# Patient Record
Sex: Male | Born: 1980
Health system: Southern US, Community
[De-identification: ages and names within clinical notes are randomized; demographics above are authoritative.]

## PROBLEM LIST (undated history)

## (undated) DIAGNOSIS — K760 Fatty (change of) liver, not elsewhere classified: Secondary | ICD-10-CM

## (undated) DIAGNOSIS — I10 Essential (primary) hypertension: Secondary | ICD-10-CM

## (undated) DIAGNOSIS — F411 Generalized anxiety disorder: Secondary | ICD-10-CM

## (undated) DIAGNOSIS — E119 Type 2 diabetes mellitus without complications: Secondary | ICD-10-CM

## (undated) DIAGNOSIS — E785 Hyperlipidemia, unspecified: Secondary | ICD-10-CM

## (undated) DIAGNOSIS — F988 Other specified behavioral and emotional disorders with onset usually occurring in childhood and adolescence: Secondary | ICD-10-CM

## (undated) DIAGNOSIS — R748 Abnormal levels of other serum enzymes: Secondary | ICD-10-CM

## (undated) HISTORY — DX: Type 2 diabetes mellitus without complications: E11.9

## (undated) HISTORY — DX: Abnormal levels of other serum enzymes: R74.8

---
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
## 2000-11-27 ENCOUNTER — Ambulatory Visit (HOSPITAL_COMMUNITY): Admission: RE | Admit: 2000-11-27 | Discharge: 2000-11-27 | Payer: Self-pay | Admitting: Family Medicine

## 2000-11-27 ENCOUNTER — Encounter: Payer: Self-pay | Admitting: Family Medicine

## 2003-09-20 ENCOUNTER — Emergency Department (HOSPITAL_COMMUNITY): Admission: EM | Admit: 2003-09-20 | Discharge: 2003-09-20 | Payer: Self-pay | Admitting: Emergency Medicine

## 2005-02-13 ENCOUNTER — Ambulatory Visit (HOSPITAL_COMMUNITY): Admission: RE | Admit: 2005-02-13 | Discharge: 2005-02-13 | Payer: Self-pay | Admitting: Family Medicine

## 2015-02-22 DIAGNOSIS — E785 Hyperlipidemia, unspecified: Secondary | ICD-10-CM

## 2015-02-22 DIAGNOSIS — E1169 Type 2 diabetes mellitus with other specified complication: Secondary | ICD-10-CM | POA: Insufficient documentation

## 2015-02-22 DIAGNOSIS — I10 Essential (primary) hypertension: Secondary | ICD-10-CM | POA: Insufficient documentation

## 2015-02-22 DIAGNOSIS — E1159 Type 2 diabetes mellitus with other circulatory complications: Secondary | ICD-10-CM | POA: Insufficient documentation

## 2015-02-22 DIAGNOSIS — F988 Other specified behavioral and emotional disorders with onset usually occurring in childhood and adolescence: Secondary | ICD-10-CM

## 2015-02-22 HISTORY — DX: Hyperlipidemia, unspecified: E78.5

## 2015-02-22 HISTORY — DX: Other specified behavioral and emotional disorders with onset usually occurring in childhood and adolescence: F98.8

## 2015-03-23 DIAGNOSIS — E663 Overweight: Secondary | ICD-10-CM | POA: Insufficient documentation

## 2015-03-23 DIAGNOSIS — K760 Fatty (change of) liver, not elsewhere classified: Secondary | ICD-10-CM

## 2015-03-23 DIAGNOSIS — E1165 Type 2 diabetes mellitus with hyperglycemia: Secondary | ICD-10-CM | POA: Insufficient documentation

## 2015-03-23 DIAGNOSIS — E669 Obesity, unspecified: Secondary | ICD-10-CM | POA: Insufficient documentation

## 2015-03-23 DIAGNOSIS — E785 Hyperlipidemia, unspecified: Secondary | ICD-10-CM

## 2015-03-23 DIAGNOSIS — E1169 Type 2 diabetes mellitus with other specified complication: Secondary | ICD-10-CM | POA: Insufficient documentation

## 2015-03-23 HISTORY — DX: Fatty (change of) liver, not elsewhere classified: K76.0

## 2016-02-15 DIAGNOSIS — F411 Generalized anxiety disorder: Secondary | ICD-10-CM | POA: Insufficient documentation

## 2016-02-15 HISTORY — DX: Generalized anxiety disorder: F41.1

## 2017-07-12 DIAGNOSIS — Z0000 Encounter for general adult medical examination without abnormal findings: Secondary | ICD-10-CM | POA: Diagnosis not present

## 2017-07-12 DIAGNOSIS — E785 Hyperlipidemia, unspecified: Secondary | ICD-10-CM | POA: Diagnosis not present

## 2017-07-12 DIAGNOSIS — R7309 Other abnormal glucose: Secondary | ICD-10-CM | POA: Diagnosis not present

## 2017-08-13 DIAGNOSIS — K760 Fatty (change of) liver, not elsewhere classified: Secondary | ICD-10-CM | POA: Diagnosis not present

## 2017-08-13 DIAGNOSIS — E785 Hyperlipidemia, unspecified: Secondary | ICD-10-CM | POA: Diagnosis not present

## 2017-08-13 DIAGNOSIS — I10 Essential (primary) hypertension: Secondary | ICD-10-CM | POA: Diagnosis not present

## 2017-08-13 DIAGNOSIS — F900 Attention-deficit hyperactivity disorder, predominantly inattentive type: Secondary | ICD-10-CM | POA: Diagnosis not present

## 2017-08-13 DIAGNOSIS — E1169 Type 2 diabetes mellitus with other specified complication: Secondary | ICD-10-CM | POA: Diagnosis not present

## 2017-10-31 DIAGNOSIS — J019 Acute sinusitis, unspecified: Secondary | ICD-10-CM | POA: Diagnosis not present

## 2017-10-31 DIAGNOSIS — I10 Essential (primary) hypertension: Secondary | ICD-10-CM | POA: Diagnosis not present

## 2017-10-31 DIAGNOSIS — J029 Acute pharyngitis, unspecified: Secondary | ICD-10-CM | POA: Diagnosis not present

## 2018-02-06 ENCOUNTER — Emergency Department (HOSPITAL_COMMUNITY): Payer: BLUE CROSS/BLUE SHIELD

## 2018-02-06 ENCOUNTER — Encounter (HOSPITAL_COMMUNITY): Payer: Self-pay | Admitting: Emergency Medicine

## 2018-02-06 ENCOUNTER — Emergency Department (HOSPITAL_COMMUNITY)
Admission: EM | Admit: 2018-02-06 | Discharge: 2018-02-06 | Disposition: A | Discharge status: Home / Self Care | Payer: BLUE CROSS/BLUE SHIELD | Attending: Emergency Medicine | Admitting: Emergency Medicine

## 2018-02-06 ENCOUNTER — Other Ambulatory Visit: Payer: Self-pay

## 2018-02-06 DIAGNOSIS — I10 Essential (primary) hypertension: Secondary | ICD-10-CM | POA: Diagnosis not present

## 2018-02-06 DIAGNOSIS — R42 Dizziness and giddiness: Secondary | ICD-10-CM | POA: Diagnosis not present

## 2018-02-06 DIAGNOSIS — N281 Cyst of kidney, acquired: Secondary | ICD-10-CM | POA: Diagnosis not present

## 2018-02-06 DIAGNOSIS — R509 Fever, unspecified: Secondary | ICD-10-CM | POA: Diagnosis not present

## 2018-02-06 DIAGNOSIS — R161 Splenomegaly, not elsewhere classified: Secondary | ICD-10-CM | POA: Insufficient documentation

## 2018-02-06 DIAGNOSIS — M546 Pain in thoracic spine: Secondary | ICD-10-CM | POA: Diagnosis not present

## 2018-02-06 DIAGNOSIS — R5383 Other fatigue: Secondary | ICD-10-CM | POA: Diagnosis not present

## 2018-02-06 HISTORY — DX: Essential (primary) hypertension: I10

## 2018-02-06 LAB — COMPREHENSIVE METABOLIC PANEL
ALK PHOS: 52 U/L (ref 38–126)
ALT: 100 U/L — AB (ref 0–44)
AST: 96 U/L — AB (ref 15–41)
Albumin: 3.9 g/dL (ref 3.5–5.0)
Anion gap: 9 (ref 5–15)
BUN: 8 mg/dL (ref 6–20)
CALCIUM: 9 mg/dL (ref 8.9–10.3)
CHLORIDE: 98 mmol/L (ref 98–111)
CO2: 27 mmol/L (ref 22–32)
CREATININE: 1.12 mg/dL (ref 0.61–1.24)
GFR calc non Af Amer: >60 mL/min (ref >60)
GLUCOSE: 129 mg/dL — AB (ref 70–99)
Potassium: 3.4 mmol/L — ABNORMAL LOW (ref 3.5–5.1)
SODIUM: 134 mmol/L — AB (ref 135–145)
Total Bilirubin: 1.3 mg/dL — ABNORMAL HIGH (ref 0.3–1.2)
Total Protein: 7.4 g/dL (ref 6.5–8.1)

## 2018-02-06 LAB — CBC WITH DIFFERENTIAL/PLATELET
BASOS ABS: 0.1 10*3/uL (ref 0.0–0.1)
BASOS PCT: 1 %
Eosinophils Absolute: 0.2 10*3/uL (ref 0.0–0.7)
Eosinophils Relative: 3 %
HEMATOCRIT: 42.3 % (ref 39.0–52.0)
HEMOGLOBIN: 14.4 g/dL (ref 13.0–17.0)
LYMPHS PCT: 45 %
Lymphs Abs: 2.7 10*3/uL (ref 0.7–4.0)
MCH: 29.8 pg (ref 26.0–34.0)
MCHC: 34 g/dL (ref 30.0–36.0)
MCV: 87.6 fL (ref 78.0–100.0)
MONO ABS: 0.6 10*3/uL (ref 0.1–1.0)
MONOS PCT: 9 %
NEUTROS ABS: 2.5 10*3/uL (ref 1.7–7.7)
NEUTROS PCT: 42 %
Platelets: 209 10*3/uL (ref 150–400)
RBC: 4.83 MIL/uL (ref 4.22–5.81)
RDW: 14.2 % (ref 11.5–15.5)
WBC: 6 10*3/uL (ref 4.0–10.5)

## 2018-02-06 LAB — URINALYSIS, ROUTINE W REFLEX MICROSCOPIC
Bilirubin Urine: NEGATIVE
GLUCOSE, UA: NEGATIVE mg/dL
Hgb urine dipstick: NEGATIVE
Ketones, ur: 5 mg/dL — AB
LEUKOCYTES UA: NEGATIVE
NITRITE: NEGATIVE
PH: 5 (ref 5.0–8.0)
PROTEIN: NEGATIVE mg/dL
Specific Gravity, Urine: 1.024 (ref 1.005–1.030)

## 2018-02-06 LAB — MONONUCLEOSIS SCREEN: Mono Screen: NEGATIVE

## 2018-02-06 LAB — LIPASE, BLOOD: LIPASE: 36 U/L (ref 11–51)

## 2018-02-06 MED ORDER — POVIDONE-IODINE 10 % EX SOLN
CUTANEOUS | Status: AC
  Filled 2018-02-06: qty 15

## 2018-02-06 MED ORDER — HYDROCODONE-ACETAMINOPHEN 5-325 MG PO TABS: 1.0000 | ORAL_TABLET | ORAL | 0 refills | Status: DC | PRN

## 2018-02-06 MED ORDER — IOPAMIDOL (ISOVUE-300) INJECTION 61%
100.0000 mL | Freq: Once | INTRAVENOUS | Status: AC | PRN
  Administered 2018-02-06: 100 mL via INTRAVENOUS

## 2018-02-06 MED ORDER — LIDOCAINE HCL (PF) 1 % IJ SOLN
INTRAMUSCULAR | Status: AC
  Filled 2018-02-06: qty 5

## 2018-02-06 NOTE — ED Notes (Signed)
Patient transported to CT 

## 2018-02-06 NOTE — ED Triage Notes (Addendum)
Pt reports lower back pain, left upper quadrant pain, intermittent dizziness, fever x2 weeks. Pt sent from pcp office. Highest reported 103.4. Last dose of tylenol 7am this am.  pcp office collected urine sample and pt reports "was negative".

## 2018-02-06 NOTE — ED Notes (Signed)
Patient reports he began feeling ill approx 10 days ago. C/O generalized weakness and fatigue. Has had fever since Tuesday a week ago. Patient was seen by PCP this am and sent for evaluation. Lungs sounds clear, denies cough. Patient with CVA tenderness, particularly on the L. Tender to abdominal palpation in the LUQ. Denies blood in urine or burning with urination.

## 2018-02-06 NOTE — ED Provider Notes (Signed)
[facility] EMERGENCY DEPARTMENT Provider Note   CSN: [id number] Arrival date & time: 02/06/18  1032     History   Chief Complaint Chief Complaint  Patient presents with  . Fever    HPI Randall Murphy is a 37 y.o. male.  The history is provided by the patient. No language interpreter was used.  Fever   This is a new problem. The problem occurs constantly. Progression since onset: 2 weeks. The maximum temperature noted was 100 to 100.9 F. Pertinent negatives include no chest pain, no sore throat and no cough. He has tried nothing for the symptoms. The treatment provided moderate relief.    Past Medical History:  Diagnosis Date  . Hypertension     There are no active problems to display for this patient.   History reviewed. No pertinent surgical history.      Home Medications    Prior to Admission medications   Medication Sig Start Date End Date Taking? Authorizing Provider  HYDROcodone-acetaminophen (NORCO/VICODIN) 5-325 MG tablet Take 1 tablet by mouth every 4 (four) hours as needed. 02/06/18   Elson Areas, PA-C    Family History History reviewed. No pertinent family history.  Social History Social History   Tobacco Use  . Smoking status: Never Smoker  . Smokeless tobacco: Never Used  Substance Use Topics  . Alcohol use: Yes    Comment: daily  . Drug use: Never     Allergies   Patient has no allergy information on record.   Review of Systems Review of Systems  Constitutional: Positive for fever.  HENT: Negative for sore throat.   Respiratory: Negative for cough.   Cardiovascular: Negative for chest pain.  All other systems reviewed and are negative.    Physical Exam Updated Vital Signs BP 123/82 (BP Location: Right Arm)   Pulse 85   Temp 100.3 F (37.9 C) (Oral)   Resp 16   Ht 5\' 9"  (1.753 m)   Wt 92.5 kg   SpO2 96%   BMI 30.13 kg/m   Physical Exam  Constitutional: He appears well-developed and well-nourished.  HENT:  Head:  Normocephalic and atraumatic.  Eyes: Conjunctivae are normal.  Neck: Normal range of motion. Neck supple.  Cardiovascular: Normal rate and regular rhythm.  No murmur heard. Pulmonary/Chest: Effort normal and breath sounds normal. No respiratory distress.  Abdominal: Soft. There is tenderness.  Tender left upper quadrant to palpation,    Musculoskeletal: Normal range of motion. He exhibits no edema.  Neurological: He is alert.  Skin: Skin is warm and dry.  Psychiatric: He has a normal mood and affect.  Nursing note and vitals reviewed.    ED Treatments / Results  Labs (all labs ordered are listed, but only abnormal results are displayed) Labs Reviewed  URINALYSIS, ROUTINE W REFLEX MICROSCOPIC - Abnormal; Notable for the following components:      Result Value   Color, Urine AMBER (*)    Ketones, ur 5 (*)    All other components within normal limits  COMPREHENSIVE METABOLIC PANEL - Abnormal; Notable for the following components:   Sodium 134 (*)    Potassium 3.4 (*)    Glucose, Bld 129 (*)    AST 96 (*)    ALT 100 (*)    Total Bilirubin 1.3 (*)    All other components within normal limits  CBC WITH DIFFERENTIAL/PLATELET  LIPASE, BLOOD  MONONUCLEOSIS SCREEN    EKG None  Radiology Ct Abdomen Pelvis W Contrast  Result  Date: 02/06/2018 CLINICAL DATA:  Low back pain, left upper quadrant pain, intermittent dizziness and fever for 2 weeks. EXAM: CT ABDOMEN AND PELVIS WITH CONTRAST TECHNIQUE: Multidetector CT imaging of the abdomen and pelvis was performed using the standard protocol following bolus administration of intravenous contrast. CONTRAST:  ISOVUE-300 IOPAMIDOL (ISOVUE-300) INJECTION 61% COMPARISON:  None. FINDINGS: Lower chest: The lung bases are clear of acute process. No pleural effusion or pulmonary lesions. The heart is normal in size. No pericardial effusion. The distal esophagus and aorta are unremarkable. Hepatobiliary: No focal hepatic lesions or intrahepatic  biliary dilatation. The gallbladder is normal. No common bile duct dilatation. Pancreas: No mass, inflammation or ductal dilatation. Spleen: Moderate splenomegaly. The spleen measures 15.5 x 14.0 x 7.4 cm. Splenic volume is 803 cubic cm. No focal splenic lesions. Adrenals/Urinary Tract: The adrenal glands and kidneys are unremarkable. There is a simple appearing cyst projecting off the lower pole region of the right kidney posteriorly. No renal, ureteral or bladder calculi. Mild bladder wall thickening may be due to lack of distension. Stomach/Bowel: The stomach, duodenum, small bowel and colon are grossly normal without oral contrast. No inflammatory changes, mass lesions or obstructive findings. The terminal ileum and appendix are normal. Vascular/Lymphatic: The aorta is normal in caliber. No dissection. The branch vessels are patent. The major venous structures are patent. No mesenteric or retroperitoneal mass or adenopathy. Small scattered lymph nodes are noted. Reproductive: The prostate gland and seminal vesicles are unremarkable. Other: No pelvic mass or adenopathy. No free pelvic fluid collections. No inguinal mass or adenopathy. No abdominal wall hernia or subcutaneous lesions. Musculoskeletal: No significant bony findings. IMPRESSION: 1. No acute abdominal/pelvic findings, mass lesions or lymphadenopathy. 2. Splenomegaly. 3. Simple appearing right renal cyst. 4. Mild uniform bladder wall thickening likely due to lack of distension. Electronically Signed   By: Rudie Meyer M.D.   On: 02/06/2018 14:18    Procedures Procedures (including critical care time)  Medications Ordered in ED Medications  iopamidol (ISOVUE-300) 61 % injection 100 mL (100 mLs Intravenous Contrast Given 02/06/18 1342)     Initial Impression / Assessment and Plan / ED Course  I have reviewed the triage vital signs and the nursing notes.  Pertinent labs & imaging results that were available during my care of the patient  were reviewed by me and considered in my medical decision making (see chart for details).     MDM  Ct scan shows enlarged spleen.  Mono is negative,  CBC shows normal platelets.  Cmet shows elevated tbili, ast and alt are elevated   I discussed the pt with Dr. Malachy Mood.   Oncology consulted.   Dr. Melton Alar advised she will see the pt tomorrow for further evaluation.     Pt counseled on results and need for follow up.  Final Clinical Impressions(s) / ED Diagnoses   Final diagnoses:  Splenomegaly    ED Discharge Orders         Ordered    HYDROcodone-acetaminophen (NORCO/VICODIN) 5-325 MG tablet  Every 4 hours PRN     02/06/18 1555        An After Visit Summary was printed and given to the patient.    Elson Areas, PA-C 02/06/18 1700    Samuel Jester, DO 02/10/18 1334

## 2018-02-07 ENCOUNTER — Encounter (HOSPITAL_COMMUNITY): Payer: Self-pay | Admitting: Internal Medicine

## 2018-02-07 ENCOUNTER — Inpatient Hospital Stay (HOSPITAL_COMMUNITY): Payer: BLUE CROSS/BLUE SHIELD | Attending: Internal Medicine | Admitting: Internal Medicine

## 2018-02-07 ENCOUNTER — Inpatient Hospital Stay (HOSPITAL_COMMUNITY): Payer: BLUE CROSS/BLUE SHIELD

## 2018-02-07 VITALS — BP 113/73 | HR 74 | Temp 98.3°F | Resp 16 | Ht 69.0 in | Wt 201.5 lb

## 2018-02-07 DIAGNOSIS — I10 Essential (primary) hypertension: Secondary | ICD-10-CM | POA: Diagnosis not present

## 2018-02-07 DIAGNOSIS — R945 Abnormal results of liver function studies: Secondary | ICD-10-CM

## 2018-02-07 DIAGNOSIS — R7989 Other specified abnormal findings of blood chemistry: Secondary | ICD-10-CM | POA: Diagnosis not present

## 2018-02-07 DIAGNOSIS — R161 Splenomegaly, not elsewhere classified: Secondary | ICD-10-CM | POA: Insufficient documentation

## 2018-02-07 DIAGNOSIS — Z7289 Other problems related to lifestyle: Secondary | ICD-10-CM | POA: Diagnosis not present

## 2018-02-07 DIAGNOSIS — K760 Fatty (change of) liver, not elsewhere classified: Secondary | ICD-10-CM

## 2018-02-07 NOTE — Patient Instructions (Signed)
[facility] Discharge Instructions  You saw Dr. Higgs today.   Thank you for choosing [facility] to provide your oncology and hematology care.  To afford each patient quality time with our provider, please arrive at least 15 minutes before your scheduled appointment time.   If you have a lab appointment with the Cancer Center please come in thru the  Main Entrance and check in at the main information desk  You need to re-schedule your appointment should you arrive 10 or more minutes late.  We strive to give you quality time with our providers, and arriving late affects you and other patients whose appointments are after yours.  Also, if you no show three or more times for appointments you may be dismissed from the clinic at the providers discretion.     Again, thank you for choosing [facility].  Our hope is that these requests will decrease the amount of time that you wait before being seen by our physicians.       _____________________________________________________________  Should you have questions after your visit to [facility], please contact our office at [phone] between the hours of 8:00 a.m. and 4:30 p.m.  Voicemails left after 4:00 p.m. will not be returned until the following business day.  For prescription refill requests, have your pharmacy contact our office and allow 72 hours.    Cancer Center Support Programs:   > Cancer Support Group  2nd Tuesday of the month 1pm-2pm, Journey Room    

## 2018-02-07 NOTE — Progress Notes (Signed)
Referring physician:  Emergency department  Diagnosis Fatty liver  Abnormal liver function tests - Plan: Hepatitis panel, acute, Hepatitis B surface antibody, Hepatitis B surface antigen, Hepatitis B core antibody, total, Hepatitis C RNA quantitative, HIV antibody (with reflex)  Staging Cancer Staging No matching staging information was found for the patient.  Assessment and Plan: 1.  Splenomegaly.  When questioned the patient has a history of excessive alcohol use.  His liver function tests are elevated with a AST of 96 ALT of 100.  I suspect the splenomegaly is related to his use of alcohol especially due to the elevation in his liver function tests.  He is referred to GI for evaluation.  He will need to follow-up with his primary care physician for help with cutting back his alcohol use.  He will return to clinic to go over the results of his lab studies.  Will consider repeat imaging once his alcohol use has been cut back and he has been seen by GI.  2.  Abnormal liver function tests.  It was noted his AST was 96 ALT was 100.  He reports frequent alcohol use.  Patient is referred to GI for evaluation.  Hepatitis and HIV testing was negative.  3.  Alcohol abuse.  He should follow-up with his primary care physician for help with cutting back on alcohol use.  This is likely the etiology of his elevated liver function tests and could also contribute to the splenomegaly.  4.  Hypertension.  Blood pressure is 113/73.  Follow-up with PCP.  Greater than 40 minutes spent with more than 50% spent in counseling and coordination of care.  HPI: 37 year old male who presented to the emergency room complaining of fevers.  Patient was afebrile in the emergency room.  Labs done 02/06/2018 showed a white count of 6000 hemoglobin 14.4 platelets 209,000 chemistries within normal limits with a creatinine of 1.12 his liver function tests were elevated at 96.  When questioned he reports a history of frequent  alcohol use.  He had a CT of the abdomen and pelvis done 02/06/2018 that showed no adenopathy but he had evidence of mild splenomegaly.  Patient is seen today for consultation due to splenomegaly.   Problem List Patient Active Problem List   Diagnosis Date Noted  . Anxiety, generalized [F41.1] 02/15/2016  . DM type 2 with diabetic dyslipidemia ([place]) [E11.69, E78.5] 03/23/2015  . Fatty liver [K76.0] 03/23/2015  . Obesity (BMI 30-39.9) [E66.9] 03/23/2015  . ADD (attention deficit disorder) [F98.8] 02/22/2015  . Dyslipidemia [E78.5] 02/22/2015  . Essential hypertension [I10] 02/22/2015    Past Medical History Past Medical History:  Diagnosis Date  . Diabetes mellitus without complication ([place])   . Hypertension     Past Surgical History History reviewed. No pertinent surgical history.  Family History History reviewed. No pertinent family history.   Social History  reports that he has never smoked. He has never used smokeless tobacco. He reports that he drinks alcohol. He reports that he does not use drugs.  Medications  Current Outpatient Medications:  .  amLODipine (NORVASC) 10 MG tablet, TAKE 1 TABLET BY MOUTH EVERY DAY *PATIENT DUE FOR OFFICE VISIT*, Disp: , Rfl: 3 .  Blood Glucose Monitoring Suppl (ACCU-CHEK AVIVA PLUS) w/Device KIT, USE AS DIRECTED., Disp: , Rfl:  .  Blood Glucose Monitoring Suppl DEVI, Use as directed. DX-E11.9  Pharmacy, please dispense this brand of blood glucose monitoring device: Other: accu-check one touch, Disp: , Rfl:  .  escitalopram (LEXAPRO) 5  MG tablet, TAKE TWO TABLETS (10 MG DOSE) BY MOUTH DAILY., Disp: , Rfl: 2 .  glucose blood test strip, Use as directed. DX E11.9 Pharmacy, please dispense this brand of blood glucose test strips: Accu-Chek Aviva Plus, Disp: , Rfl:  .  HYDROcodone-acetaminophen (NORCO/VICODIN) 5-325 MG tablet, Take 1 tablet by mouth every 4 (four) hours as needed., Disp: 10 tablet, Rfl: 0 .  Lancets (ACCU-CHEK SOFT TOUCH)  lancets, Use as directed. DX E11.9, Disp: , Rfl:  .  VYVANSE 50 MG capsule, TAKE ONE CAPSULE (50 MG DOSE) BY MOUTH EVERY MORNING., Disp: , Rfl: 0  Allergies Patient has no known allergies.  Review of Systems Review of Systems - Oncology ROS negative other than fevers and abdominal pain   Physical Exam  Vitals Wt Readings from Last 3 Encounters:  02/07/18 201 lb 8 oz (91.4 kg)  02/06/18 204 lb (92.5 kg)   Temp Readings from Last 3 Encounters:  02/07/18 98.3 F (36.8 C) (Oral)  02/06/18 100.3 F (37.9 C) (Oral)   BP Readings from Last 3 Encounters:  02/07/18 113/73  02/06/18 123/82   Pulse Readings from Last 3 Encounters:  02/07/18 74  02/06/18 85   Constitutional: Well-developed, well-nourished, and in no distress.   HENT: Head: Normocephalic and atraumatic.  Mouth/Throat: No oropharyngeal exudate. Mucosa moist. Eyes: Pupils are equal, round, and reactive to light. Conjunctivae are normal. No scleral icterus.  Neck: Normal range of motion. Neck supple. No JVD present.  Cardiovascular: Normal rate, regular rhythm and normal heart sounds.  Exam reveals no gallop and no friction rub.   No murmur heard. Pulmonary/Chest: Effort normal and breath sounds normal. No respiratory distress. No wheezes.No rales.  Abdominal: Soft. Bowel sounds are normal. No distension. Tender in epigastric region.  There is no guarding.  Musculoskeletal: No edema or tenderness.  Lymphadenopathy: No cervical, axillary or supraclavicular adenopathy.  Neurological: Alert and oriented to person, place, and time. No cranial nerve deficit.  Skin: Skin is warm and dry. No rash noted. No erythema. No pallor.  Psychiatric: Affect and judgment normal.   Labs Admission on 02/06/2018, Discharged on 02/06/2018  Component Date Value Ref Range Status  . Color, Urine 02/06/2018 AMBER* YELLOW Final   BIOCHEMICALS MAY BE AFFECTED BY COLOR  . APPearance 02/06/2018 CLEAR  CLEAR Final  . Specific Gravity, Urine  02/06/2018 1.024  1.005 - 1.030 Final  . pH 02/06/2018 5.0  5.0 - 8.0 Final  . Glucose, UA 02/06/2018 NEGATIVE  NEGATIVE mg/dL Final  . Hgb urine dipstick 02/06/2018 NEGATIVE  NEGATIVE Final  . Bilirubin Urine 02/06/2018 NEGATIVE  NEGATIVE Final  . Ketones, ur 02/06/2018 5* NEGATIVE mg/dL Final  . Protein, ur 02/06/2018 NEGATIVE  NEGATIVE mg/dL Final  . Nitrite 02/06/2018 NEGATIVE  NEGATIVE Final  . Leukocytes, UA 02/06/2018 NEGATIVE  NEGATIVE Final   Performed at [facility], [address], [place], [place] [address]  . WBC 02/06/2018 6.0  4.0 - 10.5 K/uL Final  . RBC 02/06/2018 4.83  4.22 - 5.81 MIL/uL Final  . Hemoglobin 02/06/2018 14.4  13.0 - 17.0 g/dL Final  . HCT 02/06/2018 42.3  39.0 - 52.0 % Final  . MCV 02/06/2018 87.6  78.0 - 100.0 fL Final  . MCH 02/06/2018 29.8  26.0 - 34.0 pg Final  . MCHC 02/06/2018 34.0  30.0 - 36.0 g/dL Final  . RDW 02/06/2018 14.2  11.5 - 15.5 % Final  . Platelets 02/06/2018 209  150 - 400 K/uL Final  . Neutrophils Relative %  02/06/2018 42  % Final  . Neutro Abs 02/06/2018 2.5  1.7 - 7.7 K/uL Final  . Lymphocytes Relative 02/06/2018 45  % Final  . Lymphs Abs 02/06/2018 2.7  0.7 - 4.0 K/uL Final  . Monocytes Relative 02/06/2018 9  % Final  . Monocytes Absolute 02/06/2018 0.6  0.1 - 1.0 K/uL Final  . Eosinophils Relative 02/06/2018 3  % Final  . Eosinophils Absolute 02/06/2018 0.2  0.0 - 0.7 K/uL Final  . Basophils Relative 02/06/2018 1  % Final  . Basophils Absolute 02/06/2018 0.1  0.0 - 0.1 K/uL Final  . WBC Morphology 02/06/2018 WHITE COUNT CONFIRMED ON SMEAR   Final   Performed at [facility], [address] [place], [place] [address]  . Lipase 02/06/2018 36  11 - 51 U/L Final   Performed at [facility], [address], [place], [place] [address]  . Sodium 02/06/2018 134* 135 - 145 mmol/L Final  . Potassium 02/06/2018 3.4* 3.5 - 5.1 mmol/L Final  . Chloride 02/06/2018 98  98 - 111 mmol/L Final  . CO2 02/06/2018 27  22 - 32 mmol/L Final  .  Glucose, Bld 02/06/2018 129* 70 - 99 mg/dL Final  . BUN 02/06/2018 8  6 - 20 mg/dL Final  . Creatinine, Ser 02/06/2018 1.12  0.61 - 1.24 mg/dL Final  . Calcium 02/06/2018 9.0  8.9 - 10.3 mg/dL Final  . Total Protein 02/06/2018 7.4  6.5 - 8.1 g/dL Final  . Albumin 02/06/2018 3.9  3.5 - 5.0 g/dL Final  . AST 02/06/2018 96* 15 - 41 U/L Final  . ALT 02/06/2018 100* 0 - 44 U/L Final  . Alkaline Phosphatase 02/06/2018 52  38 - 126 U/L Final  . Total Bilirubin 02/06/2018 1.3* 0.3 - 1.2 mg/dL Final  . GFR calc non Af Amer 02/06/2018 >60  >60 mL/min Final  . GFR calc Af Amer 02/06/2018 >60  >60 mL/min Final   Comment: (NOTE) The eGFR has been calculated using the CKD EPI equation. This calculation has not been validated in all clinical situations. eGFR's persistently <60 mL/min signify possible Chronic Kidney Disease.   Georgiann Hahn gap 02/06/2018 9  5 - 15 Final   Performed at [facility], [address] [place], [place] [address]  . Mono Screen 02/06/2018 NEGATIVE  NEGATIVE Final   Performed at [facility], [address], [place],  [address]     Pathology Orders Placed This Encounter  Procedures  . Hepatitis panel, acute    Standing Status:   Future    Number of Occurrences:   1    Standing Expiration Date:   02/08/2019  . Hepatitis B surface antibody    Standing Status:   Future    Number of Occurrences:   1    Standing Expiration Date:   02/08/2019  . Hepatitis B surface antigen    Standing Status:   Future    Number of Occurrences:   1    Standing Expiration Date:   02/08/2019  . Hepatitis B core antibody, total    Standing Status:   Future    Number of Occurrences:   1    Standing Expiration Date:   02/08/2019  . Hepatitis C RNA quantitative    Standing Status:   Future    Number of Occurrences:   1    Standing Expiration Date:   02/08/2019  . HIV antibody (with reflex)    Standing Status:   Future    Number of  Occurrences:   1    Standing Expiration Date:   02/08/2019        Zoila Shutter MD

## 2018-02-08 LAB — HEPATITIS B CORE ANTIBODY, TOTAL: HEP B C TOTAL AB: NEGATIVE

## 2018-02-08 LAB — HEPATITIS PANEL, ACUTE
HCV Ab: <0.1 s/co ratio (ref 0.0–0.9)
Hep A IgM: NEGATIVE
Hep B C IgM: NEGATIVE
Hepatitis B Surface Ag: NEGATIVE

## 2018-02-08 LAB — HEPATITIS B SURFACE ANTIBODY,QUALITATIVE: Hep B S Ab: NONREACTIVE

## 2018-02-08 LAB — HEPATITIS B SURFACE ANTIGEN: HEP B S AG: NEGATIVE

## 2018-02-08 LAB — HIV ANTIBODY (ROUTINE TESTING W REFLEX): HIV SCREEN 4TH GENERATION: NONREACTIVE

## 2018-02-12 DIAGNOSIS — R161 Splenomegaly, not elsewhere classified: Secondary | ICD-10-CM | POA: Diagnosis not present

## 2018-02-12 DIAGNOSIS — I10 Essential (primary) hypertension: Secondary | ICD-10-CM | POA: Diagnosis not present

## 2018-02-12 DIAGNOSIS — R509 Fever, unspecified: Secondary | ICD-10-CM | POA: Diagnosis not present

## 2018-02-13 DIAGNOSIS — R509 Fever, unspecified: Secondary | ICD-10-CM | POA: Diagnosis not present

## 2018-02-13 DIAGNOSIS — R05 Cough: Secondary | ICD-10-CM | POA: Diagnosis not present

## 2018-02-13 DIAGNOSIS — R634 Abnormal weight loss: Secondary | ICD-10-CM | POA: Diagnosis not present

## 2018-02-13 DIAGNOSIS — M2550 Pain in unspecified joint: Secondary | ICD-10-CM | POA: Diagnosis not present

## 2018-02-19 ENCOUNTER — Encounter: Payer: Self-pay | Admitting: Internal Medicine

## 2018-03-01 ENCOUNTER — Other Ambulatory Visit (HOSPITAL_COMMUNITY): Payer: BLUE CROSS/BLUE SHIELD

## 2018-03-06 DIAGNOSIS — Z23 Encounter for immunization: Secondary | ICD-10-CM | POA: Diagnosis not present

## 2018-03-08 ENCOUNTER — Ambulatory Visit (HOSPITAL_COMMUNITY): Payer: BLUE CROSS/BLUE SHIELD | Admitting: Internal Medicine

## 2018-05-15 ENCOUNTER — Ambulatory Visit: Payer: BLUE CROSS/BLUE SHIELD | Admitting: Nurse Practitioner

## 2018-05-15 ENCOUNTER — Encounter: Payer: Self-pay | Admitting: Nurse Practitioner

## 2018-05-15 ENCOUNTER — Telehealth: Payer: Self-pay | Admitting: Internal Medicine

## 2018-05-15 NOTE — Telephone Encounter (Signed)
PATIENT WAS A NO SHOW AND LETTER SENT  °

## 2018-05-15 NOTE — Progress Notes (Deleted)
Primary Care Physician:  Dione Housekeeper, MD Primary Gastroenterologist:  Dr.   Rayne Du chief complaint on file.   HPI:   Randall Murphy is a 37 y.o. male who presents on referral from primary care for abnormal liver in the setting of heavy alcohol use.  Reviewed information provided with referral including ***.   No history of colonoscopy or endoscopy in our system.  CT the abdomen and pelvis completed 02/06/2018 which found no acute abnormality, splenomegaly.  The patient was seen in the emergency department 02/06/2018 for splenomegaly.  He was mildly febrile at that time with tender abdomen.  Several labs were completed on 02/06/2018.  CMP found elevated AST/ALT at 96/100, normal alkaline phosphatase, elevated total bilirubin at 1.3.  CBC was normal including a platelet count of 209.  HIV antibody was negative.  Acute hepatitis panel found negative hepatitis A IgM, negative hepatitis C antibody.  Hepatitis B surface antibody/surface antigen/core antibody all negative.  Mononucleosis was negative.  The patient was referred to oncology.  The patient was seen by oncology 02/07/2018 for splenomegaly and upon questioning the patient admitted to history of excessive alcohol use.  He was subsequently referred to GI and primary care.  Today he states   Past Medical History:  Diagnosis Date  . Diabetes mellitus without complication ([place])   . Hypertension     No past surgical history on file.  Current Outpatient Medications  Medication Sig Dispense Refill  . amLODipine (NORVASC) 10 MG tablet TAKE 1 TABLET BY MOUTH EVERY DAY *PATIENT DUE FOR OFFICE VISIT*  3  . Blood Glucose Monitoring Suppl (ACCU-CHEK AVIVA PLUS) w/Device KIT USE AS DIRECTED.    Marland Kitchen Blood Glucose Monitoring Suppl DEVI Use as directed. DX-E11.9  Pharmacy, please dispense this brand of blood glucose monitoring device: Other: accu-check one touch    . escitalopram (LEXAPRO) 5 MG tablet TAKE TWO TABLETS (10 MG DOSE) BY MOUTH DAILY.   2  . glucose blood test strip Use as directed. DX E11.9 Pharmacy, please dispense this brand of blood glucose test strips: Accu-Chek Aviva Plus    . HYDROcodone-acetaminophen (NORCO/VICODIN) 5-325 MG tablet Take 1 tablet by mouth every 4 (four) hours as needed. 10 tablet 0  . Lancets (ACCU-CHEK SOFT TOUCH) lancets Use as directed. DX E11.9    . VYVANSE 50 MG capsule TAKE ONE CAPSULE (50 MG DOSE) BY MOUTH EVERY MORNING.  0   No current facility-administered medications for this visit.     Allergies as of 05/15/2018  . (No Known Allergies)    No family history on file.  Social History   Socioeconomic History  . Marital status: Divorced    Spouse name: Not on file  . Number of children: 2  . Years of education: Not on file  . Highest education level: Not on file  Occupational History  . Not on file  Social Needs  . Financial resource strain: Not on file  . Food insecurity:    Worry: Not on file    Inability: Not on file  . Transportation needs:    Medical: Not on file    Non-medical: Not on file  Tobacco Use  . Smoking status: Never Smoker  . Smokeless tobacco: Never Used  Substance and Sexual Activity  . Alcohol use: Yes    Comment: daily  . Drug use: Never  . Sexual activity: Yes  Lifestyle  . Physical activity:    Days per week: Not on file    Minutes per  session: Not on file  . Stress: Not on file  Relationships  . Social connections:    Talks on phone: Not on file    Gets together: Not on file    Attends religious service: Not on file    Active member of club or organization: Not on file    Attends meetings of clubs or organizations: Not on file    Relationship status: Not on file  . Intimate partner violence:    Fear of current or ex partner: Not on file    Emotionally abused: Not on file    Physically abused: Not on file    Forced sexual activity: Not on file  Other Topics Concern  . Not on file  Social History Narrative  . Not on file    Review of  Systems: General: Negative for anorexia, weight loss, fever, chills, fatigue, weakness. Eyes: Negative for vision changes.  ENT: Negative for hoarseness, difficulty swallowing , nasal congestion. CV: Negative for chest pain, angina, palpitations, dyspnea on exertion, peripheral edema.  Respiratory: Negative for dyspnea at rest, dyspnea on exertion, cough, sputum, wheezing.  GI: See history of present illness. GU:  Negative for dysuria, hematuria, urinary incontinence, urinary frequency, nocturnal urination.  MS: Negative for joint pain, low back pain.  Derm: Negative for rash or itching.  Neuro: Negative for weakness, abnormal sensation, seizure, frequent headaches, memory loss, confusion.  Psych: Negative for anxiety, depression, suicidal ideation, hallucinations.  Endo: Negative for unusual weight change.  Heme: Negative for bruising or bleeding. Allergy: Negative for rash or hives.    Physical Exam: There were no vitals taken for this visit. General:   Alert and oriented. Pleasant and cooperative. Well-nourished and well-developed.  Head:  Normocephalic and atraumatic. Eyes:  Without icterus, sclera clear and conjunctiva pink.  Ears:  Normal auditory acuity. Mouth:  No deformity or lesions, oral mucosa pink.  Throat/Neck:  Supple, without mass or thyromegaly. Cardiovascular:  S1, S2 present without murmurs appreciated. Normal pulses noted. Extremities without clubbing or edema. Respiratory:  Clear to auscultation bilaterally. No wheezes, rales, or rhonchi. No distress.  Gastrointestinal:  +BS, soft, non-tender and non-distended. No HSM noted. No guarding or rebound. No masses appreciated.  Rectal:  Deferred  Musculoskalatal:  Symmetrical without gross deformities. Normal posture. Skin:  Intact without significant lesions or rashes. Neurologic:  Alert and oriented x4;  grossly normal neurologically. Psych:  Alert and cooperative. Normal mood and affect. Heme/Lymph/Immune: No  significant cervical adenopathy. No excessive bruising noted.    05/15/2018 7:27 AM   Disclaimer: This note was dictated with voice recognition software. Similar sounding words can inadvertently be transcribed and may not be corrected upon review.

## 2018-06-11 DIAGNOSIS — K760 Fatty (change of) liver, not elsewhere classified: Secondary | ICD-10-CM | POA: Diagnosis not present

## 2018-06-11 DIAGNOSIS — E669 Obesity, unspecified: Secondary | ICD-10-CM | POA: Diagnosis not present

## 2018-06-11 DIAGNOSIS — E785 Hyperlipidemia, unspecified: Secondary | ICD-10-CM | POA: Diagnosis not present

## 2018-06-11 DIAGNOSIS — F900 Attention-deficit hyperactivity disorder, predominantly inattentive type: Secondary | ICD-10-CM | POA: Diagnosis not present

## 2019-02-13 ENCOUNTER — Ambulatory Visit: Payer: BC Managed Care – PPO | Admitting: Family Medicine

## 2019-02-13 ENCOUNTER — Other Ambulatory Visit: Payer: Self-pay

## 2019-02-13 ENCOUNTER — Encounter: Payer: Self-pay | Admitting: Family Medicine

## 2019-02-13 VITALS — BP 113/69 | HR 83 | Temp 98.9°F | Ht 69.0 in | Wt 207.0 lb

## 2019-02-13 DIAGNOSIS — E669 Obesity, unspecified: Secondary | ICD-10-CM | POA: Diagnosis not present

## 2019-02-13 DIAGNOSIS — F900 Attention-deficit hyperactivity disorder, predominantly inattentive type: Secondary | ICD-10-CM | POA: Diagnosis not present

## 2019-02-13 DIAGNOSIS — F411 Generalized anxiety disorder: Secondary | ICD-10-CM | POA: Diagnosis not present

## 2019-02-13 DIAGNOSIS — E785 Hyperlipidemia, unspecified: Secondary | ICD-10-CM | POA: Diagnosis not present

## 2019-02-13 DIAGNOSIS — Z23 Encounter for immunization: Secondary | ICD-10-CM

## 2019-02-13 DIAGNOSIS — I10 Essential (primary) hypertension: Secondary | ICD-10-CM | POA: Diagnosis not present

## 2019-02-13 DIAGNOSIS — E1169 Type 2 diabetes mellitus with other specified complication: Secondary | ICD-10-CM

## 2019-02-13 DIAGNOSIS — K760 Fatty (change of) liver, not elsewhere classified: Secondary | ICD-10-CM

## 2019-02-13 LAB — BAYER DCA HB A1C WAIVED: HB A1C (BAYER DCA - WAIVED): 8.1 % — ABNORMAL HIGH (ref <7.0)

## 2019-02-13 MED ORDER — ESCITALOPRAM OXALATE 10 MG PO TABS: 10.0000 mg | ORAL_TABLET | Freq: Every day | ORAL | 0 refills | Status: DC

## 2019-02-13 MED ORDER — ATOMOXETINE HCL 40 MG PO CAPS: 40.0000 mg | ORAL_CAPSULE | Freq: Every day | ORAL | 0 refills | Status: DC

## 2019-02-13 MED ORDER — AMLODIPINE BESYLATE 10 MG PO TABS: 10.0000 mg | ORAL_TABLET | Freq: Every day | ORAL | 1 refills | Status: DC

## 2019-02-13 NOTE — Progress Notes (Signed)
New Patient Office Visit  Assessment & Plan:  1. Attention deficit hyperactivity disorder (ADHD), predominantly inattentive type - Patient has not been doing well getting tasks done at work or at home since he has been out of the Vyvanse. He is agreeable in trying Strattera. Discussed starting at 40 mg QD but that it can be increased if needed.  - atomoxetine (STRATTERA) 40 MG capsule; Take 1 capsule (40 mg total) by mouth daily.  Dispense: 30 capsule; Refill: 0 - CMP14+EGFR  2. Anxiety, generalized - Uncontrolled. Patient is going to increase Lexapro from 5 mg to 10 mg and start taking it at night versus the morning since it makes him tired.  - escitalopram (LEXAPRO) 10 MG tablet; Take 1 tablet (10 mg total) by mouth at bedtime.  Dispense: 30 tablet; Refill: 0 - CMP14+EGFR  3. DM type 2 with diabetic dyslipidemia ([place]) - Uncontrolled. Diabetes is not at goal of A1c < 7; A1c 8.1 today.  - Medications: patient has never been on medication for diabetes. He is determined to get A1c down with diet and exercises versus starting medication. We discussed that if he returns and his A1c is still > 7.0 he will need to start medication; he is agreeable to this. - Patient is not currently taking a statin. Patient is not taking an ACE-inhibitor/ARB.  - Last foot exam: 02/13/2019 - Last diabetic eye exam: never - patient is going to schedule an appointment. - Urine Microalbumin/Creat Ratio: 02/13/2019 - Bayer DCA Hb A1c Waived - Microalbumin / creatinine urine ratio - CBC with Differential/Platelet - CMP14+EGFR - Lipid panel - Instruction/counseling given: reminded to get eye exam, discussed foot care, discussed the need for weight loss, discussed diet and provided printed educational material  4. Essential hypertension - Well controlled on current regimen.  - CMP14+EGFR - Lipid panel - amLODipine (NORVASC) 10 MG tablet; Take 1 tablet (10 mg total) by mouth daily.  Dispense: 90 tablet; Refill:  1  5. Dyslipidemia - Education provided on diet and exercise.  - CMP14+EGFR - Lipid panel  6. Fatty liver - CMP14+EGFR  7. Obesity (BMI 30-39.9) - Education provided on diet and exercise. Exercise goal is 30 minutes five days a week.  - CBC with Differential/Platelet - Lipid panel   Follow-up: Return in about 3 months (around 05/15/2019) for DM, ADD.   Hendricks Limes, MSN, APRN, FNP-C [facility]  Subjective:  [name], male    DOB: 25-Sep-1980  Age: 38 y.o. MRN: [id number]  Patient Care Team: Loman Brooklyn, FNP as PCP - General (Family Medicine) Gala Romney Cristopher Estimable, MD as Consulting Physician (Gastroenterology)  CC:  Chief Complaint  Patient presents with   New Patient (Initial Visit)    HPI Randall Murphy presents to establish care. He is transferring care from Dr. Murrell Redden office as he has retired and the office has closed. Patient also is in need of medication refills.  Anxiety: Patient does feel he could use the 10 mg of Lexapro but has not been taking it because it makes him tired. He does currently take his medication in the morning.  Depression screen [facility] 2/9 02/13/2019  Decreased Interest 0  Down, Depressed, Hopeless 0  PHQ - 2 Score 0  Altered sleeping 0  Tired, decreased energy 0  Change in appetite 0  Feeling bad or failure about yourself  0  Trouble concentrating 0  Moving slowly or fidgety/restless 0  Suicidal thoughts 0  PHQ-9 Score 0  GAD 7 : Generalized Anxiety Score 02/13/2019  Nervous, Anxious, on Edge 0  Control/stop worrying 0  Worry too much - different things 0  Trouble relaxing 0  Restless 3  Easily annoyed or irritable 2  Afraid - awful might happen 0  Total GAD 7 Score 5  Anxiety Difficulty Very difficult    ADD: Patient has been out of Vyvanse for two months. He is having a hard time getting stuff done, he does not complete tasks, it is hard to focus, and he often jumps from task to task.  He is currently working from home; he works in [employer], technical sales.   Diabetes: Patient presents for follow up of diabetes. Current symptoms include: none. Known diabetic complications: none. Cardiovascular risk factors: diabetes mellitus, dyslipidemia, hypertension, male gender and obesity (BMI >= 30 kg/m2). Current diabetic medications include none. Eye exam current (within one year): no. Weight trend: stable. Prior visit with dietician: no. Current diet: in general, an "unhealthy" diet. Current exercise: resistance training that he started doing a week ago. Current monitoring regimen: none. Is he on ACE inhibitor or angiotensin II receptor blocker? No.   Review of Systems  Constitutional: Negative for chills, fever, malaise/fatigue and weight loss.  HENT: Negative for congestion, ear discharge, ear pain, nosebleeds, sinus pain, sore throat and tinnitus.   Eyes: Negative for blurred vision, double vision, pain, discharge and redness.  Respiratory: Negative for cough, shortness of breath and wheezing.   Cardiovascular: Negative for chest pain, palpitations and leg swelling.  Gastrointestinal: Negative for abdominal pain, constipation, diarrhea, heartburn, nausea and vomiting.  Genitourinary: Negative for dysuria, frequency and urgency.  Musculoskeletal: Negative for myalgias.  Skin: Negative for rash.  Neurological: Negative for dizziness, seizures, weakness and headaches.  Psychiatric/Behavioral: Negative for depression, substance abuse and suicidal ideas. The patient is nervous/anxious.      Current Outpatient Medications:    amLODipine (NORVASC) 10 MG tablet, Take 1 tablet (10 mg total) by mouth daily., Disp: 90 tablet, Rfl: 1   Blood Glucose Monitoring Suppl (ACCU-CHEK AVIVA PLUS) w/Device KIT, USE AS DIRECTED., Disp: , Rfl:    escitalopram (LEXAPRO) 10 MG tablet, Take 1 tablet (10 mg total) by mouth at bedtime., Disp: 30 tablet, Rfl: 0   atomoxetine (STRATTERA) 40 MG capsule, Take 1 capsule (40 mg  total) by mouth daily., Disp: 30 capsule, Rfl: 0  No Known Allergies  Past Medical History:  Diagnosis Date   ADD (attention deficit disorder) 02/22/2015   Anxiety, generalized 02/15/2016   Diabetes mellitus without complication ([place])    Dyslipidemia 02/22/2015   Fatty liver 03/23/2015   Hypertension     History reviewed. No pertinent surgical history.  Family History  Problem Relation Age of Onset   Arthritis Mother    Diabetes Father    Hypertension Father    Stroke Father    Diabetes Sister    Hypertension Brother    Diabetes Maternal Grandmother    Brain cancer Maternal Grandfather    Heart attack Paternal Grandfather     Social History   Socioeconomic History   Marital status: Divorced    Spouse name: Not on file   Number of children: 2   Years of education: Not on file   Highest education level: Not on file  Occupational History   Not on file  Social Needs   Financial resource strain: Not on file   Food insecurity    Worry: Not on file    Inability: Not on file   Transportation needs  Medical: Not on file    Non-medical: Not on file  Tobacco Use   Smoking status: Never Smoker   Smokeless tobacco: Never Used  Substance and Sexual Activity   Alcohol use: Yes    Alcohol/week: 10.0 standard drinks    Types: 10 Cans of beer per week    Comment: daily   Drug use: Never   Sexual activity: Yes    Birth control/protection: Condom  Lifestyle   Physical activity    Days per week: Not on file    Minutes per session: Not on file   Stress: Not on file  Relationships   Social connections    Talks on phone: Not on file    Gets together: Not on file    Attends religious service: Not on file    Active member of club or organization: Not on file    Attends meetings of clubs or organizations: Not on file    Relationship status: Not on file   Intimate partner violence    Fear of current or ex partner: Not on file    Emotionally  abused: Not on file    Physically abused: Not on file    Forced sexual activity: Not on file  Other Topics Concern   Not on file  Social History Narrative   Not on file    Objective:   Today's Vitals: BP 113/69    Pulse 83    Temp 98.9 F (37.2 C) (Temporal)    Ht '5\' 9"'  (1.753 m)    Wt 207 lb (93.9 kg)    SpO2 98%    BMI 30.57 kg/m   Physical Exam Vitals signs reviewed.  Constitutional:      General: He is not in acute distress.    Appearance: Normal appearance. He is obese. He is not ill-appearing, toxic-appearing or diaphoretic.  HENT:     Head: Normocephalic and atraumatic.  Eyes:     General: No scleral icterus.       Right eye: No discharge.        Left eye: No discharge.     Conjunctiva/sclera: Conjunctivae normal.  Neck:     Musculoskeletal: Normal range of motion.  Cardiovascular:     Rate and Rhythm: Normal rate and regular rhythm.     Heart sounds: Normal heart sounds. No murmur. No friction rub. No gallop.   Pulmonary:     Effort: Pulmonary effort is normal. No respiratory distress.     Breath sounds: Normal breath sounds. No stridor. No wheezing, rhonchi or rales.  Musculoskeletal: Normal range of motion.  Skin:    General: Skin is warm and dry.  Neurological:     Mental Status: He is alert and oriented to person, place, and time. Mental status is at baseline.  Psychiatric:        Mood and Affect: Mood normal.        Behavior: Behavior normal.        Thought Content: Thought content normal.        Judgment: Judgment normal.    Diabetic Foot Exam - Simple   Simple Foot Form Diabetic Foot exam was performed with the following findings: Yes 02/13/2019  9:32 AM  Visual Inspection No deformities, no ulcerations, no other skin breakdown bilaterally: Yes Sensation Testing Intact to touch and monofilament testing bilaterally: Yes Pulse Check Posterior Tibialis and Dorsalis pulse intact bilaterally: Yes Comments

## 2019-02-13 NOTE — Patient Instructions (Signed)

## 2019-02-14 ENCOUNTER — Encounter: Payer: Self-pay | Admitting: Family Medicine

## 2019-02-14 DIAGNOSIS — R748 Abnormal levels of other serum enzymes: Secondary | ICD-10-CM | POA: Insufficient documentation

## 2019-02-14 LAB — CMP14+EGFR
ALT: 99 IU/L — ABNORMAL HIGH (ref 0–44)
AST: 136 IU/L — ABNORMAL HIGH (ref 0–40)
Albumin/Globulin Ratio: 1.7 (ref 1.2–2.2)
Albumin: 4.7 g/dL (ref 4.0–5.0)
Alkaline Phosphatase: 69 IU/L (ref 39–117)
BUN/Creatinine Ratio: 9 (ref 9–20)
BUN: 9 mg/dL (ref 6–20)
Bilirubin Total: 0.6 mg/dL (ref 0.0–1.2)
CO2: 23 mmol/L (ref 20–29)
Calcium: 9.8 mg/dL (ref 8.7–10.2)
Chloride: 100 mmol/L (ref 96–106)
Creatinine, Ser: 1.01 mg/dL (ref 0.76–1.27)
GFR calc Af Amer: 108 mL/min/{1.73_m2} (ref >59)
GFR calc non Af Amer: 93 mL/min/{1.73_m2} (ref >59)
Globulin, Total: 2.7 g/dL (ref 1.5–4.5)
Glucose: 167 mg/dL — ABNORMAL HIGH (ref 65–99)
Potassium: 4.3 mmol/L (ref 3.5–5.2)
Sodium: 137 mmol/L (ref 134–144)
Total Protein: 7.4 g/dL (ref 6.0–8.5)

## 2019-02-14 LAB — CBC WITH DIFFERENTIAL/PLATELET
Basophils Absolute: 0 10*3/uL (ref 0.0–0.2)
Basos: 1 %
EOS (ABSOLUTE): 0.2 10*3/uL (ref 0.0–0.4)
Eos: 3 %
Hematocrit: 43.4 % (ref 37.5–51.0)
Hemoglobin: 14.9 g/dL (ref 13.0–17.7)
Immature Grans (Abs): 0 10*3/uL (ref 0.0–0.1)
Immature Granulocytes: 0 %
Lymphocytes Absolute: 1.6 10*3/uL (ref 0.7–3.1)
Lymphs: 30 %
MCH: 29.7 pg (ref 26.6–33.0)
MCHC: 34.3 g/dL (ref 31.5–35.7)
MCV: 87 fL (ref 79–97)
Monocytes Absolute: 0.4 10*3/uL (ref 0.1–0.9)
Monocytes: 8 %
Neutrophils Absolute: 2.9 10*3/uL (ref 1.4–7.0)
Neutrophils: 58 %
Platelets: 223 10*3/uL (ref 150–450)
RBC: 5.01 x10E6/uL (ref 4.14–5.80)
RDW: 12.4 % (ref 11.6–15.4)
WBC: 5.1 10*3/uL (ref 3.4–10.8)

## 2019-02-14 LAB — LIPID PANEL
Chol/HDL Ratio: 5.2 ratio — ABNORMAL HIGH (ref 0.0–5.0)
Cholesterol, Total: 225 mg/dL — ABNORMAL HIGH (ref 100–199)
HDL: 43 mg/dL (ref >39)
LDL Chol Calc (NIH): 150 mg/dL — ABNORMAL HIGH (ref 0–99)
Triglycerides: 174 mg/dL — ABNORMAL HIGH (ref 0–149)
VLDL Cholesterol Cal: 32 mg/dL (ref 5–40)

## 2019-02-14 LAB — MICROALBUMIN / CREATININE URINE RATIO
Creatinine, Urine: 145.8 mg/dL
Microalb/Creat Ratio: 8 mg/g creat (ref 0–29)
Microalbumin, Urine: 11.5 ug/mL

## 2019-03-08 ENCOUNTER — Other Ambulatory Visit: Payer: Self-pay | Admitting: Family Medicine

## 2019-03-08 DIAGNOSIS — F411 Generalized anxiety disorder: Secondary | ICD-10-CM

## 2019-03-08 DIAGNOSIS — F900 Attention-deficit hyperactivity disorder, predominantly inattentive type: Secondary | ICD-10-CM

## 2019-03-10 NOTE — Telephone Encounter (Signed)
Pharmacy comment:  REQUEST FOR 90 DAYS PRESCRIPTION. DX Code Needed    

## 2019-04-02 ENCOUNTER — Encounter: Payer: Self-pay | Admitting: Family Medicine

## 2019-04-02 DIAGNOSIS — F900 Attention-deficit hyperactivity disorder, predominantly inattentive type: Secondary | ICD-10-CM

## 2019-04-02 MED ORDER — ATOMOXETINE HCL 80 MG PO CAPS: 80.0000 mg | ORAL_CAPSULE | Freq: Every day | ORAL | 2 refills | Status: DC

## 2019-05-04 ENCOUNTER — Other Ambulatory Visit: Payer: Self-pay | Admitting: Family Medicine

## 2019-05-04 DIAGNOSIS — F900 Attention-deficit hyperactivity disorder, predominantly inattentive type: Secondary | ICD-10-CM

## 2019-05-24 ENCOUNTER — Other Ambulatory Visit: Payer: Self-pay | Admitting: Family Medicine

## 2019-05-24 DIAGNOSIS — F411 Generalized anxiety disorder: Secondary | ICD-10-CM

## 2019-06-08 ENCOUNTER — Other Ambulatory Visit: Payer: Self-pay | Admitting: Family Medicine

## 2019-06-08 DIAGNOSIS — F411 Generalized anxiety disorder: Secondary | ICD-10-CM

## 2019-07-15 ENCOUNTER — Other Ambulatory Visit: Payer: Self-pay | Admitting: Family Medicine

## 2019-07-15 DIAGNOSIS — F900 Attention-deficit hyperactivity disorder, predominantly inattentive type: Secondary | ICD-10-CM

## 2019-08-14 ENCOUNTER — Other Ambulatory Visit: Payer: Self-pay

## 2019-08-14 ENCOUNTER — Ambulatory Visit (INDEPENDENT_AMBULATORY_CARE_PROVIDER_SITE_OTHER): Payer: BC Managed Care – PPO | Admitting: Family Medicine

## 2019-08-14 VITALS — BP 139/84 | HR 112 | Temp 97.8°F | Ht 69.0 in | Wt 207.0 lb

## 2019-08-14 DIAGNOSIS — F900 Attention-deficit hyperactivity disorder, predominantly inattentive type: Secondary | ICD-10-CM

## 2019-08-14 DIAGNOSIS — K649 Unspecified hemorrhoids: Secondary | ICD-10-CM | POA: Diagnosis not present

## 2019-08-14 DIAGNOSIS — F411 Generalized anxiety disorder: Secondary | ICD-10-CM

## 2019-08-14 DIAGNOSIS — E785 Hyperlipidemia, unspecified: Secondary | ICD-10-CM

## 2019-08-14 DIAGNOSIS — E1169 Type 2 diabetes mellitus with other specified complication: Secondary | ICD-10-CM

## 2019-08-14 LAB — BAYER DCA HB A1C WAIVED: HB A1C (BAYER DCA - WAIVED): 8.7 % — ABNORMAL HIGH (ref <7.0)

## 2019-08-14 MED ORDER — HYDROCORTISONE ACETATE 25 MG RE SUPP: 25.0000 mg | Freq: Two times a day (BID) | RECTAL | 0 refills | Status: DC

## 2019-08-14 MED ORDER — METFORMIN HCL ER 500 MG PO TB24: 500.0000 mg | ORAL_TABLET | Freq: Every day | ORAL | 2 refills | Status: DC

## 2019-08-14 MED ORDER — ESCITALOPRAM OXALATE 10 MG PO TABS: 10.0000 mg | ORAL_TABLET | Freq: Every day | ORAL | 1 refills | Status: DC

## 2019-08-14 MED ORDER — ATOMOXETINE HCL 100 MG PO CAPS: 100.0000 mg | ORAL_CAPSULE | Freq: Every day | ORAL | 2 refills | Status: DC

## 2019-08-14 NOTE — Patient Instructions (Signed)
Preparation H with Tucks pads.   Hemorrhoids Hemorrhoids are swollen veins that may develop:  In the butt (rectum). These are called internal hemorrhoids.  Around the opening of the butt (anus). These are called external hemorrhoids. Hemorrhoids can cause pain, itching, or bleeding. Most of the time, they do not cause serious problems. They usually get better with diet changes, lifestyle changes, and other home treatments. What are the causes? This condition may be caused by:  Having trouble pooping (constipation).  Pushing hard (straining) to poop.  Watery poop (diarrhea).  Pregnancy.  Being very overweight (obese).  Sitting for long periods of time.  Heavy lifting or other activity that causes you to strain.  Anal sex.  Riding a bike for a long period of time. What are the signs or symptoms? Symptoms of this condition include:  Pain.  Itching or soreness in the butt.  Bleeding from the butt.  Leaking poop.  Swelling in the area.  One or more lumps around the opening of your butt. How is this diagnosed? A doctor can often diagnose this condition by looking at the affected area. The doctor may also:  Do an exam that involves feeling the area with a gloved hand (digital rectal exam).  Examine the area inside your butt using a small tube (anoscope).  Order blood tests. This may be done if you have lost a lot of blood.  Have you get a test that involves looking inside the colon using a flexible tube with a camera on the end (sigmoidoscopy or colonoscopy). How is this treated? This condition can usually be treated at home. Your doctor may tell you to change what you eat, make lifestyle changes, or try home treatments. If these do not help, procedures can be done to remove the hemorrhoids or make them smaller. These may involve:  Placing rubber bands at the base of the hemorrhoids to cut off their blood supply.  Injecting medicine into the hemorrhoids to shrink  them.  Shining a type of light energy onto the hemorrhoids to cause them to fall off.  Doing surgery to remove the hemorrhoids or cut off their blood supply. Follow these instructions at home: Eating and drinking   Eat foods that have a lot of fiber in them. These include whole grains, beans, nuts, fruits, and vegetables.  Ask your doctor about taking products that have added fiber (fibersupplements).  Reduce the amount of fat in your diet. You can do this by: ? Eating low-fat dairy products. ? Eating less red meat. ? Avoiding processed foods.  Drink enough fluid to keep your pee (urine) pale yellow. Managing pain and swelling   Take a warm-water bath (sitz bath) for 20 minutes to ease pain. Do this 3-4 times a day. You may do this in a bathtub or using a portable sitz bath that fits over the toilet.  If told, put ice on the painful area. It may be helpful to use ice between your warm baths. ? Put ice in a plastic bag. ? Place a towel between your skin and the bag. ? Leave the ice on for 20 minutes, 2-3 times a day. General instructions  Take over-the-counter and prescription medicines only as told by your doctor. ? Medicated creams and medicines may be used as told.  Exercise often. Ask your doctor how much and what kind of exercise is best for you.  Go to the bathroom when you have the urge to poop. Do not wait.  Avoid pushing too  hard when you poop.  Keep your butt dry and clean. Use wet toilet paper or moist towelettes after pooping.  Do not sit on the toilet for a long time.  Keep all follow-up visits as told by your doctor. This is important. Contact a doctor if you:  Have pain and swelling that do not get better with treatment or medicine.  Have trouble pooping.  Cannot poop.  Have pain or swelling outside the area of the hemorrhoids. Get help right away if you have:  Bleeding that will not stop. Summary  Hemorrhoids are swollen veins in the butt or  around the opening of the butt.  They can cause pain, itching, or bleeding.  Eat foods that have a lot of fiber in them. These include whole grains, beans, nuts, fruits, and vegetables.  Take a warm-water bath (sitz bath) for 20 minutes to ease pain. Do this 3-4 times a day. This information is not intended to replace advice given to you by your health care provider. Make sure you discuss any questions you have with your health care provider. Document Revised: 05/23/2018 Document Reviewed: 10/04/2017 Elsevier Patient Education  2020 [employer].

## 2019-08-14 NOTE — Progress Notes (Signed)
Assessment & Plan:  1. Hemorrhoids, unspecified hemorrhoid type - Advised to use Anusol suppositories for internal hemorrhoids and Preparation H/Tucks pads for external hemorrhoids.  Discussed if his bleeding does not resolve in the next week he needs to be referred to a gastroenterologist.  Education provided on hemorrhoids. - hydrocortisone (ANUSOL-HC) 25 MG suppository; Place 1 suppository (25 mg total) rectally 2 (two) times daily.  Dispense: 12 suppository; Refill: 0  2. Anxiety, generalized - Well controlled on current regimen.  - escitalopram (LEXAPRO) 10 MG tablet; Take 1 tablet (10 mg total) by mouth at bedtime.  Dispense: 90 tablet; Refill: 1 - CMP14+EGFR  3. Attention deficit hyperactivity disorder (ADHD), predominantly inattentive type - Strattera dosage increased from 80 mg to 100 mg once daily. - atomoxetine (STRATTERA) 100 MG capsule; Take 1 capsule (100 mg total) by mouth daily.  Dispense: 30 capsule; Refill: 2 - CMP14+EGFR  4. DM type 2 with diabetic dyslipidemia ([place]) Lab Results  Component Value Date   HGBA1C 8.7 (H) 08/14/2019   HGBA1C 8.1 (H) 02/13/2019  - Diabetes is not at goal of A1c < 7. - Medications: started patient on metformin today - Home glucose monitoring: as needed - Patient is not currently taking a statin. Patient is not taking an ACE-inhibitor/ARB.  - Last foot exam: 02/13/2019 - Last diabetic eye exam: unknown - Urine Microalbumin/Creat Ratio: 02/13/2019 - Instruction/counseling given: reminded to get eye exam and discussed diet - Bayer DCA Hb A1c Waived - metFORMIN (GLUCOPHAGE-XR) 500 MG 24 hr tablet; Take 1 tablet (500 mg total) by mouth daily with breakfast.  Dispense: 30 tablet; Refill: 2 - CMP14+EGFR   Follow up plan: Return in about 3 months (around 11/14/2019) for DM.  Randall Limes, MSN, APRN, FNP-C [facility]  Subjective:   [name], male    DOB: [birth year], 39 y.o.   MRN: [id number]   HPI: Randall Murphy is a 39 y.o. male presenting on 08/14/2019 for Blood In Stools (x 4 weeks) and Anxiety (follow up)  Patient reports he has been having bright red blood in his stools for the past 4 weeks.  He reports it is present with every bowel movement.  He can tell a slight discoloration in the stools in the toilet but there is no blood in the toilet.  He is seeing the blood when he wipes.  He reports he is having soft stools and is not straining but does state he has a history of hemorrhoids and knows that he has been now.  Patient is also following up on his anxiety.  His Lexapro was increased from 5 mg to 10 mg on 02/13/2019.  He reports he is doing well with this new dosage.  Depression screen [facility] 2/9 08/17/2019 02/13/2019  Decreased Interest 0 0  Down, Depressed, Hopeless 0 0  PHQ - 2 Score 0 0  Altered sleeping 1 0  Tired, decreased energy 1 0  Change in appetite 0 0  Feeling bad or failure about yourself  0 0  Trouble concentrating 1 0  Moving slowly or fidgety/restless 0 0  Suicidal thoughts 0 0  PHQ-9 Score 3 0  Difficult doing work/chores Not difficult at all -   GAD 7 : Generalized Anxiety Score 08/17/2019 02/13/2019  Nervous, Anxious, on Edge 1 0  Control/stop worrying 1 0  Worry too much - different things 1 0  Trouble relaxing 0 0  Restless 0 3  Easily annoyed or irritable 1 2  Afraid - awful might happen 0 0  Total GAD 7 Score 4 5  Anxiety Difficulty Not difficult at all Very difficult    Patient also feels he needs an increase in his dosage of Strattera.  He states it works but not as well as when he was taking Vyvanse.  Patient and I also discussed his diabetes the last time he was here on 02/13/2019.  He did not want to go on medication at that time despite having an A1c of 8.1.  He was determined to get his A1c down with diet and exercise.   ROS: Negative unless specifically indicated above in HPI.   Relevant past medical history reviewed and updated as  indicated.   Allergies and medications reviewed and updated.   Current Outpatient Medications:  .  amLODipine (NORVASC) 10 MG tablet, Take 1 tablet (10 mg total) by mouth daily., Disp: 90 tablet, Rfl: 1 .  Blood Glucose Monitoring Suppl (ACCU-CHEK AVIVA PLUS) w/Device KIT, USE AS DIRECTED., Disp: , Rfl:  .  atomoxetine (STRATTERA) 100 MG capsule, Take 1 capsule (100 mg total) by mouth daily., Disp: 30 capsule, Rfl: 2 .  escitalopram (LEXAPRO) 10 MG tablet, Take 1 tablet (10 mg total) by mouth at bedtime., Disp: 90 tablet, Rfl: 1 .  hydrocortisone (ANUSOL-HC) 25 MG suppository, Place 1 suppository (25 mg total) rectally 2 (two) times daily., Disp: 12 suppository, Rfl: 0 .  metFORMIN (GLUCOPHAGE-XR) 500 MG 24 hr tablet, Take 1 tablet (500 mg total) by mouth daily with breakfast., Disp: 30 tablet, Rfl: 2  No Known Allergies  Objective:   BP 139/84   Pulse (!) 112   Temp 97.8 F (36.6 C) (Temporal)   Ht '5\' 9"'  (1.753 m)   Wt 207 lb (93.9 kg)   SpO2 99%   BMI 30.57 kg/m    Physical Exam Vitals reviewed.  Constitutional:      General: He is not in acute distress.    Appearance: Normal appearance. He is obese. He is not ill-appearing, toxic-appearing or diaphoretic.  HENT:     Head: Normocephalic and atraumatic.  Eyes:     General: No scleral icterus.       Right eye: No discharge.        Left eye: No discharge.     Conjunctiva/sclera: Conjunctivae normal.  Cardiovascular:     Rate and Rhythm: Normal rate and regular rhythm.     Heart sounds: Normal heart sounds. No murmur. No friction rub. No gallop.   Pulmonary:     Effort: Pulmonary effort is normal. No respiratory distress.     Breath sounds: Normal breath sounds. No stridor. No wheezing, rhonchi or rales.  Genitourinary:    Comments: Patient will not let me assess for hemorrhoids.  Musculoskeletal:        General: Normal range of motion.     Cervical back: Normal range of motion.  Skin:    General: Skin is warm and  dry.  Neurological:     Mental Status: He is alert and oriented to person, place, and time. Mental status is at baseline.  Psychiatric:        Mood and Affect: Mood normal.        Behavior: Behavior normal.        Thought Content: Thought content normal.        Judgment: Judgment normal.

## 2019-08-15 LAB — CMP14+EGFR
ALT: 95 IU/L — ABNORMAL HIGH (ref 0–44)
AST: 94 IU/L — ABNORMAL HIGH (ref 0–40)
Albumin/Globulin Ratio: 1.7 (ref 1.2–2.2)
Albumin: 4.7 g/dL (ref 4.0–5.0)
Alkaline Phosphatase: 77 IU/L (ref 39–117)
BUN/Creatinine Ratio: 11 (ref 9–20)
BUN: 10 mg/dL (ref 6–24)
Bilirubin Total: 0.5 mg/dL (ref 0.0–1.2)
CO2: 19 mmol/L — ABNORMAL LOW (ref 20–29)
Calcium: 10 mg/dL (ref 8.7–10.2)
Chloride: 100 mmol/L (ref 96–106)
Creatinine, Ser: 0.91 mg/dL (ref 0.76–1.27)
GFR calc Af Amer: 121 mL/min/{1.73_m2} (ref >59)
GFR calc non Af Amer: 105 mL/min/{1.73_m2} (ref >59)
Globulin, Total: 2.7 g/dL (ref 1.5–4.5)
Glucose: 262 mg/dL — ABNORMAL HIGH (ref 65–99)
Potassium: 3.9 mmol/L (ref 3.5–5.2)
Sodium: 137 mmol/L (ref 134–144)
Total Protein: 7.4 g/dL (ref 6.0–8.5)

## 2019-08-17 ENCOUNTER — Encounter: Payer: Self-pay | Admitting: Family Medicine

## 2019-09-09 ENCOUNTER — Other Ambulatory Visit: Payer: Self-pay | Admitting: Family Medicine

## 2019-09-09 DIAGNOSIS — F900 Attention-deficit hyperactivity disorder, predominantly inattentive type: Secondary | ICD-10-CM

## 2019-12-04 ENCOUNTER — Other Ambulatory Visit: Payer: Self-pay | Admitting: Family Medicine

## 2019-12-04 DIAGNOSIS — I10 Essential (primary) hypertension: Secondary | ICD-10-CM

## 2019-12-04 NOTE — Telephone Encounter (Signed)
Last OV 08/24/19. Last RF 02/13/19. Next OV not scheduled  30 day supply given  ntbs for further refills

## 2019-12-05 ENCOUNTER — Other Ambulatory Visit: Payer: Self-pay | Admitting: Family Medicine

## 2019-12-05 DIAGNOSIS — F900 Attention-deficit hyperactivity disorder, predominantly inattentive type: Secondary | ICD-10-CM

## 2019-12-12 ENCOUNTER — Other Ambulatory Visit: Payer: Self-pay | Admitting: Family Medicine

## 2019-12-12 DIAGNOSIS — E785 Hyperlipidemia, unspecified: Secondary | ICD-10-CM

## 2020-01-03 ENCOUNTER — Other Ambulatory Visit: Payer: Self-pay | Admitting: Family Medicine

## 2020-01-03 DIAGNOSIS — I10 Essential (primary) hypertension: Secondary | ICD-10-CM

## 2020-01-05 MED ORDER — AMLODIPINE BESYLATE 10 MG PO TABS: 10.0000 mg | ORAL_TABLET | Freq: Every day | ORAL | 0 refills | Status: DC

## 2020-01-05 NOTE — Addendum Note (Signed)
Addended by: Hessie Diener on: 01/05/2020 10:40 AM   Modules accepted: Orders

## 2020-01-05 NOTE — Telephone Encounter (Signed)
Britney NTBS 30 days given 12/04/19

## 2020-01-05 NOTE — Telephone Encounter (Signed)
Pt scheduled with B Alona Bene 01/22/20 at 8:50 and a 30 day supply sent in.

## 2020-01-16 ENCOUNTER — Other Ambulatory Visit: Payer: Self-pay | Admitting: Family Medicine

## 2020-01-16 DIAGNOSIS — E1169 Type 2 diabetes mellitus with other specified complication: Secondary | ICD-10-CM

## 2020-01-16 DIAGNOSIS — E785 Hyperlipidemia, unspecified: Secondary | ICD-10-CM

## 2020-01-22 ENCOUNTER — Other Ambulatory Visit: Payer: Self-pay

## 2020-01-22 ENCOUNTER — Ambulatory Visit: Payer: BC Managed Care – PPO | Admitting: Family Medicine

## 2020-01-22 ENCOUNTER — Encounter: Payer: Self-pay | Admitting: Family Medicine

## 2020-01-22 VITALS — BP 121/83 | HR 92 | Temp 97.8°F | Ht 69.0 in | Wt 206.0 lb

## 2020-01-22 DIAGNOSIS — I10 Essential (primary) hypertension: Secondary | ICD-10-CM

## 2020-01-22 DIAGNOSIS — E1169 Type 2 diabetes mellitus with other specified complication: Secondary | ICD-10-CM | POA: Diagnosis not present

## 2020-01-22 DIAGNOSIS — F900 Attention-deficit hyperactivity disorder, predominantly inattentive type: Secondary | ICD-10-CM | POA: Diagnosis not present

## 2020-01-22 DIAGNOSIS — F411 Generalized anxiety disorder: Secondary | ICD-10-CM

## 2020-01-22 DIAGNOSIS — E785 Hyperlipidemia, unspecified: Secondary | ICD-10-CM

## 2020-01-22 LAB — BAYER DCA HB A1C WAIVED: HB A1C (BAYER DCA - WAIVED): 8.4 % — ABNORMAL HIGH (ref <7.0)

## 2020-01-22 MED ORDER — ATOMOXETINE HCL 100 MG PO CAPS: 100.0000 mg | ORAL_CAPSULE | Freq: Every day | ORAL | 2 refills | Status: DC

## 2020-01-22 MED ORDER — ESCITALOPRAM OXALATE 10 MG PO TABS: 10.0000 mg | ORAL_TABLET | Freq: Every day | ORAL | 1 refills | Status: DC

## 2020-01-22 MED ORDER — METFORMIN HCL ER 500 MG PO TB24: ORAL_TABLET | ORAL | 2 refills | Status: DC

## 2020-01-22 MED ORDER — AMLODIPINE BESYLATE 10 MG PO TABS: 10.0000 mg | ORAL_TABLET | Freq: Every day | ORAL | 1 refills | Status: DC

## 2020-01-22 NOTE — Progress Notes (Signed)
Assessment & Plan:  1. DM type 2 with diabetic dyslipidemia ([facility]) Lab Results  Component Value Date   HGBA1C 8.4 (H) 01/22/2020   HGBA1C 8.7 (H) 08/14/2019   HGBA1C 8.1 (H) 02/13/2019  - Diabetes is not at goal of A1c < 7. - Medications: increase metformin from 500 mg QD to 1,000 mg in the AM and 500 mg in the PM - Home glucose monitoring: patient is not monitoring - Patient is not currently taking a statin. Patient is not taking an ACE-inhibitor/ARB. Neither started as patient is very reluctant to even increase metformin.  - Last foot exam: 02/13/2019 - Last diabetic eye exam: never - Urine Microalbumin/Creat Ratio: 02/13/2019 - Instruction/counseling given: reminded to get eye exam, discussed the need for weight loss, discussed diet and provided printed educational material - Bayer DCA Hb A1c Waived - metFORMIN (GLUCOPHAGE-XR) 500 MG 24 hr tablet; Take 2 tablets (1,000 mg) by mouth daily with breakfast, and 1 tablet (500 mg) with dinner.  Dispense: 90 tablet; Refill: 2 - CMP14+EGFR  2. Essential hypertension - Well controlled on current regimen.  - amLODipine (NORVASC) 10 MG tablet; Take 1 tablet (10 mg total) by mouth daily.  Dispense: 90 tablet; Refill: 1 - CMP14+EGFR  3. Anxiety, generalized - Well controlled on current regimen.  - escitalopram (LEXAPRO) 10 MG tablet; Take 1 tablet (10 mg total) by mouth at bedtime.  Dispense: 90 tablet; Refill: 1  4. Attention deficit hyperactivity disorder (ADHD), predominantly inattentive type - Patient does not feel the Strattera does as well for him as the Vyvanse but does not wish to see a psychiatrist to switch medications.  - atomoxetine (STRATTERA) 100 MG capsule; Take 1 capsule (100 mg total) by mouth daily.  Dispense: 30 capsule; Refill: 2   Return in about 3 months (around 04/23/2020) for DM.  Hendricks Limes, MSN, APRN, FNP-C [facility]  Subjective:    [name], male    DOB:  [birth year], 39 y.o.   MRN: [id number]  Patient Care Team: Loman Brooklyn, FNP as PCP - General (Family Medicine) Gala Romney Cristopher Estimable, MD as Consulting Physician (Gastroenterology)   Chief Complaint:  Chief Complaint  Patient presents with  . Diabetes    check up of chronic medical conditions  . Anxiety  . ADHD    HPI: Randall Murphy is a 39 y.o. male presenting on 01/22/2020 for Diabetes (check up of chronic medical conditions), Anxiety, and ADHD  Diabetes: Patient presents for follow up of diabetes. Current symptoms include: none. Known diabetic complications: none. Medication compliance: yes. Current diet: in general, an "unhealthy" diet. Current exercise: walking. Home blood sugar records: patient does not check sugars. Is he  on ACE inhibitor or angiotensin II receptor blocker? No. Is he on a statin? No.   Lab Results  Component Value Date   HGBA1C 8.4 (H) 01/22/2020   HGBA1C 8.7 (H) 08/14/2019   HGBA1C 8.1 (H) 02/13/2019   Lab Results  Component Value Date   LDLCALC 150 (H) 02/13/2019   CREATININE 0.91 08/14/2019     New complaints: None  Social history:  Relevant past medical, surgical, family and social history reviewed and updated as indicated. Interim medical history since our last visit reviewed.  Allergies and medications reviewed and updated.  DATA REVIEWED: CHART IN EPIC  ROS: Negative unless specifically indicated above in HPI.    Current Outpatient Medications:  .  amLODipine (NORVASC) 10 MG tablet, Take 1 tablet (10 mg total)  by mouth daily. Needs appointment for further refills, Disp: 30 tablet, Rfl: 0 .  atomoxetine (STRATTERA) 100 MG capsule, Take 1 capsule (100 mg total) by mouth daily., Disp: 30 capsule, Rfl: 2 .  Blood Glucose Monitoring Suppl (ACCU-CHEK AVIVA PLUS) w/Device KIT, USE AS DIRECTED., Disp: , Rfl:  .  escitalopram (LEXAPRO) 10 MG tablet, Take 1 tablet (10 mg total) by mouth at bedtime., Disp: 90 tablet, Rfl: 1 .  metFORMIN  (GLUCOPHAGE-XR) 500 MG 24 hr tablet, TAKE 1 TABLET BY MOUTH EVERY DAY WITH BREAKFAST, Disp: 30 tablet, Rfl: 0   No Known Allergies Past Medical History:  Diagnosis Date  . ADD (attention deficit disorder) 02/22/2015  . Anxiety, generalized 02/15/2016  . Diabetes mellitus without complication ([place])   . Dyslipidemia 02/22/2015  . Elevated liver enzymes   . Fatty liver 03/23/2015  . Hypertension     History reviewed. No pertinent surgical history.  Social History   Socioeconomic History  . Marital status: Divorced    Spouse name: Not on file  . Number of children: 2  . Years of education: Not on file  . Highest education level: Not on file  Occupational History  . Not on file  Tobacco Use  . Smoking status: Never Smoker  . Smokeless tobacco: Never Used  Vaping Use  . Vaping Use: Never used  Substance and Sexual Activity  . Alcohol use: Yes    Alcohol/week: 10.0 standard drinks    Types: 10 Cans of beer per week    Comment: daily  . Drug use: Never  . Sexual activity: Yes    Birth control/protection: Condom  Other Topics Concern  . Not on file  Social History Narrative  . Not on file   Social Determinants of Health   Financial Resource Strain:   . Difficulty of Paying Living Expenses: Not on file  Food Insecurity:   . Worried About Charity fundraiser in the Last Year: Not on file  . Ran Out of Food in the Last Year: Not on file  Transportation Needs:   . Lack of Transportation (Medical): Not on file  . Lack of Transportation (Non-Medical): Not on file  Physical Activity:   . Days of Exercise per Week: Not on file  . Minutes of Exercise per Session: Not on file  Stress:   . Feeling of Stress : Not on file  Social Connections:   . Frequency of Communication with Friends and Family: Not on file  . Frequency of Social Gatherings with Friends and Family: Not on file  . Attends Religious Services: Not on file  . Active Member of Clubs or Organizations: Not on file  .  Attends Archivist Meetings: Not on file  . Marital Status: Not on file  Intimate Partner Violence:   . Fear of Current or Ex-Partner: Not on file  . Emotionally Abused: Not on file  . Physically Abused: Not on file  . Sexually Abused: Not on file        Objective:    BP 121/83   Pulse 92   Temp 97.8 F (36.6 C) (Temporal)   Ht '5\' 9"'  (1.753 m)   Wt 206 lb (93.4 kg)   SpO2 99%   BMI 30.42 kg/m   Wt Readings from Last 3 Encounters:  08/14/19 207 lb (93.9 kg)  02/13/19 207 lb (93.9 kg)  02/07/18 201 lb 8 oz (91.4 kg)    Physical Exam Vitals reviewed.  Constitutional:  General: He is not in acute distress.    Appearance: Normal appearance. He is obese. He is not ill-appearing, toxic-appearing or diaphoretic.  HENT:     Head: Normocephalic and atraumatic.  Eyes:     General: No scleral icterus.       Right eye: No discharge.        Left eye: No discharge.     Conjunctiva/sclera: Conjunctivae normal.  Cardiovascular:     Rate and Rhythm: Normal rate and regular rhythm.     Heart sounds: Normal heart sounds. No murmur heard.  No friction rub. No gallop.   Pulmonary:     Effort: Pulmonary effort is normal. No respiratory distress.     Breath sounds: Normal breath sounds. No stridor. No wheezing, rhonchi or rales.  Musculoskeletal:        General: Normal range of motion.     Cervical back: Normal range of motion.  Skin:    General: Skin is warm and dry.  Neurological:     Mental Status: He is alert and oriented to person, place, and time. Mental status is at baseline.  Psychiatric:        Mood and Affect: Mood normal.        Behavior: Behavior normal.        Thought Content: Thought content normal.        Judgment: Judgment normal.     No results found for: TSH Lab Results  Component Value Date   WBC 5.1 02/13/2019   HGB 14.9 02/13/2019   HCT 43.4 02/13/2019   MCV 87 02/13/2019   PLT 223 02/13/2019   Lab Results  Component Value Date   NA  137 08/14/2019   K 3.9 08/14/2019   CO2 19 (L) 08/14/2019   GLUCOSE 262 (H) 08/14/2019   BUN 10 08/14/2019   CREATININE 0.91 08/14/2019   BILITOT 0.5 08/14/2019   ALKPHOS 77 08/14/2019   AST 94 (H) 08/14/2019   ALT 95 (H) 08/14/2019   PROT 7.4 08/14/2019   ALBUMIN 4.7 08/14/2019   CALCIUM 10.0 08/14/2019   ANIONGAP 9 02/06/2018   Lab Results  Component Value Date   CHOL 225 (H) 02/13/2019   Lab Results  Component Value Date   HDL 43 02/13/2019   Lab Results  Component Value Date   LDLCALC 150 (H) 02/13/2019   Lab Results  Component Value Date   TRIG 174 (H) 02/13/2019   Lab Results  Component Value Date   CHOLHDL 5.2 (H) 02/13/2019   Lab Results  Component Value Date   HGBA1C 8.7 (H) 08/14/2019

## 2020-01-22 NOTE — Patient Instructions (Signed)

## 2020-01-23 LAB — CMP14+EGFR
ALT: 102 IU/L — ABNORMAL HIGH (ref 0–44)
AST: 156 IU/L — ABNORMAL HIGH (ref 0–40)
Albumin/Globulin Ratio: 1.6 (ref 1.2–2.2)
Albumin: 4.6 g/dL (ref 4.0–5.0)
Alkaline Phosphatase: 74 IU/L (ref 48–121)
BUN/Creatinine Ratio: 6 — ABNORMAL LOW (ref 9–20)
BUN: 6 mg/dL (ref 6–24)
Bilirubin Total: 0.7 mg/dL (ref 0.0–1.2)
CO2: 26 mmol/L (ref 20–29)
Calcium: 9.7 mg/dL (ref 8.7–10.2)
Chloride: 98 mmol/L (ref 96–106)
Creatinine, Ser: 1.05 mg/dL (ref 0.76–1.27)
GFR calc Af Amer: 102 mL/min/{1.73_m2} (ref >59)
GFR calc non Af Amer: 88 mL/min/{1.73_m2} (ref >59)
Globulin, Total: 2.8 g/dL (ref 1.5–4.5)
Glucose: 282 mg/dL — ABNORMAL HIGH (ref 65–99)
Potassium: 4.8 mmol/L (ref 3.5–5.2)
Sodium: 136 mmol/L (ref 134–144)
Total Protein: 7.4 g/dL (ref 6.0–8.5)

## 2020-02-18 ENCOUNTER — Other Ambulatory Visit: Payer: Self-pay | Admitting: Family Medicine

## 2020-02-18 DIAGNOSIS — E1169 Type 2 diabetes mellitus with other specified complication: Secondary | ICD-10-CM

## 2020-02-18 DIAGNOSIS — E785 Hyperlipidemia, unspecified: Secondary | ICD-10-CM

## 2020-04-27 ENCOUNTER — Other Ambulatory Visit: Payer: Self-pay | Admitting: Family Medicine

## 2020-04-27 DIAGNOSIS — E1169 Type 2 diabetes mellitus with other specified complication: Secondary | ICD-10-CM

## 2020-04-28 ENCOUNTER — Other Ambulatory Visit: Payer: Self-pay | Admitting: Family Medicine

## 2020-04-28 DIAGNOSIS — F900 Attention-deficit hyperactivity disorder, predominantly inattentive type: Secondary | ICD-10-CM

## 2020-06-17 ENCOUNTER — Other Ambulatory Visit: Payer: Self-pay | Admitting: Family Medicine

## 2020-06-17 DIAGNOSIS — E785 Hyperlipidemia, unspecified: Secondary | ICD-10-CM

## 2020-06-17 DIAGNOSIS — E1169 Type 2 diabetes mellitus with other specified complication: Secondary | ICD-10-CM

## 2020-06-18 NOTE — Telephone Encounter (Signed)
Britney NTBS 30 days given 05/19/20

## 2020-07-12 ENCOUNTER — Other Ambulatory Visit: Payer: Self-pay | Admitting: Family Medicine

## 2020-07-12 DIAGNOSIS — F900 Attention-deficit hyperactivity disorder, predominantly inattentive type: Secondary | ICD-10-CM

## 2020-07-12 DIAGNOSIS — E1169 Type 2 diabetes mellitus with other specified complication: Secondary | ICD-10-CM

## 2020-07-30 ENCOUNTER — Other Ambulatory Visit: Payer: Self-pay | Admitting: Family Medicine

## 2020-07-30 DIAGNOSIS — I10 Essential (primary) hypertension: Secondary | ICD-10-CM

## 2020-07-30 DIAGNOSIS — F411 Generalized anxiety disorder: Secondary | ICD-10-CM

## 2020-08-03 ENCOUNTER — Other Ambulatory Visit: Payer: Self-pay

## 2020-08-03 ENCOUNTER — Ambulatory Visit: Payer: BC Managed Care – PPO | Admitting: Family Medicine

## 2020-08-03 ENCOUNTER — Encounter: Payer: Self-pay | Admitting: Family Medicine

## 2020-08-03 VITALS — BP 144/97 | HR 86 | Temp 98.0°F | Ht 69.0 in | Wt 205.0 lb

## 2020-08-03 DIAGNOSIS — F411 Generalized anxiety disorder: Secondary | ICD-10-CM

## 2020-08-03 DIAGNOSIS — E1169 Type 2 diabetes mellitus with other specified complication: Secondary | ICD-10-CM

## 2020-08-03 DIAGNOSIS — E785 Hyperlipidemia, unspecified: Secondary | ICD-10-CM

## 2020-08-03 DIAGNOSIS — F900 Attention-deficit hyperactivity disorder, predominantly inattentive type: Secondary | ICD-10-CM

## 2020-08-03 DIAGNOSIS — I10 Essential (primary) hypertension: Secondary | ICD-10-CM | POA: Diagnosis not present

## 2020-08-03 LAB — BAYER DCA HB A1C WAIVED: HB A1C (BAYER DCA - WAIVED): 8.5 % — ABNORMAL HIGH (ref <7.0)

## 2020-08-03 MED ORDER — OZEMPIC (0.25 OR 0.5 MG/DOSE) 2 MG/1.5ML ~~LOC~~ SOPN: 0.2500 mg | PEN_INJECTOR | SUBCUTANEOUS | 0 refills | Status: DC

## 2020-08-03 NOTE — Patient Instructions (Addendum)
Finish your metformin.  Schedule an eye exam.   Diabetes Mellitus and Nutrition, Adult When you have diabetes, or diabetes mellitus, it is very important to have healthy eating habits because your blood sugar (glucose) levels are greatly affected by what you eat and drink. Eating healthy foods in the right amounts, at about the same times every day, can help you:  Control your blood glucose.  Lower your risk of heart disease.  Improve your blood pressure.  Reach or maintain a healthy weight. What can affect my meal plan? Every person with diabetes is different, and each person has different needs for a meal plan. Your health care provider may recommend that you work with a dietitian to make a meal plan that is best for you. Your meal plan may vary depending on factors such as:  The calories you need.  The medicines you take.  Your weight.  Your blood glucose, blood pressure, and cholesterol levels.  Your activity level.  Other health conditions you have, such as heart or kidney disease. How do carbohydrates affect me? Carbohydrates, also called carbs, affect your blood glucose level more than any other type of food. Eating carbs naturally raises the amount of glucose in your blood. Carb counting is a method for keeping track of how many carbs you eat. Counting carbs is important to keep your blood glucose at a healthy level, especially if you use insulin or take certain oral diabetes medicines. It is important to know how many carbs you can safely have in each meal. This is different for every person. Your dietitian can help you calculate how many carbs you should have at each meal and for each snack. How does alcohol affect me? Alcohol can cause a sudden decrease in blood glucose (hypoglycemia), especially if you use insulin or take certain oral diabetes medicines. Hypoglycemia can be a life-threatening condition. Symptoms of hypoglycemia, such as sleepiness, dizziness, and  confusion, are similar to symptoms of having too much alcohol.  Do not drink alcohol if: ? Your health care provider tells you not to drink. ? You are pregnant, may be pregnant, or are planning to become pregnant.  If you drink alcohol: ? Do not drink on an empty stomach. ? Limit how much you use to:  0-1 drink a day for women.  0-2 drinks a day for men. ? Be aware of how much alcohol is in your drink. In the [place] one drink equals one 12 oz bottle of beer (355 mL), one 5 oz glass of wine (148 mL), or one 1 oz glass of hard liquor (44 mL). ? Keep yourself hydrated with water, diet soda, or unsweetened iced tea.  Keep in mind that regular soda, juice, and other mixers may contain a lot of sugar and must be counted as carbs. What are tips for following this plan? Reading food labels  Start by checking the serving size on the "Nutrition Facts" label of packaged foods and drinks. The amount of calories, carbs, fats, and other nutrients listed on the label is based on one serving of the item. Many items contain more than one serving per package.  Check the total grams (g) of carbs in one serving. You can calculate the number of servings of carbs in one serving by dividing the total carbs by 15. For example, if a food has 30 g of total carbs per serving, it would be equal to 2 servings of carbs.  Check the number of grams (g) of saturated fats  and trans fats in one serving. Choose foods that have a low amount or none of these fats.  Check the number of milligrams (mg) of salt (sodium) in one serving. Most people should limit total sodium intake to less than 2,300 mg per day.  Always check the nutrition information of foods labeled as "low-fat" or "nonfat." These foods may be higher in added sugar or refined carbs and should be avoided.  Talk to your dietitian to identify your daily goals for nutrients listed on the label. Shopping  Avoid buying canned, pre-made, or processed foods. These  foods tend to be high in fat, sodium, and added sugar.  Shop around the outside edge of the grocery store. This is where you will most often find fresh fruits and vegetables, bulk grains, fresh meats, and fresh dairy. Cooking  Use low-heat cooking methods, such as baking, instead of high-heat cooking methods like deep frying.  Cook using healthy oils, such as olive, canola, or sunflower oil.  Avoid cooking with butter, cream, or high-fat meats. Meal planning  Eat meals and snacks regularly, preferably at the same times every day. Avoid going long periods of time without eating.  Eat foods that are high in fiber, such as fresh fruits, vegetables, beans, and whole grains. Talk with your dietitian about how many servings of carbs you can eat at each meal.  Eat 4-6 oz (112-168 g) of lean protein each day, such as lean meat, chicken, fish, eggs, or tofu. One ounce (oz) of lean protein is equal to: ? 1 oz (28 g) of meat, chicken, or fish. ? 1 egg. ?  cup (62 g) of tofu.  Eat some foods each day that contain healthy fats, such as avocado, nuts, seeds, and fish.   What foods should I eat? Fruits Berries. Apples. Oranges. Peaches. Apricots. Plums. Grapes. Mango. Papaya. Pomegranate. Kiwi. Cherries. Vegetables Lettuce. Spinach. Leafy greens, including kale, chard, collard greens, and mustard greens. Beets. Cauliflower. Cabbage. Broccoli. Carrots. Green beans. Tomatoes. Peppers. Onions. Cucumbers. Brussels sprouts. Grains Whole grains, such as whole-wheat or whole-grain bread, crackers, tortillas, cereal, and pasta. Unsweetened oatmeal. Quinoa. Brown or wild rice. Meats and other proteins Seafood. Poultry without skin. Lean cuts of poultry and beef. Tofu. Nuts. Seeds. Dairy Low-fat or fat-free dairy products such as milk, yogurt, and cheese. The items listed above may not be a complete list of foods and beverages you can eat. Contact a dietitian for more information. What foods should I  avoid? Fruits Fruits canned with syrup. Vegetables Canned vegetables. Frozen vegetables with butter or cream sauce. Grains Refined white flour and flour products such as bread, pasta, snack foods, and cereals. Avoid all processed foods. Meats and other proteins Fatty cuts of meat. Poultry with skin. Breaded or fried meats. Processed meat. Avoid saturated fats. Dairy Full-fat yogurt, cheese, or milk. Beverages Sweetened drinks, such as soda or iced tea. The items listed above may not be a complete list of foods and beverages you should avoid. Contact a dietitian for more information. Questions to ask a health care provider  Do I need to meet with a diabetes educator?  Do I need to meet with a dietitian?  What number can I call if I have questions?  When are the best times to check my blood glucose? Where to find more information:  American Diabetes Association: diabetes.org  Academy of Nutrition and Dietetics: www.eatright.[employer] of Diabetes and Digestive and Kidney Diseases: CarFlippers.tn  Association of Diabetes Care and Education Specialists:  www.diabeteseducator.org Summary  It is important to have healthy eating habits because your blood sugar (glucose) levels are greatly affected by what you eat and drink.  A healthy meal plan will help you control your blood glucose and maintain a healthy lifestyle.  Your health care provider may recommend that you work with a dietitian to make a meal plan that is best for you.  Keep in mind that carbohydrates (carbs) and alcohol have immediate effects on your blood glucose levels. It is important to count carbs and to use alcohol carefully. This information is not intended to replace advice given to you by your health care provider. Make sure you discuss any questions you have with your health care provider. Document Revised: 04/22/2019 Document Reviewed: 04/22/2019 Elsevier Patient Education  2021 [employer].

## 2020-08-03 NOTE — Progress Notes (Signed)
Assessment & Plan:  1. DM type 2 with diabetic dyslipidemia (HCC) Lab Results  Component Value Date   HGBA1C 8.5 (H) 08/03/2020   HGBA1C 8.4 (H) 01/22/2020   HGBA1C 8.7 (H) 08/14/2019    - Diabetes is not at goal of A1c < 7. - Medications: Metformin D/C'd since patient is not remembering to take. Rx'd Ozempic. Plan to increase from 0.25 mg to 0.5 mg at 6 week follow-up if he is tolerating the medication okay. He is going to take the rest of his Metformin while on the lowest dose of Ozempic to help his diabetes. - Patient is not currently taking a statin. Patient is not taking an ACE-inhibitor/ARB.  - Instruction/counseling given: reminded to get eye exam, discussed foot care, discussed diet and provided printed educational material  Diabetes Health Maintenance Due  Topic Date Due  . OPHTHALMOLOGY EXAM  Never done  . HEMOGLOBIN A1C  11/03/2020  . FOOT EXAM  08/03/2021  . URINE MICROALBUMIN  08/03/2021    Lab Results  Component Value Date   LABMICR 47.2 08/03/2020   LABMICR 11.5 02/13/2019   - Lipid panel - CBC with Differential/Platelet - CMP14+EGFR - Bayer DCA Hb A1c Waived - Microalbumin / creatinine urine ratio - Semaglutide,0.25 or 0.5MG/DOS, (OZEMPIC, 0.25 OR 0.5 MG/DOSE,) 2 MG/1.5ML SOPN; Inject 0.25 mg into the skin once a week.  Dispense: 1.5 mL; Refill: 0  2. Essential hypertension Uncontrolled. Just resumed Amlodipine. Diet and exercise encouraged. - Lipid panel - CBC with Differential/Platelet - CMP14+EGFR  3. Anxiety, generalized Uncontrolled. Just resumed Lexapro. - CMP14+EGFR   Return in about 6 weeks (around 09/14/2020) for DM, HTN.  Hendricks Limes, MSN, APRN, FNP-C [facility]  Subjective:    [name], male    DOB: 1980-10-24, 40 y.o.   MRN: [id number]  Patient Care Team: Loman Brooklyn, FNP as PCP - General (Family Medicine) Gala Romney Cristopher Estimable, MD as Consulting Physician (Gastroenterology)   Chief  Complaint:  Chief Complaint  Patient presents with  . Diabetes    Check up of chronic medical conditions   . ADHD    HPI: Randall Murphy is a 40 y.o. male presenting on 08/03/2020 for Diabetes (Check up of chronic medical conditions )  Diabetes: Patient presents for follow up of diabetes. Current symptoms include: hyperglycemia. Known diabetic complications: none. Medication compliance: patient has not been taking his metformin for at least the past month. He states he does good remembering the morning dose but not the evening dose. Current diet: in general, an "unhealthy" diet. Current exercise: none. Home blood sugar records: patient does not check sugars. Is he  on ACE inhibitor or angiotensin II receptor blocker? No. Is he on a statin? No. His father is currently in the hospital with complications from diabetes.   Hypertension: patient reports he just resumed amlodipine 2 days ago.   Anxiety: patient just resumed Lexapro two days ago as well.  GAD 7 : Generalized Anxiety Score 08/03/2020 01/22/2020 08/17/2019 02/13/2019  Nervous, Anxious, on Edge '1 1 1 ' 0  Control/stop worrying 0 0 1 0  Worry too much - different things '1 1 1 ' 0  Trouble relaxing 0 1 0 0  Restless 2 1 0 3  Easily annoyed or irritable 3 0 1 2  Afraid - awful might happen 0 0 0 0  Total GAD 7 Score '7 4 4 5  ' Anxiety Difficulty Somewhat difficult - Not difficult at all Very difficult  Depression screen [facility] 2/9 08/03/2020 01/22/2020 08/17/2019  Decreased Interest 0 0 0  Down, Depressed, Hopeless 0 0 0  PHQ - 2 Score 0 0 0  Altered sleeping 0 0 1  Tired, decreased energy 0 0 1  Change in appetite 0 0 0  Feeling bad or failure about yourself  0 0 0  Trouble concentrating '2 2 1  ' Moving slowly or fidgety/restless 0 0 0  Suicidal thoughts 0 0 0  PHQ-9 Score '2 2 3  ' Difficult doing work/chores Somewhat difficult - Not difficult at all   ADHD: he did not feel like Strattera helped him. He does not wish to see psychiatry.   New  complaints: None  Social history:  Relevant past medical, surgical, family and social history reviewed and updated as indicated. Interim medical history since our last visit reviewed.  Allergies and medications reviewed and updated.  DATA REVIEWED: CHART IN EPIC  ROS: Negative unless specifically indicated above in HPI.    Current Outpatient Medications:  .  amLODipine (NORVASC) 10 MG tablet, TAKE 1 TABLET BY MOUTH EVERY DAY, Disp: 90 tablet, Rfl: 1 .  atomoxetine (STRATTERA) 100 MG capsule, Take 1 capsule (100 mg total) by mouth daily., Disp: 30 capsule, Rfl: 2 .  escitalopram (LEXAPRO) 10 MG tablet, TAKE 1 TABLET BY MOUTH EVERYDAY AT BEDTIME, Disp: 90 tablet, Rfl: 1 .  metFORMIN (GLUCOPHAGE-XR) 500 MG 24 hr tablet, TAKE 1 TABLET BY MOUTH EVERY DAY WITH BREAKFAST (Needs to be seen before next refill), Disp: 30 tablet, Rfl: 0   No Known Allergies Past Medical History:  Diagnosis Date  . ADD (attention deficit disorder) 02/22/2015  . Anxiety, generalized 02/15/2016  . Diabetes mellitus without complication ([place])   . Dyslipidemia 02/22/2015  . Elevated liver enzymes   . Fatty liver 03/23/2015  . Hypertension     History reviewed. No pertinent surgical history.  Social History   Socioeconomic History  . Marital status: Divorced    Spouse name: Not on file  . Number of children: 2  . Years of education: Not on file  . Highest education level: Not on file  Occupational History  . Not on file  Tobacco Use  . Smoking status: Never Smoker  . Smokeless tobacco: Never Used  Vaping Use  . Vaping Use: Never used  Substance and Sexual Activity  . Alcohol use: Yes    Alcohol/week: 10.0 standard drinks    Types: 10 Cans of beer per week    Comment: daily  . Drug use: Never  . Sexual activity: Yes    Birth control/protection: Condom  Other Topics Concern  . Not on file  Social History Narrative  . Not on file   Social Determinants of Health   Financial Resource Strain: Not  on file  Food Insecurity: Not on file  Transportation Needs: Not on file  Physical Activity: Not on file  Stress: Not on file  Social Connections: Not on file  Intimate Partner Violence: Not on file        Objective:    BP (!) 144/97   Pulse 86   Temp 98 F (36.7 C) (Temporal)   Ht '5\' 9"'  (1.753 m)   Wt 205 lb (93 kg)   SpO2 98%   BMI 30.27 kg/m   Wt Readings from Last 3 Encounters:  08/03/20 205 lb (93 kg)  01/22/20 206 lb (93.4 kg)  08/14/19 207 lb (93.9 kg)    Physical Exam Vitals reviewed.  Constitutional:  General: He is not in acute distress.    Appearance: Normal appearance. He is obese. He is not ill-appearing, toxic-appearing or diaphoretic.  HENT:     Head: Normocephalic and atraumatic.  Eyes:     General: No scleral icterus.       Right eye: No discharge.        Left eye: No discharge.     Conjunctiva/sclera: Conjunctivae normal.  Cardiovascular:     Rate and Rhythm: Normal rate and regular rhythm.     Heart sounds: Normal heart sounds. No murmur heard. No friction rub. No gallop.   Pulmonary:     Effort: Pulmonary effort is normal. No respiratory distress.     Breath sounds: Normal breath sounds. No stridor. No wheezing, rhonchi or rales.  Musculoskeletal:        General: Normal range of motion.     Cervical back: Normal range of motion.  Skin:    General: Skin is warm and dry.  Neurological:     Mental Status: He is alert and oriented to person, place, and time. Mental status is at baseline.  Psychiatric:        Mood and Affect: Mood normal.        Behavior: Behavior normal.        Thought Content: Thought content normal.        Judgment: Judgment normal.    Diabetic Foot Exam - Simple   Simple Foot Form Diabetic Foot exam was performed with the following findings: Yes 08/03/2020 10:20 AM  Visual Inspection No deformities, no ulcerations, no other skin breakdown bilaterally: Yes Sensation Testing Intact to touch and monofilament testing  bilaterally: Yes Pulse Check Posterior Tibialis and Dorsalis pulse intact bilaterally: Yes Comments     No results found for: TSH Lab Results  Component Value Date   WBC 5.1 02/13/2019   HGB 14.9 02/13/2019   HCT 43.4 02/13/2019   MCV 87 02/13/2019   PLT 223 02/13/2019   Lab Results  Component Value Date   NA 136 01/22/2020   K 4.8 01/22/2020   CO2 26 01/22/2020   GLUCOSE 282 (H) 01/22/2020   BUN 6 01/22/2020   CREATININE 1.05 01/22/2020   BILITOT 0.7 01/22/2020   ALKPHOS 74 01/22/2020   AST 156 (H) 01/22/2020   ALT 102 (H) 01/22/2020   PROT 7.4 01/22/2020   ALBUMIN 4.6 01/22/2020   CALCIUM 9.7 01/22/2020   ANIONGAP 9 02/06/2018   Lab Results  Component Value Date   CHOL 225 (H) 02/13/2019   Lab Results  Component Value Date   HDL 43 02/13/2019   Lab Results  Component Value Date   LDLCALC 150 (H) 02/13/2019   Lab Results  Component Value Date   TRIG 174 (H) 02/13/2019   Lab Results  Component Value Date   CHOLHDL 5.2 (H) 02/13/2019   Lab Results  Component Value Date   HGBA1C 8.4 (H) 01/22/2020

## 2020-08-04 LAB — LIPID PANEL
Chol/HDL Ratio: 4.6 ratio (ref 0.0–5.0)
Cholesterol, Total: 229 mg/dL — ABNORMAL HIGH (ref 100–199)
HDL: 50 mg/dL (ref >39)
LDL Chol Calc (NIH): 155 mg/dL — ABNORMAL HIGH (ref 0–99)
Triglycerides: 134 mg/dL (ref 0–149)
VLDL Cholesterol Cal: 24 mg/dL (ref 5–40)

## 2020-08-04 LAB — CBC WITH DIFFERENTIAL/PLATELET
Basophils Absolute: 0.1 10*3/uL (ref 0.0–0.2)
Basos: 1 %
EOS (ABSOLUTE): 0.2 10*3/uL (ref 0.0–0.4)
Eos: 4 %
Hematocrit: 45.9 % (ref 37.5–51.0)
Hemoglobin: 16.2 g/dL (ref 13.0–17.7)
Immature Grans (Abs): 0 10*3/uL (ref 0.0–0.1)
Immature Granulocytes: 0 %
Lymphocytes Absolute: 1.9 10*3/uL (ref 0.7–3.1)
Lymphs: 35 %
MCH: 31.1 pg (ref 26.6–33.0)
MCHC: 35.3 g/dL (ref 31.5–35.7)
MCV: 88 fL (ref 79–97)
Monocytes Absolute: 0.5 10*3/uL (ref 0.1–0.9)
Monocytes: 8 %
Neutrophils Absolute: 2.9 10*3/uL (ref 1.4–7.0)
Neutrophils: 52 %
Platelets: 196 10*3/uL (ref 150–450)
RBC: 5.21 x10E6/uL (ref 4.14–5.80)
RDW: 11.5 % — ABNORMAL LOW (ref 11.6–15.4)
WBC: 5.5 10*3/uL (ref 3.4–10.8)

## 2020-08-04 LAB — CMP14+EGFR
ALT: 88 IU/L — ABNORMAL HIGH (ref 0–44)
AST: 95 IU/L — ABNORMAL HIGH (ref 0–40)
Albumin/Globulin Ratio: 1.5 (ref 1.2–2.2)
Albumin: 4.6 g/dL (ref 4.0–5.0)
Alkaline Phosphatase: 77 IU/L (ref 44–121)
BUN/Creatinine Ratio: 9 (ref 9–20)
BUN: 9 mg/dL (ref 6–24)
Bilirubin Total: 1.2 mg/dL (ref 0.0–1.2)
CO2: 22 mmol/L (ref 20–29)
Calcium: 9.9 mg/dL (ref 8.7–10.2)
Chloride: 97 mmol/L (ref 96–106)
Creatinine, Ser: 1.03 mg/dL (ref 0.76–1.27)
Globulin, Total: 3.1 g/dL (ref 1.5–4.5)
Glucose: 240 mg/dL — ABNORMAL HIGH (ref 65–99)
Potassium: 4.7 mmol/L (ref 3.5–5.2)
Sodium: 137 mmol/L (ref 134–144)
Total Protein: 7.7 g/dL (ref 6.0–8.5)
eGFR: 94 mL/min/{1.73_m2} (ref >59)

## 2020-08-04 LAB — MICROALBUMIN / CREATININE URINE RATIO
Creatinine, Urine: 285.2 mg/dL
Microalb/Creat Ratio: 17 mg/g creat (ref 0–29)
Microalbumin, Urine: 47.2 ug/mL

## 2020-08-12 IMAGING — CT CT ABD-PELV W/ CM
2 of 4 series · 15 of 46 positions shown, 17 images · IV contrast (Isovue)
Comparison: None.

CLINICAL DATA: Low back pain, left upper quadrant pain,
intermittent dizziness and fever for 2 weeks.

EXAM:
CT ABDOMEN AND PELVIS WITH CONTRAST
TECHNIQUE: Multidetector CT imaging of the abdomen and pelvis was performed
using the standard protocol following bolus administration of
intravenous contrast.
CONTRAST:  100mL EMOECZ-4RR IOPAMIDOL (EMOECZ-4RR) INJECTION 61%

[Series 2: axial st · axial · 0.78mm/px · z∈[+924,+1409]mm · 12 of 107 slices shown, 14 images]
[im 5/107  soft-tissue]
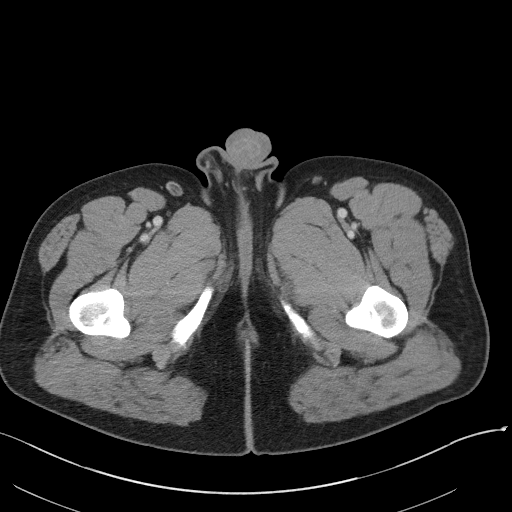
[im 5/107  bone]
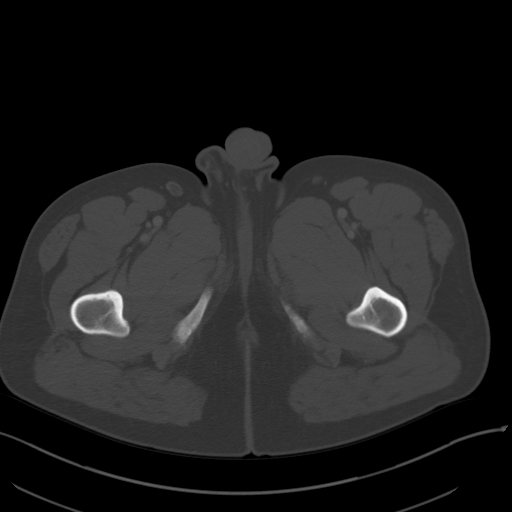
[im 14/107  soft-tissue]
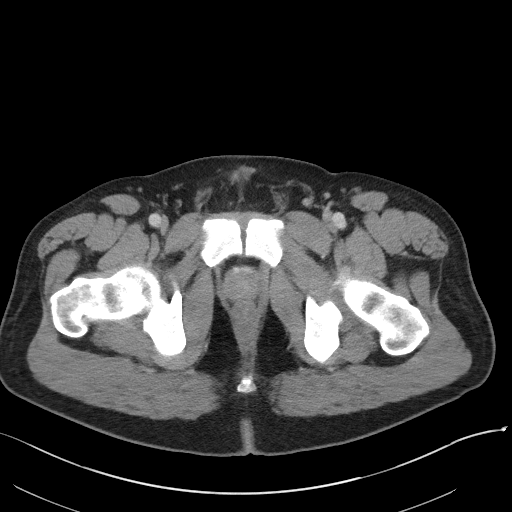
[im 24/107  soft-tissue]
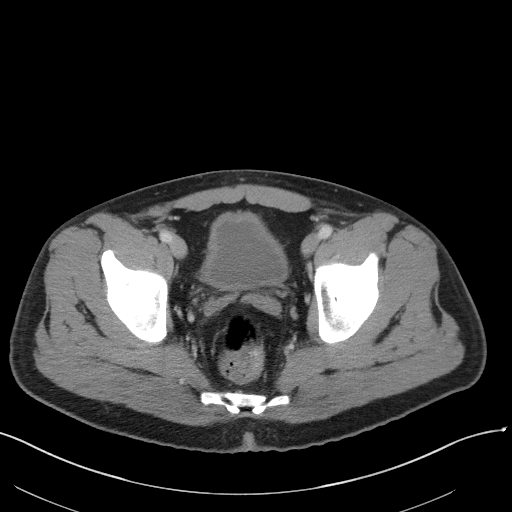
[im 33/107  soft-tissue]
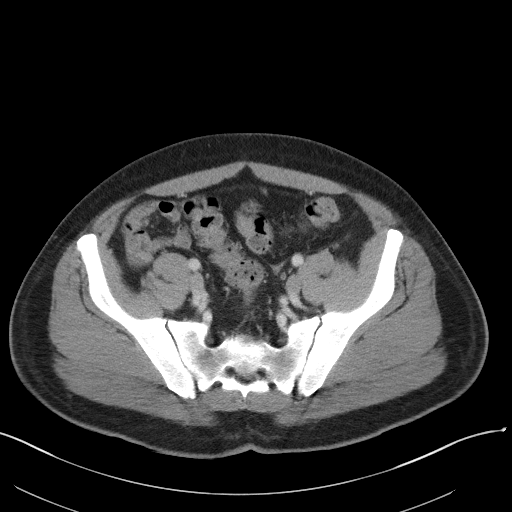
[im 42/107  soft-tissue]
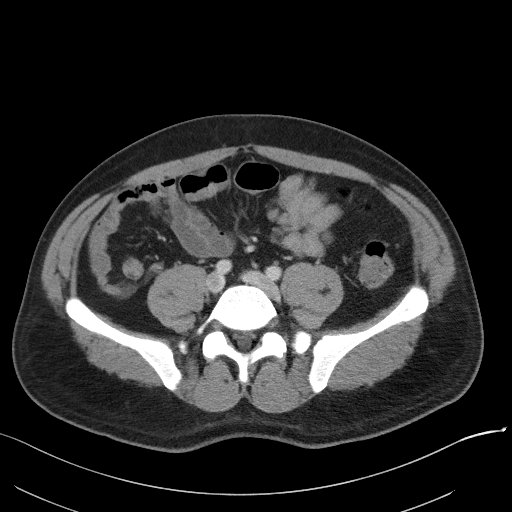
[im 51/107  soft-tissue]
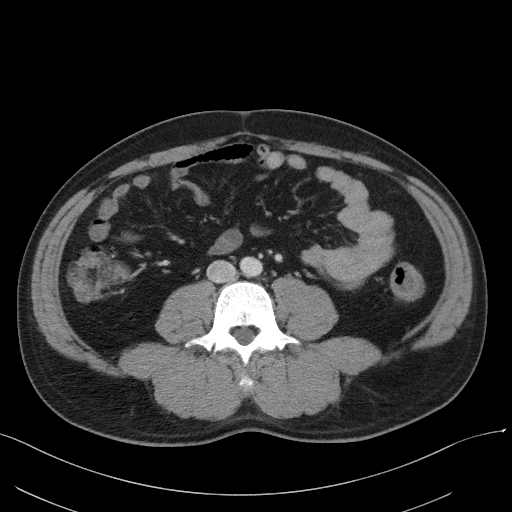
[im 56/107  soft-tissue]
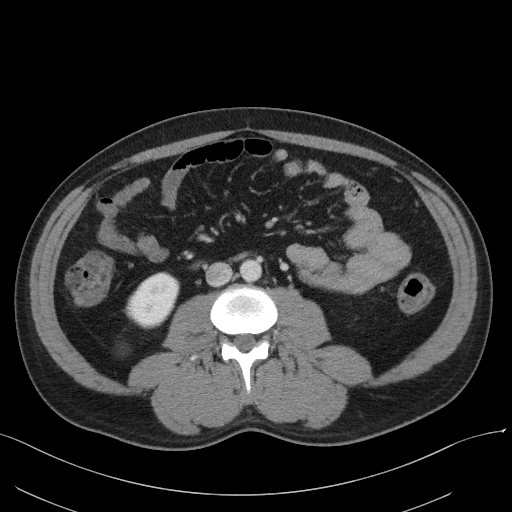
[im 65/107  soft-tissue]
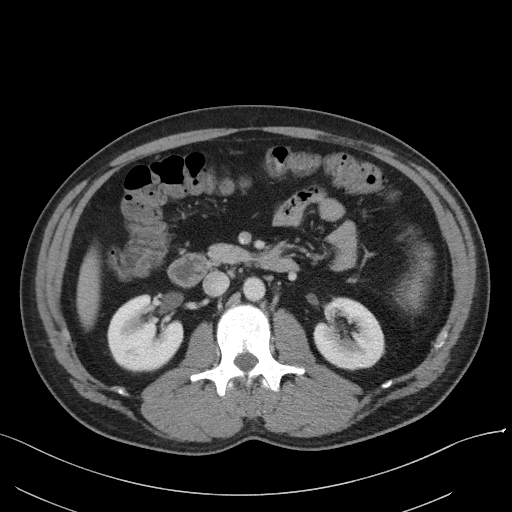
[im 74/107  soft-tissue]
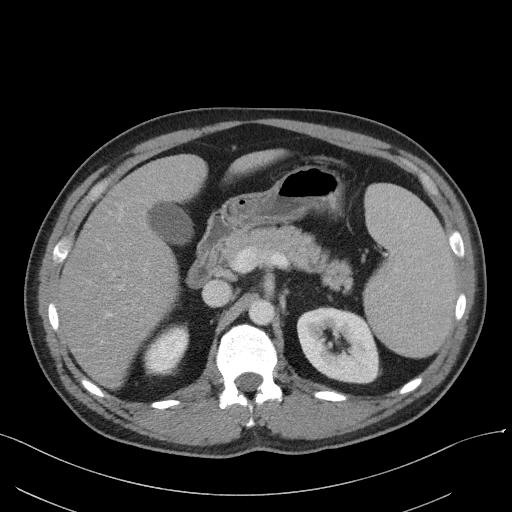
[im 74/107  bone]
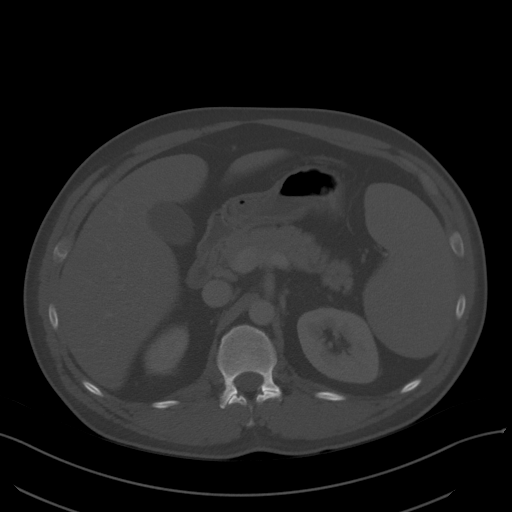
[im 83/107  soft-tissue]
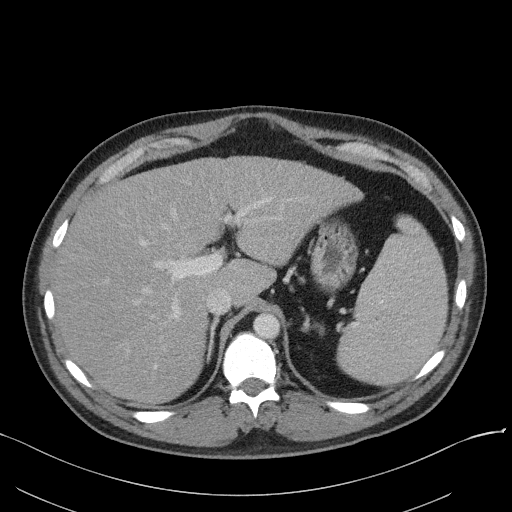
[im 93/107  soft-tissue]
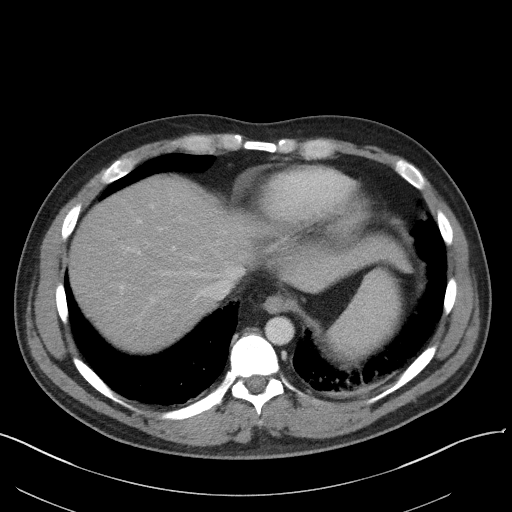
[im 102/107  soft-tissue]
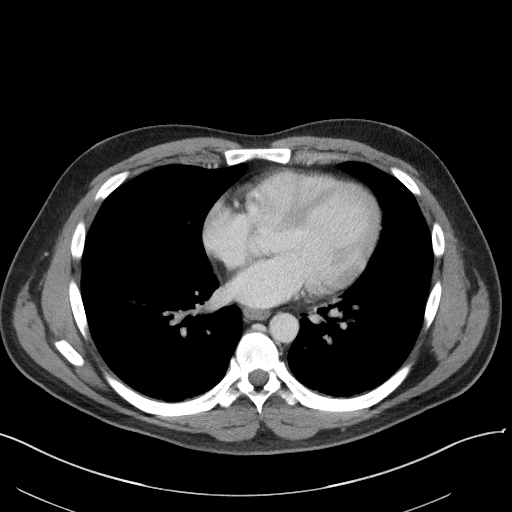

[Series 5: coronal st · coronal · 0.76mm/px · 3 of 94 slices shown]
[im 32/94  soft-tissue]
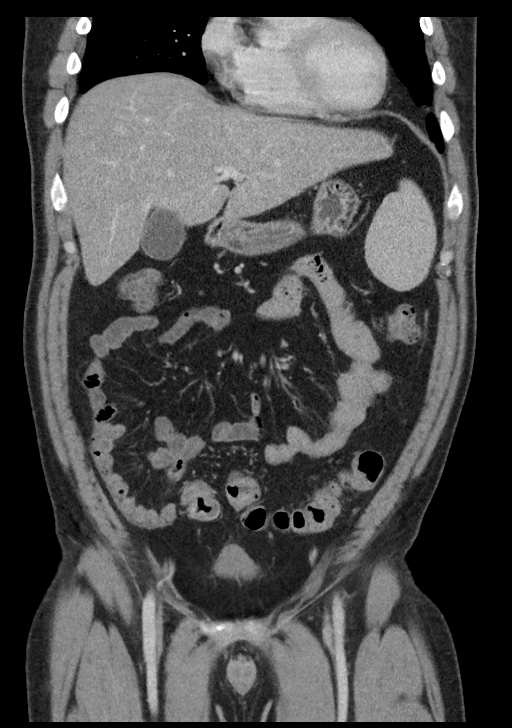
[im 42/94  soft-tissue]
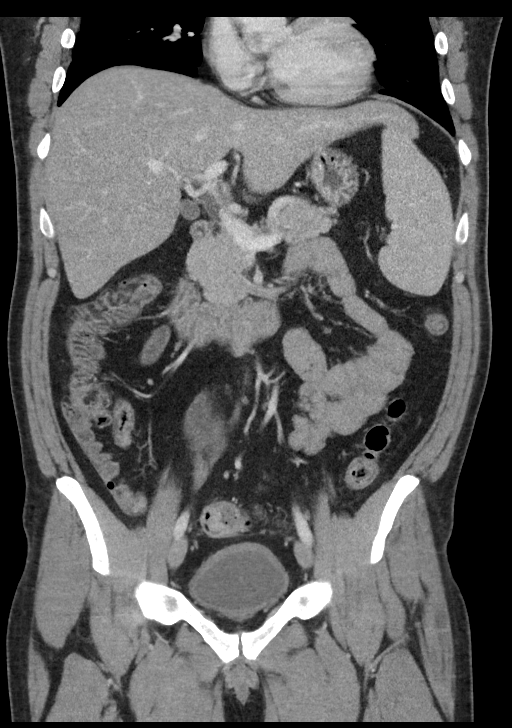
[im 52/94  soft-tissue]
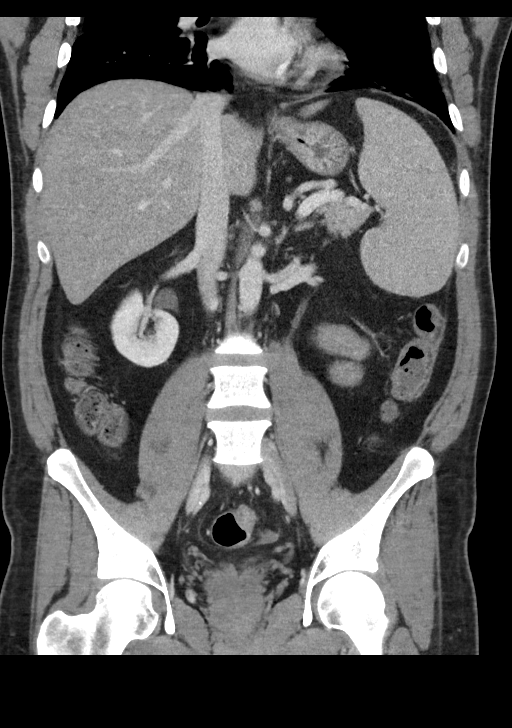

[15 of 46 positions shown; findings below may reference images not displayed]

FINDINGS: Lower chest: The lung bases are clear of acute process. No pleural
effusion or pulmonary lesions. The heart is normal in size. No
pericardial effusion. The distal esophagus and aorta are
unremarkable.

Hepatobiliary: No focal hepatic lesions or intrahepatic biliary
dilatation. The gallbladder is normal. No common bile duct
dilatation.

Pancreas: No mass, inflammation or ductal dilatation.

Spleen: Moderate splenomegaly. The spleen measures 15.5 x 14.0 x
cm. Splenic volume is 803 cubic cm. No focal splenic lesions.

Adrenals/Urinary Tract: The adrenal glands and kidneys are
unremarkable. There is a simple appearing cyst projecting off the
lower pole region of the right kidney posteriorly. No renal,
ureteral or bladder calculi. Mild bladder wall thickening may be due
to lack of distension.

Stomach/Bowel: The stomach, duodenum, small bowel and colon are
grossly normal without oral contrast. No inflammatory changes, mass
lesions or obstructive findings. The terminal ileum and appendix are
normal.

Vascular/Lymphatic: The aorta is normal in caliber. No dissection.
The branch vessels are patent. The major venous structures are
patent. No mesenteric or retroperitoneal mass or adenopathy. Small
scattered lymph nodes are noted.

Reproductive: The prostate gland and seminal vesicles are
unremarkable.

Other: No pelvic mass or adenopathy. No free pelvic fluid
collections. No inguinal mass or adenopathy. No abdominal wall
hernia or subcutaneous lesions.

Musculoskeletal: No significant bony findings.
IMPRESSION: 1. No acute abdominal/pelvic findings, mass lesions or
lymphadenopathy.
2. Splenomegaly.
3. Simple appearing right renal cyst.
4. Mild uniform bladder wall thickening likely due to lack of
distension.

## 2020-09-15 ENCOUNTER — Encounter: Payer: Self-pay | Admitting: Family Medicine

## 2020-09-15 ENCOUNTER — Ambulatory Visit (INDEPENDENT_AMBULATORY_CARE_PROVIDER_SITE_OTHER): Payer: BC Managed Care – PPO | Admitting: Family Medicine

## 2020-09-15 ENCOUNTER — Other Ambulatory Visit: Payer: Self-pay

## 2020-09-15 ENCOUNTER — Encounter: Payer: Self-pay | Admitting: *Deleted

## 2020-09-15 VITALS — BP 134/84 | HR 78 | Temp 98.0°F | Ht 69.0 in | Wt 206.2 lb

## 2020-09-15 DIAGNOSIS — E1169 Type 2 diabetes mellitus with other specified complication: Secondary | ICD-10-CM | POA: Diagnosis not present

## 2020-09-15 DIAGNOSIS — I10 Essential (primary) hypertension: Secondary | ICD-10-CM | POA: Diagnosis not present

## 2020-09-15 DIAGNOSIS — F411 Generalized anxiety disorder: Secondary | ICD-10-CM

## 2020-09-15 DIAGNOSIS — E785 Hyperlipidemia, unspecified: Secondary | ICD-10-CM

## 2020-09-15 DIAGNOSIS — E669 Obesity, unspecified: Secondary | ICD-10-CM

## 2020-09-15 MED ORDER — OZEMPIC (0.25 OR 0.5 MG/DOSE) 2 MG/1.5ML ~~LOC~~ SOPN: 0.5000 mg | PEN_INJECTOR | SUBCUTANEOUS | 1 refills | Status: DC

## 2020-09-15 MED ORDER — ATORVASTATIN CALCIUM 10 MG PO TABS: 10.0000 mg | ORAL_TABLET | Freq: Every day | ORAL | 2 refills | Status: DC

## 2020-09-15 NOTE — Progress Notes (Signed)
Assessment & Plan:  1. DM type 2 with diabetic dyslipidemia (HCC) Lab Results  Component Value Date   HGBA1C 8.5 (H) 08/03/2020   HGBA1C 8.4 (H) 01/22/2020   HGBA1C 8.7 (H) 08/14/2019    - Diabetes is not at goal of A1c < 7. - Medications: increase Ozempic from 0.25 mg to 0.5 mg once weekly. - Home glucose monitoring: encouraged to monitor - Patient is currently taking a statin. Patient is not taking an ACE-inhibitor/ARB.  - Instruction/counseling given: reminded to get eye exam and discussed the need for weight loss  Diabetes Health Maintenance Due  Topic Date Due  . OPHTHALMOLOGY EXAM  Never done  . HEMOGLOBIN A1C  11/03/2020  . FOOT EXAM  08/03/2021  . URINE MICROALBUMIN  08/03/2021    Lab Results  Component Value Date   LABMICR 47.2 08/03/2020   LABMICR 11.5 02/13/2019   - Semaglutide,0.25 or 0.5MG /DOS, (OZEMPIC, 0.25 OR 0.5 MG/DOSE,) 2 MG/1.5ML SOPN; Inject 0.5 mg into the skin once a week.  Dispense: 3 mL; Refill: 1 - atorvastatin (LIPITOR) 10 MG tablet; Take 1 tablet (10 mg total) by mouth daily.  Dispense: 30 tablet; Refill: 2  2. Essential hypertension Improving. Encouraged to monitor BP at home, keep a log, and bring it with him to his next appointment.  3. Anxiety, generalized Well controlled on current regimen.   4. Dyslipidemia Rx'd atorvastatin. - atorvastatin (LIPITOR) 10 MG tablet; Take 1 tablet (10 mg total) by mouth daily.  Dispense: 30 tablet; Refill: 2  5. Obesity (BMI 30-39.9) Increased Ozempic from 0.25 mg to 0.5 mg once weekly. Encouraged diet and exercise.  - Semaglutide,0.25 or 0.5MG /DOS, (OZEMPIC, 0.25 OR 0.5 MG/DOSE,) 2 MG/1.5ML SOPN; Inject 0.5 mg into the skin once a week.  Dispense: 3 mL; Refill: 1   Return in about 7 weeks (around 11/03/2020) for follow-up of chronic medication conditions.  Deliah Boston, MSN, APRN, FNP-C [facility]  Subjective:    [name], male    DOB: [birth year], 40 y.o.    MRN: [id number]  Patient Care Team: Gwenlyn Fudge, FNP as PCP - General (Family Medicine) Jena Gauss Gerrit Friends, MD as Consulting Physician (Gastroenterology) Michaelle Copas, MD as Referring Physician (Optometry)   Chief Complaint:  Chief Complaint  Patient presents with  . Diabetes  . Hypertension    6 week follow up    HPI: Randall Murphy is a 40 y.o. male presenting on 09/15/2020 for Diabetes and Hypertension (6 week follow up)  Diabetes: Patient presents for follow up of diabetes. Current symptoms include: hyperglycemia. Known diabetic complications: none. Medication compliance: patient has been using Ozempic once weekly and is tolerating it well. Current diet: in general, an "unhealthy" diet. Current exercise: none. Home blood sugar records: patient does not check sugars. Is he  on ACE inhibitor or angiotensin II receptor blocker? No. Is he on a statin? No.   Hypertension: patient has been taking amlodipine daily as prescribed. He does not check his BP at home. No exercise.   Anxiety: feels he is doing well with Lexapro 10 mg once daily. Does not need a dosage change.  Depression screen [facility] 2/9 09/15/2020 08/03/2020 01/22/2020  Decreased Interest 0 0 0  Down, Depressed, Hopeless 0 0 0  PHQ - 2 Score 0 0 0  Altered sleeping 0 0 0  Tired, decreased energy 1 0 0  Change in appetite 1 0 0  Feeling bad or failure about yourself  1  0 0  Trouble concentrating 2 2 2   Moving slowly or fidgety/restless 0 0 0  Suicidal thoughts 0 0 0  PHQ-9 Score 5 2 2   Difficult doing work/chores Not difficult at all Somewhat difficult -   GAD 7 : Generalized Anxiety Score 09/15/2020 08/03/2020 01/22/2020 08/17/2019  Nervous, Anxious, on Edge 1 1 1 1   Control/stop worrying 1 0 0 1  Worry too much - different things 1 1 1 1   Trouble relaxing 0 0 1 0  Restless 0 2 1 0  Easily annoyed or irritable 0 3 0 1  Afraid - awful might happen 0 0 0 0  Total GAD 7 Score 3 7 4 4   Anxiety Difficulty Not difficult at  all Somewhat difficult - Not difficult at all   New complaints: None  Social history:  Relevant past medical, surgical, family and social history reviewed and updated as indicated. Interim medical history since our last visit reviewed.  Allergies and medications reviewed and updated.  DATA REVIEWED: CHART IN EPIC  ROS: Negative unless specifically indicated above in HPI.    Current Outpatient Medications:  .  amLODipine (NORVASC) 10 MG tablet, TAKE 1 TABLET BY MOUTH EVERY DAY, Disp: 90 tablet, Rfl: 1 .  escitalopram (LEXAPRO) 10 MG tablet, TAKE 1 TABLET BY MOUTH EVERYDAY AT BEDTIME, Disp: 90 tablet, Rfl: 1 .  Semaglutide,0.25 or 0.5MG /DOS, (OZEMPIC, 0.25 OR 0.5 MG/DOSE,) 2 MG/1.5ML SOPN, Inject 0.25 mg into the skin once a week., Disp: 1.5 mL, Rfl: 0   No Known Allergies Past Medical History:  Diagnosis Date  . ADD (attention deficit disorder) 02/22/2015  . Anxiety, generalized 02/15/2016  . Diabetes mellitus without complication (HCC)   . Dyslipidemia 02/22/2015  . Elevated liver enzymes   . Fatty liver 03/23/2015  . Hypertension     History reviewed. No pertinent surgical history.  Social History   Socioeconomic History  . Marital status: Divorced    Spouse name: Not on file  . Number of children: 2  . Years of education: Not on file  . Highest education level: Not on file  Occupational History  . Not on file  Tobacco Use  . Smoking status: Never Smoker  . Smokeless tobacco: Never Used  Vaping Use  . Vaping Use: Never used  Substance and Sexual Activity  . Alcohol use: Yes    Alcohol/week: 10.0 standard drinks    Types: 10 Cans of beer per week    Comment: daily  . Drug use: Never  . Sexual activity: Yes    Birth control/protection: Condom  Other Topics Concern  . Not on file  Social History Narrative  . Not on file   Social Determinants of Health   Financial Resource Strain: Not on file  Food Insecurity: Not on file  Transportation Needs: Not on file   Physical Activity: Not on file  Stress: Not on file  Social Connections: Not on file  Intimate Partner Violence: Not on file        Objective:    BP 134/84   Pulse 78   Temp 98 F (36.7 C) (Temporal)   Ht 5\' 9"  (1.753 m)   Wt 206 lb 3.2 oz (93.5 kg)   SpO2 100%   BMI 30.45 kg/m   Wt Readings from Last 3 Encounters:  09/15/20 206 lb 3.2 oz (93.5 kg)  08/03/20 205 lb (93 kg)  01/22/20 206 lb (93.4 kg)    Physical Exam Vitals reviewed.  Constitutional:  General: He is not in acute distress.    Appearance: Normal appearance. He is obese. He is not ill-appearing, toxic-appearing or diaphoretic.  HENT:     Head: Normocephalic and atraumatic.  Eyes:     General: No scleral icterus.       Right eye: No discharge.        Left eye: No discharge.     Conjunctiva/sclera: Conjunctivae normal.  Cardiovascular:     Rate and Rhythm: Normal rate and regular rhythm.     Heart sounds: Normal heart sounds. No murmur heard. No friction rub. No gallop.   Pulmonary:     Effort: Pulmonary effort is normal. No respiratory distress.     Breath sounds: Normal breath sounds. No stridor. No wheezing, rhonchi or rales.  Musculoskeletal:        General: Normal range of motion.     Cervical back: Normal range of motion.  Skin:    General: Skin is warm and dry.  Neurological:     Mental Status: He is alert and oriented to person, place, and time. Mental status is at baseline.  Psychiatric:        Mood and Affect: Mood normal.        Behavior: Behavior normal.        Thought Content: Thought content normal.        Judgment: Judgment normal.     No results found for: TSH Lab Results  Component Value Date   WBC 5.5 08/03/2020   HGB 16.2 08/03/2020   HCT 45.9 08/03/2020   MCV 88 08/03/2020   PLT 196 08/03/2020   Lab Results  Component Value Date   NA 137 08/03/2020   K 4.7 08/03/2020   CO2 22 08/03/2020   GLUCOSE 240 (H) 08/03/2020   BUN 9 08/03/2020   CREATININE 1.03  08/03/2020   BILITOT 1.2 08/03/2020   ALKPHOS 77 08/03/2020   AST 95 (H) 08/03/2020   ALT 88 (H) 08/03/2020   PROT 7.7 08/03/2020   ALBUMIN 4.6 08/03/2020   CALCIUM 9.9 08/03/2020   ANIONGAP 9 02/06/2018   Lab Results  Component Value Date   CHOL 229 (H) 08/03/2020   Lab Results  Component Value Date   HDL 50 08/03/2020   Lab Results  Component Value Date   LDLCALC 155 (H) 08/03/2020   Lab Results  Component Value Date   TRIG 134 08/03/2020   Lab Results  Component Value Date   CHOLHDL 4.6 08/03/2020   Lab Results  Component Value Date   HGBA1C 8.5 (H) 08/03/2020

## 2020-10-28 ENCOUNTER — Ambulatory Visit: Payer: BC Managed Care – PPO | Admitting: Family Medicine

## 2020-10-29 ENCOUNTER — Encounter: Payer: Self-pay | Admitting: Family Medicine

## 2020-10-29 ENCOUNTER — Other Ambulatory Visit: Payer: Self-pay

## 2020-10-29 ENCOUNTER — Ambulatory Visit: Payer: BC Managed Care – PPO | Admitting: Family Medicine

## 2020-10-29 VITALS — BP 135/90 | HR 104 | Temp 98.3°F | Ht 69.0 in | Wt 205.2 lb

## 2020-10-29 DIAGNOSIS — I10 Essential (primary) hypertension: Secondary | ICD-10-CM

## 2020-10-29 DIAGNOSIS — E1169 Type 2 diabetes mellitus with other specified complication: Secondary | ICD-10-CM | POA: Diagnosis not present

## 2020-10-29 DIAGNOSIS — F411 Generalized anxiety disorder: Secondary | ICD-10-CM | POA: Diagnosis not present

## 2020-10-29 DIAGNOSIS — F900 Attention-deficit hyperactivity disorder, predominantly inattentive type: Secondary | ICD-10-CM

## 2020-10-29 DIAGNOSIS — E669 Obesity, unspecified: Secondary | ICD-10-CM

## 2020-10-29 DIAGNOSIS — E785 Hyperlipidemia, unspecified: Secondary | ICD-10-CM

## 2020-10-29 LAB — BAYER DCA HB A1C WAIVED: HB A1C (BAYER DCA - WAIVED): 7.5 % — ABNORMAL HIGH (ref <7.0)

## 2020-10-29 MED ORDER — LISDEXAMFETAMINE DIMESYLATE 30 MG PO CAPS: 30.0000 mg | ORAL_CAPSULE | Freq: Every day | ORAL | 0 refills | Status: DC

## 2020-10-29 MED ORDER — OZEMPIC (1 MG/DOSE) 4 MG/3ML ~~LOC~~ SOPN: 1.0000 mg | PEN_INJECTOR | SUBCUTANEOUS | 1 refills | Status: DC

## 2020-10-29 MED ORDER — PRAVASTATIN SODIUM 40 MG PO TABS: 40.0000 mg | ORAL_TABLET | Freq: Every day | ORAL | 2 refills | Status: DC

## 2020-10-29 NOTE — Progress Notes (Signed)
Assessment & Plan:  1. DM type 2 with diabetic dyslipidemia (HCC) Lab Results  Component Value Date   HGBA1C 7.5 (H) 10/29/2020   HGBA1C 8.5 (H) 08/03/2020   HGBA1C 8.4 (H) 01/22/2020    - Diabetes is not at goal of A1c < 7, but is improving. - Medications:  increase Ozempic from 0.5 mg to 1 mg once weekly - Home glucose monitoring: not monitoring - Patient is currently taking a statin. Patient is not taking an ACE-inhibitor/ARB.  - Instruction/counseling given: reminded to get eye exam and discussed diet  Diabetes Health Maintenance Due  Topic Date Due   OPHTHALMOLOGY EXAM  Never done   HEMOGLOBIN A1C  01/29/2021   FOOT EXAM  08/03/2021   URINE MICROALBUMIN  08/03/2021    Lab Results  Component Value Date   LABMICR 47.2 08/03/2020   LABMICR 11.5 02/13/2019   - Lipid panel - CMP14+EGFR - CBC with Differential/Platelet - Bayer DCA Hb A1c Waived - pravastatin (PRAVACHOL) 40 MG tablet; Take 1 tablet (40 mg total) by mouth daily.  Dispense: 30 tablet; Refill: 2 - Semaglutide, 1 MG/DOSE, (OZEMPIC, 1 MG/DOSE,) 4 MG/3ML SOPN; Inject 1 mg into the skin once a week.  Dispense: 6 mL; Refill: 1  2. Dyslipidemia Labs to assess. Atorvastatin changed to Pravastatin due to side effects.  - Lipid panel - CMP14+EGFR - pravastatin (PRAVACHOL) 40 MG tablet; Take 1 tablet (40 mg total) by mouth daily.  Dispense: 30 tablet; Refill: 2  3. Essential hypertension Well controlled on current regimen.  - Lipid panel - CMP14+EGFR - CBC with Differential/Platelet  4. Anxiety, generalized Well controlled on current regimen.  - CMP14+EGFR  5. Attention deficit hyperactivity disorder (ADHD), predominantly inattentive type Started patient on Vyvanse 30 mg once daily. He has tolerated Vyvanse in the past. PDMP reivewed with no concerning findings. Will complete controlled substance agreement and urine drug screen at his next visit - lisdexamfetamine (VYVANSE) 30 MG capsule; Take 1 capsule (30 mg  total) by mouth daily.  Dispense: 30 capsule; Refill: 0 - lisdexamfetamine (VYVANSE) 30 MG capsule; Take 1 capsule (30 mg total) by mouth daily.  Dispense: 30 capsule; Refill: 0 - lisdexamfetamine (VYVANSE) 30 MG capsule; Take 1 capsule (30 mg total) by mouth daily.  Dispense: 30 capsule; Refill: 0  6. Obesity (BMI 30-39.9) Diet and exercise encouraged. Ozempic increased from 0.5 mg to 1 mg once weekly.    Return in about 3 months (around 01/29/2021) for DM & ADD.  Hendricks Limes, MSN, APRN, FNP-C [facility]  Subjective:    [name], male    DOB: 12-02-1980, 40 y.o.   MRN: [id number]  Patient Care Team: Loman Brooklyn, FNP as PCP - General (Family Medicine) Gala Romney Cristopher Estimable, MD as Consulting Physician (Gastroenterology) Harlen Labs, MD as Referring Physician (Optometry)   Chief Complaint:  Chief Complaint  Patient presents with   Diabetes   Hypertension    7 week check up of chronic medical conditions     HPI: Randall Murphy is a 40 y.o. male presenting on 10/29/2020 for Diabetes and Hypertension (7 week check up of chronic medical conditions )  Diabetes: Patient presents for follow up of diabetes. Current symptoms include: hyperglycemia. Known diabetic complications: none. Medication compliance: patient has been using Ozempic once weekly and is tolerating it well. Current diet: in general, an "unhealthy" diet. Current exercise: none. Home blood sugar records: patient does not check sugars. Is he  on  ACE inhibitor or angiotensin II receptor blocker? No. Is he on a statin? Yes.   Hyperlipidemia: patient does not like the way he has been feeling since starting Atorvastatin. Describes feeling more fatigued.  Hypertension: patient has been taking amlodipine daily as prescribed. He does not check his BP at home. No exercise.   Anxiety: feels he is doing well with Lexapro 10 mg once daily. Does not need a dosage change.  Depression screen  [facility] 2/9 10/29/2020 09/15/2020 08/03/2020  Decreased Interest 0 0 0  Down, Depressed, Hopeless 0 0 0  PHQ - 2 Score 0 0 0  Altered sleeping 2 0 0  Tired, decreased energy 2 1 0  Change in appetite 0 1 0  Feeling bad or failure about yourself  1 1 0  Trouble concentrating '2 2 2  ' Moving slowly or fidgety/restless 0 0 0  Suicidal thoughts 0 0 0  PHQ-9 Score '7 5 2  ' Difficult doing work/chores Somewhat difficult Not difficult at all Somewhat difficult   GAD 7 : Generalized Anxiety Score 10/29/2020 09/15/2020 08/03/2020 01/22/2020  Nervous, Anxious, on Edge 0 '1 1 1  ' Control/stop worrying 0 1 0 0  Worry too much - different things 0 '1 1 1  ' Trouble relaxing 0 0 0 1  Restless 1 0 2 1  Easily annoyed or irritable 1 0 3 0  Afraid - awful might happen 0 0 0 0  Total GAD 7 Score '2 3 7 4  ' Anxiety Difficulty Not difficult at all Not difficult at all Somewhat difficult -   New complaints: Patient is concerned about his lack of focus. He feels he is all over the place at work. He works in [employer], technical sales. States he was diagnosed with ADD as a teenager and has taken medication intermittently since then. When he is working a fast paced job he is able to keep up and does not need the medication. 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 He has tried Oncologist and did not feel it was helpful for him.    Social history:  Relevant past medical, surgical, family and social history reviewed and updated as indicated. Interim medical history since our last visit reviewed.  Allergies and medications reviewed and updated.  DATA REVIEWED: CHART IN EPIC  ROS: Negative unless specifically indicated above in HPI.    Current Outpatient Medications:    amLODipine (NORVASC) 10 MG tablet, TAKE 1 TABLET BY MOUTH EVERY DAY, Disp: 90 tablet, Rfl: 1   atorvastatin (LIPITOR) 10 MG tablet, Take 1 tablet (10 mg total) by mouth daily., Disp: 30 tablet, Rfl: 2   escitalopram (LEXAPRO) 10 MG tablet, TAKE 1 TABLET  BY MOUTH EVERYDAY AT BEDTIME, Disp: 90 tablet, Rfl: 1   Semaglutide,0.25 or 0.5MG/DOS, (OZEMPIC, 0.25 OR 0.5 MG/DOSE,) 2 MG/1.5ML SOPN, Inject 0.5 mg into the skin once a week., Disp: 3 mL, Rfl: 1   No Known Allergies Past Medical History:  Diagnosis Date   ADD (attention deficit disorder) 02/22/2015   Anxiety, generalized 02/15/2016   Diabetes mellitus without complication ([place])    Dyslipidemia 02/22/2015   Elevated liver enzymes    Fatty liver 03/23/2015   Hypertension     History reviewed. No pertinent surgical history.  Social History   Socioeconomic History   Marital status: Married    Spouse name: Not on file   Number of children: 2   Years of education: Not on file   Highest education level: Not on file  Occupational History   Not on file  Tobacco Use   Smoking status: Never Smoker   Smokeless tobacco: Never Used  Vaping Use   Vaping Use: Never used  Substance and Sexual Activity   Alcohol use: Yes    Alcohol/week: 10.0 standard drinks    Types: 10 Cans of beer per week    Comment: daily   Drug use: Never   Sexual activity: Yes    Birth control/protection: Condom  Other Topics Concern   Not on file  Social History Narrative   Not on file   Social Determinants of Health   Financial Resource Strain: Not on file  Food Insecurity: Not on file  Transportation Needs: Not on file  Physical Activity: Not on file  Stress: Not on file  Social Connections: Not on file  Intimate Partner Violence: Not on file        Objective:    BP 135/90   Pulse (!) 104   Temp 98.3 F (36.8 C) (Temporal)   Ht '5\' 9"'  (1.753 m)   Wt 205 lb 3.2 oz (93.1 kg)   SpO2 100%   BMI 30.30 kg/m   Wt Readings from Last 3 Encounters:  10/29/20 205 lb 3.2 oz (93.1 kg)  09/15/20 206 lb 3.2 oz (93.5 kg)  08/03/20 205 lb (93 kg)    Physical Exam Vitals reviewed.  Constitutional:      General: He is not in acute distress.    Appearance: Normal appearance. He is obese. He is not  ill-appearing, toxic-appearing or diaphoretic.  HENT:     Head: Normocephalic and atraumatic.  Eyes:     General: No scleral icterus.       Right eye: No discharge.        Left eye: No discharge.     Conjunctiva/sclera: Conjunctivae normal.  Cardiovascular:     Rate and Rhythm: Normal rate and regular rhythm.     Heart sounds: Normal heart sounds. No murmur heard.   No friction rub. No gallop.  Pulmonary:     Effort: Pulmonary effort is normal. No respiratory distress.     Breath sounds: Normal breath sounds. No stridor. No wheezing, rhonchi or rales.  Musculoskeletal:        General: Normal range of motion.     Cervical back: Normal range of motion.  Skin:    General: Skin is warm and dry.  Neurological:     Mental Status: He is alert and oriented to person, place, and time. Mental status is at baseline.  Psychiatric:        Mood and Affect: Mood normal.        Behavior: Behavior normal.        Thought Content: Thought content normal.        Judgment: Judgment normal.    No results found for: TSH Lab Results  Component Value Date   WBC 5.5 08/03/2020   HGB 16.2 08/03/2020   HCT 45.9 08/03/2020   MCV 88 08/03/2020   PLT 196 08/03/2020   Lab Results  Component Value Date   NA 137 08/03/2020   K 4.7 08/03/2020   CO2 22 08/03/2020   GLUCOSE 240 (H) 08/03/2020   BUN 9 08/03/2020   CREATININE 1.03 08/03/2020   BILITOT 1.2 08/03/2020   ALKPHOS 77 08/03/2020   AST 95 (H) 08/03/2020   ALT 88 (H) 08/03/2020   PROT 7.7 08/03/2020   ALBUMIN 4.6 08/03/2020   CALCIUM 9.9 08/03/2020   ANIONGAP 9  02/06/2018   EGFR 94 08/03/2020   Lab Results  Component Value Date   CHOL 229 (H) 08/03/2020   Lab Results  Component Value Date   HDL 50 08/03/2020   Lab Results  Component Value Date   LDLCALC 155 (H) 08/03/2020   Lab Results  Component Value Date   TRIG 134 08/03/2020   Lab Results  Component Value Date   CHOLHDL 4.6 08/03/2020   Lab Results  Component Value  Date   HGBA1C 8.5 (H) 08/03/2020

## 2020-10-30 LAB — CBC WITH DIFFERENTIAL/PLATELET
Basophils Absolute: 0.1 10*3/uL (ref 0.0–0.2)
Basos: 1 %
EOS (ABSOLUTE): 0.1 10*3/uL (ref 0.0–0.4)
Eos: 2 %
Hematocrit: 44.8 % (ref 37.5–51.0)
Hemoglobin: 14.8 g/dL (ref 13.0–17.7)
Immature Grans (Abs): 0 10*3/uL (ref 0.0–0.1)
Immature Granulocytes: 0 %
Lymphocytes Absolute: 1.8 10*3/uL (ref 0.7–3.1)
Lymphs: 29 %
MCH: 29.9 pg (ref 26.6–33.0)
MCHC: 33 g/dL (ref 31.5–35.7)
MCV: 91 fL (ref 79–97)
Monocytes Absolute: 0.5 10*3/uL (ref 0.1–0.9)
Monocytes: 8 %
Neutrophils Absolute: 3.7 10*3/uL (ref 1.4–7.0)
Neutrophils: 60 %
Platelets: 176 10*3/uL (ref 150–450)
RBC: 4.95 x10E6/uL (ref 4.14–5.80)
RDW: 12.2 % (ref 11.6–15.4)
WBC: 6.2 10*3/uL (ref 3.4–10.8)

## 2020-10-30 LAB — CMP14+EGFR
ALT: 54 IU/L — ABNORMAL HIGH (ref 0–44)
AST: 75 IU/L — ABNORMAL HIGH (ref 0–40)
Albumin/Globulin Ratio: 1.8 (ref 1.2–2.2)
Albumin: 4.8 g/dL (ref 4.0–5.0)
Alkaline Phosphatase: 75 IU/L (ref 44–121)
BUN/Creatinine Ratio: 10 (ref 9–20)
BUN: 8 mg/dL (ref 6–24)
Bilirubin Total: 0.7 mg/dL (ref 0.0–1.2)
CO2: 24 mmol/L (ref 20–29)
Calcium: 9.7 mg/dL (ref 8.7–10.2)
Chloride: 98 mmol/L (ref 96–106)
Creatinine, Ser: 0.81 mg/dL (ref 0.76–1.27)
Globulin, Total: 2.6 g/dL (ref 1.5–4.5)
Glucose: 236 mg/dL — ABNORMAL HIGH (ref 65–99)
Potassium: 4 mmol/L (ref 3.5–5.2)
Sodium: 138 mmol/L (ref 134–144)
Total Protein: 7.4 g/dL (ref 6.0–8.5)
eGFR: 114 mL/min/{1.73_m2} (ref >59)

## 2020-10-30 LAB — LIPID PANEL
Chol/HDL Ratio: 3.2 ratio (ref 0.0–5.0)
Cholesterol, Total: 152 mg/dL (ref 100–199)
HDL: 47 mg/dL (ref >39)
LDL Chol Calc (NIH): 70 mg/dL (ref 0–99)
Triglycerides: 211 mg/dL — ABNORMAL HIGH (ref 0–149)
VLDL Cholesterol Cal: 35 mg/dL (ref 5–40)

## 2020-11-03 ENCOUNTER — Ambulatory Visit: Payer: BC Managed Care – PPO | Admitting: Family Medicine

## 2020-11-30 ENCOUNTER — Encounter: Payer: Self-pay | Admitting: Family Medicine

## 2021-01-06 ENCOUNTER — Encounter: Payer: Self-pay | Admitting: Family Medicine

## 2021-01-10 ENCOUNTER — Encounter: Payer: Self-pay | Admitting: Family Medicine

## 2021-01-22 ENCOUNTER — Other Ambulatory Visit: Payer: Self-pay | Admitting: Family Medicine

## 2021-01-22 DIAGNOSIS — E785 Hyperlipidemia, unspecified: Secondary | ICD-10-CM

## 2021-01-22 DIAGNOSIS — F411 Generalized anxiety disorder: Secondary | ICD-10-CM

## 2021-01-22 DIAGNOSIS — I10 Essential (primary) hypertension: Secondary | ICD-10-CM

## 2021-01-22 DIAGNOSIS — E1169 Type 2 diabetes mellitus with other specified complication: Secondary | ICD-10-CM

## 2021-01-27 ENCOUNTER — Ambulatory Visit: Payer: BC Managed Care – PPO | Admitting: Family Medicine

## 2021-01-27 ENCOUNTER — Encounter: Payer: Self-pay | Admitting: Family Medicine

## 2021-01-27 ENCOUNTER — Other Ambulatory Visit: Payer: Self-pay

## 2021-01-27 VITALS — BP 134/85 | HR 107 | Temp 98.6°F | Ht 69.0 in | Wt 200.0 lb

## 2021-01-27 DIAGNOSIS — F900 Attention-deficit hyperactivity disorder, predominantly inattentive type: Secondary | ICD-10-CM | POA: Diagnosis not present

## 2021-01-27 DIAGNOSIS — E785 Hyperlipidemia, unspecified: Secondary | ICD-10-CM

## 2021-01-27 DIAGNOSIS — I10 Essential (primary) hypertension: Secondary | ICD-10-CM

## 2021-01-27 DIAGNOSIS — E1169 Type 2 diabetes mellitus with other specified complication: Secondary | ICD-10-CM | POA: Diagnosis not present

## 2021-01-27 DIAGNOSIS — E663 Overweight: Secondary | ICD-10-CM

## 2021-01-27 DIAGNOSIS — F411 Generalized anxiety disorder: Secondary | ICD-10-CM

## 2021-01-27 LAB — BAYER DCA HB A1C WAIVED: HB A1C (BAYER DCA - WAIVED): 6.9 % (ref <7.0)

## 2021-01-27 MED ORDER — LISDEXAMFETAMINE DIMESYLATE 40 MG PO CAPS: 40.0000 mg | ORAL_CAPSULE | Freq: Every day | ORAL | 0 refills | Status: DC

## 2021-01-27 MED ORDER — AMLODIPINE BESYLATE 10 MG PO TABS: 10.0000 mg | ORAL_TABLET | Freq: Every day | ORAL | 1 refills | Status: DC

## 2021-01-27 MED ORDER — ESCITALOPRAM OXALATE 10 MG PO TABS: ORAL_TABLET | ORAL | 1 refills | Status: DC

## 2021-01-27 MED ORDER — PRAVASTATIN SODIUM 40 MG PO TABS: 40.0000 mg | ORAL_TABLET | Freq: Every day | ORAL | 1 refills | Status: DC

## 2021-01-27 MED ORDER — FREESTYLE LIBRE 2 SENSOR MISC: 1.0000 | 2 refills | Status: DC

## 2021-01-27 MED ORDER — SEMAGLUTIDE (2 MG/DOSE) 8 MG/3ML ~~LOC~~ SOPN: 2.0000 mg | PEN_INJECTOR | SUBCUTANEOUS | 1 refills | Status: DC

## 2021-01-27 NOTE — Patient Instructions (Signed)
Please schedule your diabetic eye exam.

## 2021-01-27 NOTE — Progress Notes (Signed)
Assessment & Plan:  1. DM type 2 with diabetic dyslipidemia ([place]) Lab Results  Component Value Date   HGBA1C 6.9 01/27/2021   HGBA1C 7.5 (H) 10/29/2020   HGBA1C 8.5 (H) 08/03/2020    - Diabetes is at goal of A1c < 7. - Medications:  increased ozempic to 13m weekly to optimize weight loss - Home glucose monitoring: added freestyle libre so he can check his blood sugar at home - Patient is currently taking a statin. Patient is not taking an ACE-inhibitor/ARB.  - Instruction/counseling given: reminded to get eye exam and discussed the need for weight loss  Diabetes Health Maintenance Due  Topic Date Due   OPHTHALMOLOGY EXAM  Never done   HEMOGLOBIN A1C  04/28/2021   FOOT EXAM  08/03/2021   URINE MICROALBUMIN  08/03/2021    Lab Results  Component Value Date   LABMICR 47.2 08/03/2020   LABMICR 11.5 02/13/2019   - 5 lb weight loss since last visit, increased ozempic to 245mweekly to optimize weight loss - Bayer DCA Hb A1c Waived, 6.9 today, met goal A1C <7. - encouraged healthy diet and exercise - pravastatin (PRAVACHOL) 40 MG tablet; Take 1 tablet (40 mg total) by mouth daily.  Dispense: 90 tablet; Refill: 1 - Continuous Blood Gluc Sensor (FREESTYLE LIBRE 2 SENSOR) MISC; 1 Device by Does not apply route every 14 (fourteen) days.  Dispense: 2 each; Refill: 2 - Semaglutide, 2 MG/DOSE, 8 MG/3ML SOPN; Inject 2 mg as directed once a week.  Dispense: 9 mL; Refill: 1  2. Essential hypertension - Well controlled on current regimen - encouraged healthy diet and exercise - continue amlodipine as prescribed - Lipid panel - CBC with Differential/Platelet - CMP14+EGFR - amLODipine (NORVASC) 10 MG tablet; Take 1 tablet (10 mg total) by mouth daily.  Dispense: 90 tablet; Refill: 1  3. Anxiety, generalized -Well controlled on current regimen - escitalopram (LEXAPRO) 10 MG tablet; TAKE 1 TABLET BY MOUTH EVERYDAY AT BEDTIME  Dispense: 90 tablet; Refill: 1  4. Attention deficit  hyperactivity disorder (ADHD), predominantly inattentive type - Improving - increased Vyvanse dose to 4022m Controlled substance agreement signed today. Urine drug screen collected today. PDMP reviewed with no concerning findings.  - lisdexamfetamine (VYVANSE) 40 MG capsule; Take 1 capsule (40 mg total) by mouth daily.  Dispense: 30 capsule; Refill: 0 - lisdexamfetamine (VYVANSE) 40 MG capsule; Take 1 capsule (40 mg total) by mouth daily.  Dispense: 30 capsule; Refill: 0 - lisdexamfetamine (VYVANSE) 40 MG capsule; Take 1 capsule (40 mg total) by mouth daily.  Dispense: 30 capsule; Refill: 0 - ToxASSURE Select 13 (MW), Urine  5. Dyslipidemia - tolerating pravastatin - encouraged healthy diet and exercise - pravastatin (PRAVACHOL) 40 MG tablet; Take 1 tablet (40 mg total) by mouth daily.  Dispense: 90 tablet; Refill: 1  6. Overweight - Ozempic increased from 1 mg to 2 mg once weekly. Patient has lost 5 lbs in the past 3 months.   Return in about 3 months (around 04/28/2021) for follow-up of chronic medication conditions.  AshLucile CraterP Student  Subjective:    [name]    DOB: [birth year] 31o.   MRN: [id number] Care Team: JoyLoman BrooklynNP as PCP - General (Family Medicine) RouGala RomneybCristopher EstimableD as Consulting Physician (Gastroenterology) Le,Harlen LabsD as Referring Physician (Optometry)   Chief Complaint:  Chief Complaint  Patient presents with   Diabetes   ADD  3 month follow up of chronic medical conditions     HPI: Randall Murphy is a 40 y.o. male presenting on 01/27/2021 for Diabetes and ADD (3 month follow up of chronic medical conditions )  Diabetes: Patient presents for follow up of diabetes. Current symptoms include: hyperglycemia. Known diabetic complications: none. Medication compliance: patient has been using Ozempic once weekly and is tolerating it well. Current diet: in general, an "unhealthy" diet. Current exercise:  none. Home blood sugar records: patient does not check sugars. Is he  on ACE inhibitor or angiotensin II receptor blocker? No. Is he on a statin? Yes. He is interested in the [employer].  Hyperlipidemia:  Was started on pravastatin at last visit, he states he is tolerating it well.  Hypertension:  Patient has been taking amlodipine daily as prescribed. He does not check his BP at home. No exercise.   Anxiety:  Feels he is doing well with Lexapro 10 mg once daily. Does not need a dosage change.  Depression screen [facility] 2/9 01/27/2021 10/29/2020 09/15/2020  Decreased Interest 0 0 0  Down, Depressed, Hopeless 0 0 0  PHQ - 2 Score 0 0 0  Altered sleeping 0 2 0  Tired, decreased energy '1 2 1  ' Change in appetite 0 0 1  Feeling bad or failure about yourself  0 1 1  Trouble concentrating '1 2 2  ' Moving slowly or fidgety/restless 0 0 0  Suicidal thoughts 0 0 0  PHQ-9 Score '2 7 5  ' Difficult doing work/chores Somewhat difficult Somewhat difficult Not difficult at all   GAD 7 : Generalized Anxiety Score 01/27/2021 10/29/2020 09/15/2020 08/03/2020  Nervous, Anxious, on Edge 0 0 1 1  Control/stop worrying 0 0 1 0  Worry too much - different things 0 0 1 1  Trouble relaxing 1 0 0 0  Restless 1 1 0 2  Easily annoyed or irritable 0 1 0 3  Afraid - awful might happen 0 0 0 0  Total GAD 7 Score '2 2 3 7  ' Anxiety Difficulty Somewhat difficult Not difficult at all Not difficult at all Somewhat difficult   ADD: Patient is concerned about his lack of focus. He feels he is all over the place at work. He works in Engineer, technical sales. States he was diagnosed with ADD as a teenager and has taken medication intermittently since then. When he is working a fast paced job he is able to keep up and does not need the medication. 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 He has tried Oncologist and did not feel it was helpful for him. He was started on Vyvanse at last visit, 40m daily. He states it is working  well for him, but efficacy starts to decrease around lunch time for him, so he inquired if the dose could be increased. He reports when he took this in the past he was taking 50 mg.  New complaints: None   Social history:  Relevant past medical, surgical, family and social history reviewed and updated as indicated. Interim medical history since our last visit reviewed.  Allergies and medications reviewed and updated.  DATA REVIEWED: CHART IN EPIC  ROS: Negative unless specifically indicated above in HPI.    Current Outpatient Medications:    Continuous Blood Gluc Sensor (FREESTYLE LIBRE 2 SENSOR) MISC, 1 Device by Does not apply route every 14 (fourteen) days., Disp: 2 each, Rfl: 2   Semaglutide, 2 MG/DOSE, 8 MG/3ML SOPN, Inject 2  mg as directed once a week., Disp: 9 mL, Rfl: 1   amLODipine (NORVASC) 10 MG tablet, Take 1 tablet (10 mg total) by mouth daily., Disp: 90 tablet, Rfl: 1   escitalopram (LEXAPRO) 10 MG tablet, TAKE 1 TABLET BY MOUTH EVERYDAY AT BEDTIME, Disp: 90 tablet, Rfl: 1   [START ON 03/28/2021] lisdexamfetamine (VYVANSE) 40 MG capsule, Take 1 capsule (40 mg total) by mouth daily., Disp: 30 capsule, Rfl: 0   [START ON 02/26/2021] lisdexamfetamine (VYVANSE) 40 MG capsule, Take 1 capsule (40 mg total) by mouth daily., Disp: 30 capsule, Rfl: 0   lisdexamfetamine (VYVANSE) 40 MG capsule, Take 1 capsule (40 mg total) by mouth daily., Disp: 30 capsule, Rfl: 0   pravastatin (PRAVACHOL) 40 MG tablet, Take 1 tablet (40 mg total) by mouth daily., Disp: 90 tablet, Rfl: 1   No Known Allergies Past Medical History:  Diagnosis Date   ADD (attention deficit disorder) 02/22/2015   Anxiety, generalized 02/15/2016   Diabetes mellitus without complication ([place])    Dyslipidemia 02/22/2015   Elevated liver enzymes    Fatty liver 03/23/2015   Hypertension     History reviewed. No pertinent surgical history.  Social History   Socioeconomic History   Marital status: Married    Spouse  name: Not on file   Number of children: 2   Years of education: Not on file   Highest education level: Not on file  Occupational History   Not on file  Tobacco Use   Smoking status: Never   Smokeless tobacco: Never  Vaping Use   Vaping Use: Never used  Substance and Sexual Activity   Alcohol use: Yes    Alcohol/week: 10.0 standard drinks    Types: 10 Cans of beer per week    Comment: daily   Drug use: Never   Sexual activity: Yes    Birth control/protection: Condom  Other Topics Concern   Not on file  Social History Narrative   Not on file   Social Determinants of Health   Financial Resource Strain: Not on file  Food Insecurity: Not on file  Transportation Needs: Not on file  Physical Activity: Not on file  Stress: Not on file  Social Connections: Not on file  Intimate Partner Violence: Not on file        Objective:    BP 134/85   Pulse (!) 107   Temp 98.6 F (37 C) (Temporal)   Ht '5\' 9"'  (1.753 m)   Wt 90.7 kg   SpO2 99%   BMI 29.53 kg/m   Wt Readings from Last 3 Encounters:  01/27/21 200 lb (90.7 kg)  10/29/20 205 lb 3.2 oz (93.1 kg)  09/15/20 206 lb 3.2 oz (93.5 kg)    Physical Exam Vitals reviewed.  Constitutional:      General: He is not in acute distress.    Appearance: Normal appearance. He is overweight. He is not ill-appearing, toxic-appearing or diaphoretic.  HENT:     Head: Normocephalic and atraumatic.  Eyes:     General: No scleral icterus.       Right eye: No discharge.        Left eye: No discharge.     Conjunctiva/sclera: Conjunctivae normal.  Cardiovascular:     Rate and Rhythm: Normal rate and regular rhythm.     Heart sounds: Normal heart sounds. No murmur heard.   No friction rub. No gallop.  Pulmonary:     Effort: Pulmonary effort is normal. No respiratory distress.  Breath sounds: Normal breath sounds. No stridor. No wheezing, rhonchi or rales.  Musculoskeletal:        General: Normal range of motion.     Cervical  back: Normal range of motion.  Skin:    General: Skin is warm and dry.  Neurological:     Mental Status: He is alert and oriented to person, place, and time. Mental status is at baseline.  Psychiatric:        Mood and Affect: Mood normal.        Behavior: Behavior normal.        Thought Content: Thought content normal.        Judgment: Judgment normal.    No results found for: TSH Lab Results  Component Value Date   WBC 6.2 10/29/2020   HGB 14.8 10/29/2020   HCT 44.8 10/29/2020   MCV 91 10/29/2020   PLT 176 10/29/2020   Lab Results  Component Value Date   NA 138 10/29/2020   K 4.0 10/29/2020   CO2 24 10/29/2020   GLUCOSE 236 (H) 10/29/2020   BUN 8 10/29/2020   CREATININE 0.81 10/29/2020   BILITOT 0.7 10/29/2020   ALKPHOS 75 10/29/2020   AST 75 (H) 10/29/2020   ALT 54 (H) 10/29/2020   PROT 7.4 10/29/2020   ALBUMIN 4.8 10/29/2020   CALCIUM 9.7 10/29/2020   ANIONGAP 9 02/06/2018   EGFR 114 10/29/2020   Lab Results  Component Value Date   CHOL 152 10/29/2020   Lab Results  Component Value Date   HDL 47 10/29/2020   Lab Results  Component Value Date   LDLCALC 70 10/29/2020   Lab Results  Component Value Date   TRIG 211 (H) 10/29/2020   Lab Results  Component Value Date   CHOLHDL 3.2 10/29/2020   Lab Results  Component Value Date   HGBA1C 7.5 (H) 10/29/2020

## 2021-01-28 LAB — CMP14+EGFR
ALT: 57 IU/L — ABNORMAL HIGH (ref 0–44)
AST: 69 IU/L — ABNORMAL HIGH (ref 0–40)
Albumin/Globulin Ratio: 1.8 (ref 1.2–2.2)
Albumin: 4.8 g/dL (ref 4.0–5.0)
Alkaline Phosphatase: 80 IU/L (ref 44–121)
BUN/Creatinine Ratio: 7 — ABNORMAL LOW (ref 9–20)
BUN: 7 mg/dL (ref 6–24)
Bilirubin Total: 0.4 mg/dL (ref 0.0–1.2)
CO2: 21 mmol/L (ref 20–29)
Calcium: 9.7 mg/dL (ref 8.7–10.2)
Chloride: 98 mmol/L (ref 96–106)
Creatinine, Ser: 1.01 mg/dL (ref 0.76–1.27)
Globulin, Total: 2.6 g/dL (ref 1.5–4.5)
Glucose: 242 mg/dL — ABNORMAL HIGH (ref 65–99)
Potassium: 4.2 mmol/L (ref 3.5–5.2)
Sodium: 137 mmol/L (ref 134–144)
Total Protein: 7.4 g/dL (ref 6.0–8.5)
eGFR: 96 mL/min/{1.73_m2} (ref >59)

## 2021-01-28 LAB — CBC WITH DIFFERENTIAL/PLATELET
Basophils Absolute: 0.1 10*3/uL (ref 0.0–0.2)
Basos: 1 %
EOS (ABSOLUTE): 0.2 10*3/uL (ref 0.0–0.4)
Eos: 3 %
Hematocrit: 45.2 % (ref 37.5–51.0)
Hemoglobin: 15.5 g/dL (ref 13.0–17.7)
Immature Grans (Abs): 0 10*3/uL (ref 0.0–0.1)
Immature Granulocytes: 0 %
Lymphocytes Absolute: 2.1 10*3/uL (ref 0.7–3.1)
Lymphs: 35 %
MCH: 29.5 pg (ref 26.6–33.0)
MCHC: 34.3 g/dL (ref 31.5–35.7)
MCV: 86 fL (ref 79–97)
Monocytes Absolute: 0.5 10*3/uL (ref 0.1–0.9)
Monocytes: 8 %
Neutrophils Absolute: 3.2 10*3/uL (ref 1.4–7.0)
Neutrophils: 53 %
Platelets: 229 10*3/uL (ref 150–450)
RBC: 5.25 x10E6/uL (ref 4.14–5.80)
RDW: 11.9 % (ref 11.6–15.4)
WBC: 6 10*3/uL (ref 3.4–10.8)

## 2021-01-28 LAB — LIPID PANEL
Chol/HDL Ratio: 3 ratio (ref 0.0–5.0)
Cholesterol, Total: 141 mg/dL (ref 100–199)
HDL: 47 mg/dL (ref >39)
LDL Chol Calc (NIH): 61 mg/dL (ref 0–99)
Triglycerides: 200 mg/dL — ABNORMAL HIGH (ref 0–149)
VLDL Cholesterol Cal: 33 mg/dL (ref 5–40)

## 2021-02-02 LAB — TOXASSURE SELECT 13 (MW), URINE

## 2021-03-16 ENCOUNTER — Encounter: Payer: Self-pay | Admitting: Family Medicine

## 2021-04-25 ENCOUNTER — Encounter: Payer: Self-pay | Admitting: Family Medicine

## 2021-04-29 ENCOUNTER — Encounter: Payer: Self-pay | Admitting: Family Medicine

## 2021-04-29 ENCOUNTER — Ambulatory Visit: Payer: BC Managed Care – PPO | Admitting: Family Medicine

## 2021-04-29 VITALS — BP 133/90 | HR 88 | Temp 97.8°F | Ht 69.0 in | Wt 194.2 lb

## 2021-04-29 DIAGNOSIS — I10 Essential (primary) hypertension: Secondary | ICD-10-CM

## 2021-04-29 DIAGNOSIS — E785 Hyperlipidemia, unspecified: Secondary | ICD-10-CM | POA: Diagnosis not present

## 2021-04-29 DIAGNOSIS — F411 Generalized anxiety disorder: Secondary | ICD-10-CM | POA: Diagnosis not present

## 2021-04-29 DIAGNOSIS — E663 Overweight: Secondary | ICD-10-CM

## 2021-04-29 DIAGNOSIS — F900 Attention-deficit hyperactivity disorder, predominantly inattentive type: Secondary | ICD-10-CM

## 2021-04-29 DIAGNOSIS — E1169 Type 2 diabetes mellitus with other specified complication: Secondary | ICD-10-CM | POA: Diagnosis not present

## 2021-04-29 LAB — BAYER DCA HB A1C WAIVED: HB A1C (BAYER DCA - WAIVED): 5.3 % (ref 4.8–5.6)

## 2021-04-29 MED ORDER — LISDEXAMFETAMINE DIMESYLATE 40 MG PO CAPS: 40.0000 mg | ORAL_CAPSULE | Freq: Every day | ORAL | 0 refills | Status: DC

## 2021-04-29 NOTE — Progress Notes (Signed)
Assessment & Plan:  1. DM type 2 with diabetic dyslipidemia ([name]) Lab Results  Component Value Date   HGBA1C 5.3 04/29/2021   HGBA1C 6.9 01/27/2021   HGBA1C 7.5 (H) 10/29/2020    - Diabetes is at goal of A1c < 7. - Medications: continue current medications - Home glucose monitoring: Continue using freestyle libre.  Encouraged more consistent use.  Per his current report he has only been active 15% of the time in the past 2 weeks.  He is in target range 60% of the time with 0% low or very low, 31% high, and 9% very high. - Patient is currently taking a statin. Patient is not taking an ACE-inhibitor/ARB.  - Instruction/counseling given: reminded to get eye exam and reminded to bring medications to each visit  Diabetes Health Maintenance Due  Topic Date Due   OPHTHALMOLOGY EXAM  Never done   HEMOGLOBIN A1C  07/28/2021   FOOT EXAM  08/03/2021   URINE MICROALBUMIN  08/03/2021    Lab Results  Component Value Date   LABMICR 47.2 08/03/2020   LABMICR 11.5 02/13/2019   - Lipid panel - CBC with Differential/Platelet - CMP14+EGFR - Bayer DCA Hb A1c Waived  2. Essential hypertension Well controlled on current regimen.  - Lipid panel - CBC with Differential/Platelet - CMP14+EGFR  3. Dyslipidemia Labs to assess. - Lipid panel - CMP14+EGFR  4. Anxiety, generalized Well controlled on current regimen.  - CMP14+EGFR  5. Attention deficit hyperactivity disorder (ADHD), predominantly inattentive type Well controlled on current regimen. Controlled substance agreement in place. Urine drug screen as expected. PDMP reviewed with no concerning findings.  - CMP14+EGFR - lisdexamfetamine (VYVANSE) 40 MG capsule; Take 1 capsule (40 mg total) by mouth daily.  Dispense: 30 capsule; Refill: 0 - lisdexamfetamine (VYVANSE) 40 MG capsule; Take 1 capsule (40 mg total) by mouth daily.  Dispense: 30 capsule; Refill: 0 - lisdexamfetamine (VYVANSE) 40 MG capsule; Take 1 capsule (40 mg total) by mouth  daily.  Dispense: 30 capsule; Refill: 0  6. Overweight (BMI 25.0-29.9) Encouraged to continue making healthy lifestyle changes, exercise, and decrease his alcohol consumption.   Return in about 3 months (around 07/28/2021) for annual physical.  Randall Limes, MSN, APRN, FNP-C [facility] 04/29/21  Subjective:    [name], male    DOB: Mar 19, 1981, 40 y.o.   MRN: [id number]  Patient Care Team: Loman Brooklyn, FNP as PCP - General (Family Medicine) Gala Romney Cristopher Estimable, MD as Consulting Physician (Gastroenterology) Harlen Labs, MD as Referring Physician (Optometry)   Chief Complaint:  Chief Complaint  Patient presents with   Diabetes   Hypertension    3 month follow up of chronic medical conditions     HPI: ANGELOS Murphy is a 40 y.o. male presenting on 04/29/2021 for Diabetes and Hypertension (3 month follow up of chronic medical conditions )  Diabetes: Patient presents for follow up of diabetes. Current symptoms include: hyperglycemia. Known diabetic complications: none. Medication compliance: patient has been using Ozempic once weekly and is tolerating it well. Current diet: in general, an "unhealthy" diet, however he has decreased the amount of bread he eats. Current exercise: none. Home blood sugar records: Patient has a freestyle [place], but admits he has not been wearing it for the past week.  Patient does not check sugars. Is he  on ACE inhibitor or angiotensin II receptor blocker? No. Is he on a statin? Yes (pravastatin).   Hyperlipidemia: tolerating pravastatin.  Hypertension: Patient has been taking amlodipine daily as prescribed. He does not check his BP at home. No exercise.   Anxiety: Feels he is doing well with Lexapro 10 mg once daily. Does not need a dosage change.  Depression screen [facility] 2/9 04/29/2021 01/27/2021 10/29/2020  Decreased Interest 2 0 0  Down, Depressed, Hopeless 0 0 0  PHQ - 2 Score 2 0 0  Altered sleeping 1 0 2   Tired, decreased energy '1 1 2  ' Change in appetite 0 0 0  Feeling bad or failure about yourself  0 0 1  Trouble concentrating '2 1 2  ' Moving slowly or fidgety/restless 0 0 0  Suicidal thoughts 0 0 0  PHQ-9 Score '6 2 7  ' Difficult doing work/chores Not difficult at all Somewhat difficult Somewhat difficult   GAD 7 : Generalized Anxiety Score 04/29/2021 01/27/2021 10/29/2020 09/15/2020  Nervous, Anxious, on Edge 0 0 0 1  Control/stop worrying 1 0 0 1  Worry too much - different things 1 0 0 1  Trouble relaxing 1 1 0 0  Restless '1 1 1 ' 0  Easily annoyed or irritable 1 0 1 0  Afraid - awful might happen 0 0 0 0  Total GAD 7 Score '5 2 2 3  ' Anxiety Difficulty Not difficult at all Somewhat difficult Not difficult at all Not difficult at all   ADD: Patient was diagnosed with ADD as a teenager and has taken medication intermittently since then.  In the past when working at a fast-paced job he was able to keep up and did not need the medication.  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 He has tried Oncologist and did not feel it was helpful for him. He was then transition to Vyvanse 30 mg daily, which worked well until lunchtime.  At our last visit he was increased to 40 mg daily, which he reports is currently working very well for him.  New complaints: None    Social history:  Relevant past medical, surgical, family and social history reviewed and updated as indicated. Interim medical history since our last visit reviewed.  Allergies and medications reviewed and updated.  DATA REVIEWED: CHART IN EPIC  ROS: Negative unless specifically indicated above in HPI.    Current Outpatient Medications:    amLODipine (NORVASC) 10 MG tablet, Take 1 tablet (10 mg total) by mouth daily., Disp: 90 tablet, Rfl: 1   Continuous Blood Gluc Sensor (FREESTYLE LIBRE 2 SENSOR) MISC, 1 Device by Does not apply route every 14 (fourteen) days., Disp: 2 each,  Rfl: 2   escitalopram (LEXAPRO) 10 MG tablet, TAKE 1 TABLET BY MOUTH EVERYDAY AT BEDTIME, Disp: 90 tablet, Rfl: 1   lisdexamfetamine (VYVANSE) 40 MG capsule, Take 1 capsule (40 mg total) by mouth daily., Disp: 30 capsule, Rfl: 0   lisdexamfetamine (VYVANSE) 40 MG capsule, Take 1 capsule (40 mg total) by mouth daily., Disp: 30 capsule, Rfl: 0   lisdexamfetamine (VYVANSE) 40 MG capsule, Take 1 capsule (40 mg total) by mouth daily., Disp: 30 capsule, Rfl: 0   pravastatin (PRAVACHOL) 40 MG tablet, Take 1 tablet (40 mg total) by mouth daily., Disp: 90 tablet, Rfl: 1   Semaglutide, 2 MG/DOSE, 8 MG/3ML SOPN, Inject 2 mg as directed once a week., Disp: 9 mL, Rfl: 1   No Known Allergies Past Medical History:  Diagnosis Date   ADD (attention deficit disorder) 02/22/2015   Anxiety, generalized 02/15/2016  Diabetes mellitus without complication ([place])    Dyslipidemia 02/22/2015   Elevated liver enzymes    Fatty liver 03/23/2015   Hypertension     History reviewed. No pertinent surgical history.  Social History   Socioeconomic History   Marital status: Married    Spouse name: Not on file   Number of children: 2   Years of education: Not on file   Highest education level: Not on file  Occupational History   Not on file  Tobacco Use   Smoking status: Never   Smokeless tobacco: Never  Vaping Use   Vaping Use: Never used  Substance and Sexual Activity   Alcohol use: Yes    Alcohol/week: 10.0 standard drinks    Types: 10 Cans of beer per week    Comment: daily   Drug use: Never   Sexual activity: Yes    Birth control/protection: Condom  Other Topics Concern   Not on file  Social History Narrative   Not on file   Social Determinants of Health   Financial Resource Strain: Not on file  Food Insecurity: Not on file  Transportation Needs: Not on file  Physical Activity: Not on file  Stress: Not on file  Social Connections: Not on file  Intimate Partner Violence: Not on file         Objective:    BP 133/90   Pulse 88   Temp 97.8 F (36.6 C)   Ht '5\' 9"'  (1.753 m)   Wt 194 lb 3.2 oz (88.1 kg)   SpO2 97%   BMI 28.68 kg/m   Wt Readings from Last 3 Encounters:  04/29/21 194 lb 3.2 oz (88.1 kg)  01/27/21 200 lb (90.7 kg)  10/29/20 205 lb 3.2 oz (93.1 kg)    Physical Exam Vitals reviewed.  Constitutional:      General: He is not in acute distress.    Appearance: Normal appearance. He is overweight. He is not ill-appearing, toxic-appearing or diaphoretic.  HENT:     Head: Normocephalic and atraumatic.  Eyes:     General: No scleral icterus.       Right eye: No discharge.        Left eye: No discharge.     Conjunctiva/sclera: Conjunctivae normal.  Cardiovascular:     Rate and Rhythm: Normal rate and regular rhythm.     Heart sounds: Normal heart sounds. No murmur heard.   No friction rub. No gallop.  Pulmonary:     Effort: Pulmonary effort is normal. No respiratory distress.     Breath sounds: Normal breath sounds. No stridor. No wheezing, rhonchi or rales.  Musculoskeletal:        General: Normal range of motion.     Cervical back: Normal range of motion.  Skin:    General: Skin is warm and dry.  Neurological:     Mental Status: He is alert and oriented to person, place, and time. Mental status is at baseline.  Psychiatric:        Mood and Affect: Mood normal.        Behavior: Behavior normal.        Thought Content: Thought content normal.        Judgment: Judgment normal.    No results found for: TSH Lab Results  Component Value Date   WBC 6.0 01/27/2021   HGB 15.5 01/27/2021   HCT 45.2 01/27/2021   MCV 86 01/27/2021   PLT 229 01/27/2021   Lab Results  Component Value  Date   NA 137 01/27/2021   K 4.2 01/27/2021   CO2 21 01/27/2021   GLUCOSE 242 (H) 01/27/2021   BUN 7 01/27/2021   CREATININE 1.01 01/27/2021   BILITOT 0.4 01/27/2021   ALKPHOS 80 01/27/2021   AST 69 (H) 01/27/2021   ALT 57 (H) 01/27/2021   PROT 7.4 01/27/2021    ALBUMIN 4.8 01/27/2021   CALCIUM 9.7 01/27/2021   ANIONGAP 9 02/06/2018   EGFR 96 01/27/2021   Lab Results  Component Value Date   CHOL 141 01/27/2021   Lab Results  Component Value Date   HDL 47 01/27/2021   Lab Results  Component Value Date   LDLCALC 61 01/27/2021   Lab Results  Component Value Date   TRIG 200 (H) 01/27/2021   Lab Results  Component Value Date   CHOLHDL 3.0 01/27/2021   Lab Results  Component Value Date   HGBA1C 6.9 01/27/2021

## 2021-04-29 NOTE — Patient Instructions (Signed)
Please schedule your diabetic eye exam.

## 2021-04-30 LAB — CMP14+EGFR
ALT: 37 IU/L (ref 0–44)
AST: 40 IU/L (ref 0–40)
Albumin/Globulin Ratio: 1.9 (ref 1.2–2.2)
Albumin: 4.7 g/dL (ref 4.0–5.0)
Alkaline Phosphatase: 71 IU/L (ref 44–121)
BUN/Creatinine Ratio: 7 — ABNORMAL LOW (ref 9–20)
BUN: 7 mg/dL (ref 6–24)
Bilirubin Total: 0.7 mg/dL (ref 0.0–1.2)
CO2: 23 mmol/L (ref 20–29)
Calcium: 9.8 mg/dL (ref 8.7–10.2)
Chloride: 100 mmol/L (ref 96–106)
Creatinine, Ser: 0.98 mg/dL (ref 0.76–1.27)
Globulin, Total: 2.5 g/dL (ref 1.5–4.5)
Glucose: 162 mg/dL — ABNORMAL HIGH (ref 70–99)
Potassium: 4.5 mmol/L (ref 3.5–5.2)
Sodium: 139 mmol/L (ref 134–144)
Total Protein: 7.2 g/dL (ref 6.0–8.5)
eGFR: 99 mL/min/{1.73_m2} (ref >59)

## 2021-04-30 LAB — CBC WITH DIFFERENTIAL/PLATELET
Basophils Absolute: 0.1 10*3/uL (ref 0.0–0.2)
Basos: 1 %
EOS (ABSOLUTE): 0.3 10*3/uL (ref 0.0–0.4)
Eos: 4 %
Hematocrit: 47.1 % (ref 37.5–51.0)
Hemoglobin: 15.8 g/dL (ref 13.0–17.7)
Immature Grans (Abs): 0 10*3/uL (ref 0.0–0.1)
Immature Granulocytes: 0 %
Lymphocytes Absolute: 2.3 10*3/uL (ref 0.7–3.1)
Lymphs: 34 %
MCH: 29.5 pg (ref 26.6–33.0)
MCHC: 33.5 g/dL (ref 31.5–35.7)
MCV: 88 fL (ref 79–97)
Monocytes Absolute: 0.5 10*3/uL (ref 0.1–0.9)
Monocytes: 7 %
Neutrophils Absolute: 3.6 10*3/uL (ref 1.4–7.0)
Neutrophils: 54 %
Platelets: 252 10*3/uL (ref 150–450)
RBC: 5.36 x10E6/uL (ref 4.14–5.80)
RDW: 12 % (ref 11.6–15.4)
WBC: 6.8 10*3/uL (ref 3.4–10.8)

## 2021-04-30 LAB — LIPID PANEL
Chol/HDL Ratio: 3.4 ratio (ref 0.0–5.0)
Cholesterol, Total: 150 mg/dL (ref 100–199)
HDL: 44 mg/dL (ref >39)
LDL Chol Calc (NIH): 80 mg/dL (ref 0–99)
Triglycerides: 150 mg/dL — ABNORMAL HIGH (ref 0–149)
VLDL Cholesterol Cal: 26 mg/dL (ref 5–40)

## 2021-05-02 ENCOUNTER — Encounter: Payer: Self-pay | Admitting: Family Medicine

## 2021-07-26 LAB — HM DIABETES EYE EXAM

## 2021-07-27 ENCOUNTER — Encounter: Payer: Self-pay | Admitting: Family Medicine

## 2021-07-27 DIAGNOSIS — E1169 Type 2 diabetes mellitus with other specified complication: Secondary | ICD-10-CM

## 2021-07-27 MED ORDER — FREESTYLE LIBRE 3 SENSOR MISC: 1.0000 | 2 refills | Status: DC

## 2021-07-29 ENCOUNTER — Ambulatory Visit (INDEPENDENT_AMBULATORY_CARE_PROVIDER_SITE_OTHER): Payer: BC Managed Care – PPO | Admitting: Family Medicine

## 2021-07-29 ENCOUNTER — Encounter: Payer: Self-pay | Admitting: Family Medicine

## 2021-07-29 VITALS — BP 123/78 | HR 87 | Temp 97.8°F | Ht 69.0 in | Wt 199.2 lb

## 2021-07-29 DIAGNOSIS — F411 Generalized anxiety disorder: Secondary | ICD-10-CM

## 2021-07-29 DIAGNOSIS — E1165 Type 2 diabetes mellitus with hyperglycemia: Secondary | ICD-10-CM

## 2021-07-29 DIAGNOSIS — E1169 Type 2 diabetes mellitus with other specified complication: Secondary | ICD-10-CM

## 2021-07-29 DIAGNOSIS — F900 Attention-deficit hyperactivity disorder, predominantly inattentive type: Secondary | ICD-10-CM

## 2021-07-29 DIAGNOSIS — Z0001 Encounter for general adult medical examination with abnormal findings: Secondary | ICD-10-CM

## 2021-07-29 DIAGNOSIS — E785 Hyperlipidemia, unspecified: Secondary | ICD-10-CM | POA: Diagnosis not present

## 2021-07-29 DIAGNOSIS — I10 Essential (primary) hypertension: Secondary | ICD-10-CM

## 2021-07-29 DIAGNOSIS — Z79899 Other long term (current) drug therapy: Secondary | ICD-10-CM

## 2021-07-29 DIAGNOSIS — Z0000 Encounter for general adult medical examination without abnormal findings: Secondary | ICD-10-CM

## 2021-07-29 LAB — BAYER DCA HB A1C WAIVED: HB A1C (BAYER DCA - WAIVED): 6.3 % — ABNORMAL HIGH (ref 4.8–5.6)

## 2021-07-29 MED ORDER — AMLODIPINE BESYLATE 10 MG PO TABS: 10.0000 mg | ORAL_TABLET | Freq: Every day | ORAL | 1 refills | Status: DC

## 2021-07-29 MED ORDER — LISDEXAMFETAMINE DIMESYLATE 40 MG PO CAPS: 40.0000 mg | ORAL_CAPSULE | Freq: Every day | ORAL | 0 refills | Status: DC

## 2021-07-29 MED ORDER — PRAVASTATIN SODIUM 40 MG PO TABS: 40.0000 mg | ORAL_TABLET | Freq: Every day | ORAL | 1 refills | Status: DC

## 2021-07-29 MED ORDER — ESCITALOPRAM OXALATE 10 MG PO TABS: 10.0000 mg | ORAL_TABLET | Freq: Every day | ORAL | 1 refills | Status: DC

## 2021-07-29 MED ORDER — SEMAGLUTIDE (2 MG/DOSE) 8 MG/3ML ~~LOC~~ SOPN: 2.0000 mg | PEN_INJECTOR | SUBCUTANEOUS | 1 refills | Status: DC

## 2021-07-29 NOTE — Progress Notes (Signed)
Assessment & Plan:  1. Well adult exam Preventive health education provided. - Lipid panel - CBC with Differential/Platelet - CMP14+EGFR  2. Controlled type 2 diabetes mellitus with hyperglycemia, without long-term current use of insulin ([facility]) Lab Results  Component Value Date   HGBA1C 6.3 (H) 07/29/2021   HGBA1C 5.3 04/29/2021   HGBA1C 6.9 01/27/2021    - Diabetes is at goal of A1c < 7. - Medications: continue current medications - Home glucose monitoring: continue wearing freestyle libre 3 - Patient is currently taking a statin. Patient is not taking an ACE-inhibitor/ARB.  - Instruction/counseling given: reminded to get eye exam  Diabetes Health Maintenance Due  Topic Date Due   OPHTHALMOLOGY EXAM  Never done   FOOT EXAM  08/03/2021   HEMOGLOBIN A1C  10/29/2021   URINE MICROALBUMIN  07/30/2022    Lab Results  Component Value Date   LABMICR <3.0 07/29/2021   LABMICR 47.2 08/03/2020   - Lipid panel - CBC with Differential/Platelet - CMP14+EGFR - Bayer DCA Hb A1c Waived - Microalbumin / creatinine urine ratio - Semaglutide, 2 MG/DOSE, 8 MG/3ML SOPN; Inject 2 mg as directed once a week.  Dispense: 9 mL; Refill: 1 - pravastatin (PRAVACHOL) 40 MG tablet; Take 1 tablet (40 mg total) by mouth daily.  Dispense: 90 tablet; Refill: 1  3. Essential hypertension Well controlled on current regimen.  - Lipid panel - CBC with Differential/Platelet - CMP14+EGFR - amLODipine (NORVASC) 10 MG tablet; Take 1 tablet (10 mg total) by mouth daily.  Dispense: 90 tablet; Refill: 1  4-5. Attention deficit hyperactivity disorder (ADHD), predominantly inattentive type/Controlled substance agreement signed Well controlled on current regimen. Controlled substance agreement in place. Urine drug screen as expected. PDMP reviewed with no concerning findings.  - CMP14+EGFR - lisdexamfetamine (VYVANSE) 40 MG capsule; Take 1 capsule (40 mg total) by mouth daily.  Dispense: 30 capsule; Refill: 0 -  lisdexamfetamine (VYVANSE) 40 MG capsule; Take 1 capsule (40 mg total) by mouth daily.  Dispense: 30 capsule; Refill: 0 - lisdexamfetamine (VYVANSE) 40 MG capsule; Take 1 capsule (40 mg total) by mouth daily.  Dispense: 30 capsule; Refill: 0  6. Anxiety, generalized Well controlled on current regimen.  - CMP14+EGFR - escitalopram (LEXAPRO) 10 MG tablet; Take 1 tablet (10 mg total) by mouth daily.  Dispense: 90 tablet; Refill: 1  7. Dyslipidemia Well controlled on current regimen.  - Lipid panel - CMP14+EGFR - pravastatin (PRAVACHOL) 40 MG tablet; Take 1 tablet (40 mg total) by mouth daily.  Dispense: 90 tablet; Refill: 1   Follow-up: Return in about 3 months (around 10/29/2021) for ADHD.   Hendricks Limes, MSN, APRN, FNP-C [facility]  Subjective:  [name], male    DOB: 1980-10-15  Age: 41 y.o. MRN: [id number]  Patient Care Team: Loman Brooklyn, FNP as PCP - General (Family Medicine) Gala Romney Cristopher Estimable, MD as Consulting Physician (Gastroenterology) Harlen Labs, MD as Referring Physician (Optometry)   CC:  Chief Complaint  Patient presents with   Annual Exam    HPI Randall Murphy presents for his annual physical.  Occupation: IT, Marital status: married, Substance use: none Diet: regular, Exercise: none Last eye exam: never Last dental exam: couple months ago Hepatitis C Screening: negative on 02/07/2018 Immunizations: Flu Vaccine: declined Tdap Vaccine: up to date  COVID-19 Vaccine: declined Pneumonia Vaccine: declined  Depression/Anxiety: doing well with Lexapro 10 mg daily and does not desire a dose increase.  Depression screen PHQ  2/9 07/29/2021 04/29/2021 01/27/2021  Decreased Interest 1 2 0  Down, Depressed, Hopeless 1 0 0  PHQ - 2 Score 2 2 0  Altered sleeping 1 1 0  Tired, decreased energy '1 1 1  ' Change in appetite 1 0 0  Feeling bad or failure about yourself  1 0 0  Trouble concentrating '1 2 1  ' Moving slowly or  fidgety/restless 1 0 0  Suicidal thoughts 0 0 0  PHQ-9 Score '8 6 2  ' Difficult doing work/chores Not difficult at all Not difficult at all Somewhat difficult  Some recent data might be hidden   GAD 7 : Generalized Anxiety Score 07/29/2021 04/29/2021 01/27/2021 10/29/2020  Nervous, Anxious, on Edge 1 0 0 0  Control/stop worrying 1 1 0 0  Worry too much - different things 1 1 0 0  Trouble relaxing '1 1 1 ' 0  Restless '1 1 1 1  ' Easily annoyed or irritable 1 1 0 1  Afraid - awful might happen 0 0 0 0  Total GAD 7 Score '6 5 2 2  ' Anxiety Difficulty Not difficult at all Not difficult at all Somewhat difficult Not difficult at all    Diabetes: Patient presents for follow up of diabetes. Current symptoms include: hyperglycemia. Known diabetic complications: none. Medication compliance: using Ozempic once weekly. Current diet: in general, an "unhealthy" diet. Current exercise: none. Home blood sugar records:  wearing the freestyle libre; just recently switched from the 2 to the 3 . Is he  on ACE inhibitor or angiotensin II receptor blocker? No. Is he on a statin? Yes (Pravastatin).   Hyperlipidemia: tolerating pravastatin.   Hypertension: Patient has been taking amlodipine daily as prescribed. He does not check his BP at home. No exercise.   ADD: Patient was diagnosed with ADD as a teenager and has taken medication intermittently since then.  In the past when working at a fast-paced job he was able to keep up and did not need the medication.  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 He has tried Oncologist and did not feel it was helpful for him. He was then transition to Vyvanse 30 mg daily, which worked well until lunchtime.  He reports he is doing well with the increased dose of 40 mg daily.    Review of Systems  Constitutional:  Negative for chills, fever, malaise/fatigue and weight loss.  HENT:  Negative for congestion, ear discharge, ear pain,  nosebleeds, sinus pain, sore throat and tinnitus.   Eyes:  Negative for blurred vision, double vision, pain, discharge and redness.  Respiratory:  Negative for cough, shortness of breath and wheezing.   Cardiovascular:  Negative for chest pain, palpitations and leg swelling.  Gastrointestinal:  Negative for abdominal pain, constipation, diarrhea, heartburn, nausea and vomiting.  Genitourinary:  Negative for dysuria, frequency and urgency.  Musculoskeletal:  Negative for myalgias.  Skin:  Negative for rash.  Neurological:  Negative for dizziness, seizures, weakness and headaches.  Psychiatric/Behavioral:  Negative for depression, substance abuse and suicidal ideas. The patient is not nervous/anxious.     Current Outpatient Medications:    amLODipine (NORVASC) 10 MG tablet, Take 1 tablet (10 mg total) by mouth daily., Disp: 90 tablet, Rfl: 1   Continuous Blood Gluc Sensor (FREESTYLE LIBRE 3 SENSOR) MISC, 1 Device by Does not apply route every 14 (fourteen) days. Place 1 sensor on the skin every 14 days. Use to check glucose continuously, Disp: 2 each,  Rfl: 2   escitalopram (LEXAPRO) 10 MG tablet, TAKE 1 TABLET BY MOUTH EVERYDAY AT BEDTIME, Disp: 90 tablet, Rfl: 1   lisdexamfetamine (VYVANSE) 40 MG capsule, Take 1 capsule (40 mg total) by mouth daily., Disp: 30 capsule, Rfl: 0   lisdexamfetamine (VYVANSE) 40 MG capsule, Take 1 capsule (40 mg total) by mouth daily., Disp: 30 capsule, Rfl: 0   lisdexamfetamine (VYVANSE) 40 MG capsule, Take 1 capsule (40 mg total) by mouth daily., Disp: 30 capsule, Rfl: 0   pravastatin (PRAVACHOL) 40 MG tablet, Take 1 tablet (40 mg total) by mouth daily., Disp: 90 tablet, Rfl: 1   Semaglutide, 2 MG/DOSE, 8 MG/3ML SOPN, Inject 2 mg as directed once a week., Disp: 9 mL, Rfl: 1  No Known Allergies  Past Medical History:  Diagnosis Date   ADD (attention deficit disorder) 02/22/2015   Anxiety, generalized 02/15/2016   Diabetes mellitus without complication ([place])     Dyslipidemia 02/22/2015   Elevated liver enzymes    Fatty liver 03/23/2015   Hypertension     History reviewed. No pertinent surgical history.  Family History  Problem Relation Age of Onset   Arthritis Mother    Diabetes Father    Hypertension Father    Stroke Father    Kidney disease Father    Diabetes Sister    Hypertension Brother    Diabetes Maternal Grandmother    Brain cancer Maternal Grandfather    Heart attack Paternal Grandfather     Social History   Socioeconomic History   Marital status: Married    Spouse name: Not on file   Number of children: 2   Years of education: Not on file   Highest education level: Not on file  Occupational History   Not on file  Tobacco Use   Smoking status: Never   Smokeless tobacco: Never  Vaping Use   Vaping Use: Never used  Substance and Sexual Activity   Alcohol use: Yes    Alcohol/week: 10.0 standard drinks    Types: 10 Cans of beer per week    Comment: daily   Drug use: Never   Sexual activity: Yes    Birth control/protection: Condom  Other Topics Concern   Not on file  Social History Narrative   Not on file   Social Determinants of Health   Financial Resource Strain: Not on file  Food Insecurity: Not on file  Transportation Needs: Not on file  Physical Activity: Not on file  Stress: Not on file  Social Connections: Not on file  Intimate Partner Violence: Not on file      Objective:    BP 123/78    Pulse 87    Temp 97.8 F (36.6 C) (Temporal)    Ht '5\' 9"'  (1.753 m)    Wt 199 lb 3.2 oz (90.4 kg)    SpO2 99%    BMI 29.42 kg/m   Wt Readings from Last 3 Encounters:  07/29/21 199 lb 3.2 oz (90.4 kg)  04/29/21 194 lb 3.2 oz (88.1 kg)  01/27/21 200 lb (90.7 kg)    Physical Exam Vitals reviewed.  Constitutional:      General: He is not in acute distress.    Appearance: Normal appearance. He is overweight. He is not ill-appearing, toxic-appearing or diaphoretic.  HENT:     Head: Normocephalic and  atraumatic.     Right Ear: Tympanic membrane, ear canal and external ear normal. There is no impacted cerumen.     Left Ear: Tympanic  membrane, ear canal and external ear normal. There is no impacted cerumen.     Nose: Nose normal. No congestion or rhinorrhea.     Mouth/Throat:     Mouth: Mucous membranes are moist.     Pharynx: Oropharynx is clear. No oropharyngeal exudate or posterior oropharyngeal erythema.  Eyes:     General: No scleral icterus.       Right eye: No discharge.        Left eye: No discharge.     Conjunctiva/sclera: Conjunctivae normal.     Pupils: Pupils are equal, round, and reactive to light.  Neck:     Vascular: No carotid bruit.  Cardiovascular:     Rate and Rhythm: Normal rate and regular rhythm.     Heart sounds: Normal heart sounds. No murmur heard.   No friction rub. No gallop.  Pulmonary:     Effort: Pulmonary effort is normal. No respiratory distress.     Breath sounds: Normal breath sounds. No stridor. No wheezing, rhonchi or rales.  Abdominal:     General: Abdomen is flat. Bowel sounds are normal. There is no distension.     Palpations: Abdomen is soft. There is no hepatomegaly, splenomegaly or mass.     Tenderness: There is no abdominal tenderness. There is no guarding or rebound.     Hernia: No hernia is present.  Musculoskeletal:        General: Normal range of motion.     Cervical back: Normal range of motion and neck supple. No rigidity. No muscular tenderness.     Right lower leg: No edema.     Left lower leg: No edema.  Lymphadenopathy:     Cervical: No cervical adenopathy.  Skin:    General: Skin is warm and dry.     Capillary Refill: Capillary refill takes less than 2 seconds.  Neurological:     General: No focal deficit present.     Mental Status: He is alert and oriented to person, place, and time. Mental status is at baseline.  Psychiatric:        Mood and Affect: Mood normal.        Behavior: Behavior normal.        Thought  Content: Thought content normal.        Judgment: Judgment normal.    No results found for: TSH Lab Results  Component Value Date   WBC 6.8 04/29/2021   HGB 15.8 04/29/2021   HCT 47.1 04/29/2021   MCV 88 04/29/2021   PLT 252 04/29/2021   Lab Results  Component Value Date   NA 139 04/29/2021   K 4.5 04/29/2021   CO2 23 04/29/2021   GLUCOSE 162 (H) 04/29/2021   BUN 7 04/29/2021   CREATININE 0.98 04/29/2021   BILITOT 0.7 04/29/2021   ALKPHOS 71 04/29/2021   AST 40 04/29/2021   ALT 37 04/29/2021   PROT 7.2 04/29/2021   ALBUMIN 4.7 04/29/2021   CALCIUM 9.8 04/29/2021   ANIONGAP 9 02/06/2018   EGFR 99 04/29/2021   Lab Results  Component Value Date   CHOL 150 04/29/2021   Lab Results  Component Value Date   HDL 44 04/29/2021   Lab Results  Component Value Date   LDLCALC 80 04/29/2021   Lab Results  Component Value Date   TRIG 150 (H) 04/29/2021   Lab Results  Component Value Date   CHOLHDL 3.4 04/29/2021   Lab Results  Component Value Date   HGBA1C 5.3 04/29/2021

## 2021-07-30 LAB — CMP14+EGFR
ALT: 38 IU/L (ref 0–44)
AST: 42 IU/L — ABNORMAL HIGH (ref 0–40)
Albumin/Globulin Ratio: 1.8 (ref 1.2–2.2)
Albumin: 4.6 g/dL (ref 4.0–5.0)
Alkaline Phosphatase: 72 IU/L (ref 44–121)
BUN/Creatinine Ratio: 9 (ref 9–20)
BUN: 8 mg/dL (ref 6–24)
Bilirubin Total: 0.6 mg/dL (ref 0.0–1.2)
CO2: 22 mmol/L (ref 20–29)
Calcium: 9.5 mg/dL (ref 8.7–10.2)
Chloride: 95 mmol/L — ABNORMAL LOW (ref 96–106)
Creatinine, Ser: 0.9 mg/dL (ref 0.76–1.27)
Globulin, Total: 2.5 g/dL (ref 1.5–4.5)
Glucose: 193 mg/dL — ABNORMAL HIGH (ref 70–99)
Potassium: 3.3 mmol/L — ABNORMAL LOW (ref 3.5–5.2)
Sodium: 134 mmol/L (ref 134–144)
Total Protein: 7.1 g/dL (ref 6.0–8.5)
eGFR: 109 mL/min/{1.73_m2} (ref >59)

## 2021-07-30 LAB — CBC WITH DIFFERENTIAL/PLATELET
Basophils Absolute: 0.1 10*3/uL (ref 0.0–0.2)
Basos: 1 %
EOS (ABSOLUTE): 0.2 10*3/uL (ref 0.0–0.4)
Eos: 2 %
Hematocrit: 43.7 % (ref 37.5–51.0)
Hemoglobin: 14.8 g/dL (ref 13.0–17.7)
Immature Grans (Abs): 0 10*3/uL (ref 0.0–0.1)
Immature Granulocytes: 0 %
Lymphocytes Absolute: 2.4 10*3/uL (ref 0.7–3.1)
Lymphs: 32 %
MCH: 29.1 pg (ref 26.6–33.0)
MCHC: 33.9 g/dL (ref 31.5–35.7)
MCV: 86 fL (ref 79–97)
Monocytes Absolute: 0.6 10*3/uL (ref 0.1–0.9)
Monocytes: 8 %
Neutrophils Absolute: 4.2 10*3/uL (ref 1.4–7.0)
Neutrophils: 57 %
Platelets: 245 10*3/uL (ref 150–450)
RBC: 5.08 x10E6/uL (ref 4.14–5.80)
RDW: 12.1 % (ref 11.6–15.4)
WBC: 7.4 10*3/uL (ref 3.4–10.8)

## 2021-07-30 LAB — LIPID PANEL
Chol/HDL Ratio: 2.9 ratio (ref 0.0–5.0)
Cholesterol, Total: 135 mg/dL (ref 100–199)
HDL: 47 mg/dL (ref >39)
LDL Chol Calc (NIH): 60 mg/dL (ref 0–99)
Triglycerides: 164 mg/dL — ABNORMAL HIGH (ref 0–149)
VLDL Cholesterol Cal: 28 mg/dL (ref 5–40)

## 2021-07-31 LAB — MICROALBUMIN / CREATININE URINE RATIO
Creatinine, Urine: 12.4 mg/dL
Microalb/Creat Ratio: <24 mg/g creat (ref 0–29)
Microalbumin, Urine: <3 ug/mL

## 2021-08-02 ENCOUNTER — Encounter: Payer: Self-pay | Admitting: Family Medicine

## 2021-08-02 DIAGNOSIS — Z79899 Other long term (current) drug therapy: Secondary | ICD-10-CM | POA: Insufficient documentation

## 2021-08-23 DIAGNOSIS — E119 Type 2 diabetes mellitus without complications: Secondary | ICD-10-CM | POA: Diagnosis not present

## 2021-08-23 DIAGNOSIS — H40033 Anatomical narrow angle, bilateral: Secondary | ICD-10-CM | POA: Diagnosis not present

## 2021-09-01 DIAGNOSIS — S338XXA Sprain of other parts of lumbar spine and pelvis, initial encounter: Secondary | ICD-10-CM | POA: Diagnosis not present

## 2021-09-01 DIAGNOSIS — S233XXA Sprain of ligaments of thoracic spine, initial encounter: Secondary | ICD-10-CM | POA: Diagnosis not present

## 2021-09-01 DIAGNOSIS — S134XXA Sprain of ligaments of cervical spine, initial encounter: Secondary | ICD-10-CM | POA: Diagnosis not present

## 2021-09-07 DIAGNOSIS — S233XXA Sprain of ligaments of thoracic spine, initial encounter: Secondary | ICD-10-CM | POA: Diagnosis not present

## 2021-09-07 DIAGNOSIS — S338XXA Sprain of other parts of lumbar spine and pelvis, initial encounter: Secondary | ICD-10-CM | POA: Diagnosis not present

## 2021-09-07 DIAGNOSIS — S134XXA Sprain of ligaments of cervical spine, initial encounter: Secondary | ICD-10-CM | POA: Diagnosis not present

## 2021-09-12 DIAGNOSIS — S338XXA Sprain of other parts of lumbar spine and pelvis, initial encounter: Secondary | ICD-10-CM | POA: Diagnosis not present

## 2021-09-12 DIAGNOSIS — S233XXA Sprain of ligaments of thoracic spine, initial encounter: Secondary | ICD-10-CM | POA: Diagnosis not present

## 2021-09-12 DIAGNOSIS — S134XXA Sprain of ligaments of cervical spine, initial encounter: Secondary | ICD-10-CM | POA: Diagnosis not present

## 2021-09-20 DIAGNOSIS — S134XXA Sprain of ligaments of cervical spine, initial encounter: Secondary | ICD-10-CM | POA: Diagnosis not present

## 2021-09-20 DIAGNOSIS — S233XXA Sprain of ligaments of thoracic spine, initial encounter: Secondary | ICD-10-CM | POA: Diagnosis not present

## 2021-09-20 DIAGNOSIS — S338XXA Sprain of other parts of lumbar spine and pelvis, initial encounter: Secondary | ICD-10-CM | POA: Diagnosis not present

## 2021-09-30 DIAGNOSIS — S134XXA Sprain of ligaments of cervical spine, initial encounter: Secondary | ICD-10-CM | POA: Diagnosis not present

## 2021-09-30 DIAGNOSIS — S338XXA Sprain of other parts of lumbar spine and pelvis, initial encounter: Secondary | ICD-10-CM | POA: Diagnosis not present

## 2021-09-30 DIAGNOSIS — S233XXA Sprain of ligaments of thoracic spine, initial encounter: Secondary | ICD-10-CM | POA: Diagnosis not present

## 2021-11-08 ENCOUNTER — Encounter: Payer: Self-pay | Admitting: Family Medicine

## 2021-11-11 ENCOUNTER — Encounter: Payer: Self-pay | Admitting: Family Medicine

## 2021-11-11 ENCOUNTER — Ambulatory Visit: Payer: BC Managed Care – PPO | Admitting: Family Medicine

## 2021-11-11 VITALS — BP 115/78 | HR 83 | Temp 97.9°F | Ht 69.0 in | Wt 192.6 lb

## 2021-11-11 DIAGNOSIS — F900 Attention-deficit hyperactivity disorder, predominantly inattentive type: Secondary | ICD-10-CM

## 2021-11-11 DIAGNOSIS — Z79899 Other long term (current) drug therapy: Secondary | ICD-10-CM

## 2021-11-11 DIAGNOSIS — E785 Hyperlipidemia, unspecified: Secondary | ICD-10-CM

## 2021-11-11 DIAGNOSIS — F411 Generalized anxiety disorder: Secondary | ICD-10-CM

## 2021-11-11 DIAGNOSIS — E663 Overweight: Secondary | ICD-10-CM

## 2021-11-11 DIAGNOSIS — E1165 Type 2 diabetes mellitus with hyperglycemia: Secondary | ICD-10-CM

## 2021-11-11 DIAGNOSIS — I10 Essential (primary) hypertension: Secondary | ICD-10-CM

## 2021-11-11 LAB — CMP14+EGFR
ALT: 45 IU/L — ABNORMAL HIGH (ref 0–44)
AST: 48 IU/L — ABNORMAL HIGH (ref 0–40)
Albumin/Globulin Ratio: 1.9 (ref 1.2–2.2)
Albumin: 4.8 g/dL (ref 4.0–5.0)
Alkaline Phosphatase: 72 IU/L (ref 44–121)
BUN/Creatinine Ratio: 7 — ABNORMAL LOW (ref 9–20)
BUN: 7 mg/dL (ref 6–24)
Bilirubin Total: 0.6 mg/dL (ref 0.0–1.2)
CO2: 25 mmol/L (ref 20–29)
Calcium: 10.1 mg/dL (ref 8.7–10.2)
Chloride: 100 mmol/L (ref 96–106)
Creatinine, Ser: 0.95 mg/dL (ref 0.76–1.27)
Globulin, Total: 2.5 g/dL (ref 1.5–4.5)
Glucose: 139 mg/dL — ABNORMAL HIGH (ref 70–99)
Potassium: 4.8 mmol/L (ref 3.5–5.2)
Sodium: 140 mmol/L (ref 134–144)
Total Protein: 7.3 g/dL (ref 6.0–8.5)
eGFR: 102 mL/min/{1.73_m2} (ref >59)

## 2021-11-11 LAB — CBC WITH DIFFERENTIAL/PLATELET
Basophils Absolute: 0.1 10*3/uL (ref 0.0–0.2)
Basos: 1 %
EOS (ABSOLUTE): 0.2 10*3/uL (ref 0.0–0.4)
Eos: 4 %
Hematocrit: 44.4 % (ref 37.5–51.0)
Hemoglobin: 15.1 g/dL (ref 13.0–17.7)
Immature Grans (Abs): 0 10*3/uL (ref 0.0–0.1)
Immature Granulocytes: 0 %
Lymphocytes Absolute: 1.9 10*3/uL (ref 0.7–3.1)
Lymphs: 35 %
MCH: 29.1 pg (ref 26.6–33.0)
MCHC: 34 g/dL (ref 31.5–35.7)
MCV: 86 fL (ref 79–97)
Monocytes Absolute: 0.5 10*3/uL (ref 0.1–0.9)
Monocytes: 8 %
Neutrophils Absolute: 2.9 10*3/uL (ref 1.4–7.0)
Neutrophils: 52 %
Platelets: 252 10*3/uL (ref 150–450)
RBC: 5.19 x10E6/uL (ref 4.14–5.80)
RDW: 12 % (ref 11.6–15.4)
WBC: 5.6 10*3/uL (ref 3.4–10.8)

## 2021-11-11 LAB — LIPID PANEL
Chol/HDL Ratio: 2.9 ratio (ref 0.0–5.0)
Cholesterol, Total: 156 mg/dL (ref 100–199)
HDL: 54 mg/dL (ref >39)
LDL Chol Calc (NIH): 78 mg/dL (ref 0–99)
Triglycerides: 138 mg/dL (ref 0–149)
VLDL Cholesterol Cal: 24 mg/dL (ref 5–40)

## 2021-11-11 LAB — BAYER DCA HB A1C WAIVED: HB A1C (BAYER DCA - WAIVED): 6.1 % — ABNORMAL HIGH (ref 4.8–5.6)

## 2021-11-11 MED ORDER — LISDEXAMFETAMINE DIMESYLATE 40 MG PO CAPS: 40.0000 mg | ORAL_CAPSULE | Freq: Every day | ORAL | 0 refills | Status: DC

## 2021-11-11 NOTE — Progress Notes (Signed)
Assessment & Plan:  1. Controlled type 2 diabetes mellitus with hyperglycemia, without long-term current use of insulin (HCC) Lab Results  Component Value Date   HGBA1C 6.3 (H) 07/29/2021   HGBA1C 5.3 04/29/2021   HGBA1C 6.9 01/27/2021  A1c 6.1 today which has decreased from 6.3 three months ago. - Diabetes is at goal of A1c < 7. - Medications: continue current medications - Home glucose monitoring: encouraged to resume wearing freestyle libre - Patient is currently taking a statin. Patient is not taking an ACE-inhibitor/ARB.  - Instruction/counseling given: discussed foot care  Diabetes Health Maintenance Due  Topic Date Due   HEMOGLOBIN A1C  10/29/2021   OPHTHALMOLOGY EXAM  07/26/2022   URINE MICROALBUMIN  07/30/2022   FOOT EXAM  11/12/2022    Lab Results  Component Value Date   LABMICR <3.0 07/29/2021   LABMICR 47.2 08/03/2020   - Lipid panel - CBC with Differential/Platelet - CMP14+EGFR - Bayer DCA Hb A1c Waived  2-3. Attention deficit hyperactivity disorder (ADHD), predominantly inattentive type/Controlled substance agreement signed Well controlled on current regimen. Controlled substance agreement in place. Urine drug screen as expected. PDMP reviewed with no concerning findings.  - CMP14+EGFR - lisdexamfetamine (VYVANSE) 40 MG capsule; Take 1 capsule (40 mg total) by mouth daily.  Dispense: 30 capsule; Refill: 0 - lisdexamfetamine (VYVANSE) 40 MG capsule; Take 1 capsule (40 mg total) by mouth daily.  Dispense: 30 capsule; Refill: 0 - lisdexamfetamine (VYVANSE) 40 MG capsule; Take 1 capsule (40 mg total) by mouth daily.  Dispense: 30 capsule; Refill: 0  4. Essential hypertension Well controlled on current regimen.  - Lipid panel - CBC with Differential/Platelet - CMP14+EGFR  5. Dyslipidemia Well controlled on current regimen.  - Lipid panel - CBC with Differential/Platelet - CMP14+EGFR  6. Anxiety, generalized Well controlled on current regimen.  -  CMP14+EGFR  7. Overweight (BMI 25.0-29.9) Encouraged healthy eating and exercise. - Lipid panel - CBC with Differential/Platelet - CMP14+EGFR   Return in about 3 months (around 02/11/2022) for ADHD.  Hendricks Limes, MSN, APRN, FNP-C [facility]  Subjective:    [name], Randall Murphy    DOB: July 16, 1980, 41 y.o.   MRN: [id number]  Patient Care Team: Loman Brooklyn, FNP as PCP - General (Family Medicine) Gala Romney Cristopher Estimable, MD as Consulting Physician (Gastroenterology) Harlen Labs, MD as Referring Physician (Optometry)   Chief Complaint:  Chief Complaint  Patient presents with   ADHD    3 month follow up     HPI: Randall Murphy is a 41 y.o. Randall Murphy presenting on 11/11/2021 for ADHD (3 month follow up )  Diabetes: Patient presents for follow up of diabetes. Current symptoms include: hyperglycemia. Known diabetic complications: none. Medication compliance: patient has been using Ozempic once weekly and is tolerating it well. Current diet: in general, an "unhealthy" diet. Current exercise: none. Home blood sugar records: not currently wearing his freestyle libre, but will resume.  Is he  on ACE inhibitor or angiotensin II receptor blocker? No. Is he on a statin? Yes (pravastatin).   Hyperlipidemia: tolerating pravastatin.  Hypertension: Patient has been taking amlodipine daily as prescribed. He does not check his BP at home. No exercise.   Anxiety: Feels he is doing well with Lexapro 10 mg once daily. Does not need a dosage change.     11/11/2021    8:38 AM 07/29/2021    3:15 PM 04/29/2021    8:49 AM  Depression screen PHQ 2/9  Decreased Interest 0 1 2  Down, Depressed, Hopeless 0 1 0  PHQ - 2 Score 0 2 2  Altered sleeping '1 1 1  ' Tired, decreased energy 0 1 1  Change in appetite 1 1 0  Feeling bad or failure about yourself  1 1 0  Trouble concentrating 0 1 2  Moving slowly or fidgety/restless 0 1 0  Suicidal thoughts 0 0 0  PHQ-9 Score '3 8  6  ' Difficult doing work/chores Not difficult at all Not difficult at all Not difficult at all      11/11/2021    8:38 AM 07/29/2021    3:16 PM 04/29/2021    8:50 AM 01/27/2021   10:55 AM  GAD 7 : Generalized Anxiety Score  Nervous, Anxious, on Edge 1 1 0 0  Control/stop worrying 0 1 1 0  Worry too much - different things 0 1 1 0  Trouble relaxing 0 '1 1 1  ' Restless 0 '1 1 1  ' Easily annoyed or irritable 0 1 1 0  Afraid - awful might happen 0 0 0 0  Total GAD 7 Score '1 6 5 2  ' Anxiety Difficulty Not difficult at all Not difficult at all Not difficult at all Somewhat difficult   ADD: Patient was diagnosed with ADD as a teenager and has taken medication intermittently since then.  In the past when working at a fast-paced job he was able to keep up and did not need the medication.  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 He has tried Oncologist and did not feel it was helpful for him. He was then transition to Vyvanse 30 mg daily, which worked well until lunchtime.  Previously this was increased to 40 mg daily, which he reports is currently working very well for him.  New complaints: None    Social history:  Relevant past medical, surgical, family and social history reviewed and updated as indicated. Interim medical history since our last visit reviewed.  Allergies and medications reviewed and updated.  DATA REVIEWED: CHART IN EPIC  ROS: Negative unless specifically indicated above in HPI.    Current Outpatient Medications:    amLODipine (NORVASC) 10 MG tablet, Take 1 tablet (10 mg total) by mouth daily., Disp: 90 tablet, Rfl: 1   Continuous Blood Gluc Sensor (FREESTYLE LIBRE 3 SENSOR) MISC, 1 Device by Does not apply route every 14 (fourteen) days. Place 1 sensor on the skin every 14 days. Use to check glucose continuously, Disp: 2 each, Rfl: 2   escitalopram (LEXAPRO) 10 MG tablet, Take 1 tablet (10 mg total) by mouth daily.,  Disp: 90 tablet, Rfl: 1   lisdexamfetamine (VYVANSE) 40 MG capsule, Take 1 capsule (40 mg total) by mouth daily., Disp: 30 capsule, Rfl: 0   lisdexamfetamine (VYVANSE) 40 MG capsule, Take 1 capsule (40 mg total) by mouth daily., Disp: 30 capsule, Rfl: 0   lisdexamfetamine (VYVANSE) 40 MG capsule, Take 1 capsule (40 mg total) by mouth daily., Disp: 30 capsule, Rfl: 0   pravastatin (PRAVACHOL) 40 MG tablet, Take 1 tablet (40 mg total) by mouth daily., Disp: 90 tablet, Rfl: 1   Semaglutide, 2 MG/DOSE, 8 MG/3ML SOPN, Inject 2 mg as directed once a week., Disp: 9 mL, Rfl: 1   No Known Allergies Past Medical History:  Diagnosis Date   ADD (attention deficit disorder) 02/22/2015   Anxiety, generalized 02/15/2016   Diabetes mellitus without complication ([place])  Dyslipidemia 02/22/2015   Elevated liver enzymes    Fatty liver 03/23/2015   Hypertension     History reviewed. No pertinent surgical history.  Social History   Socioeconomic History   Marital status: Married    Spouse name: Not on file   Number of children: 2   Years of education: Not on file   Highest education level: Not on file  Occupational History   Occupation: IT  Tobacco Use   Smoking status: Never   Smokeless tobacco: Never  Vaping Use   Vaping Use: Never used  Substance and Sexual Activity   Alcohol use: Yes    Alcohol/week: 10.0 standard drinks of alcohol    Types: 10 Cans of beer per week    Comment: daily   Drug use: Never   Sexual activity: Yes    Birth control/protection: Condom  Other Topics Concern   Not on file  Social History Narrative   Not on file   Social Determinants of Health   Financial Resource Strain: Not on file  Food Insecurity: Not on file  Transportation Needs: Not on file  Physical Activity: Not on file  Stress: Not on file  Social Connections: Not on file  Intimate Partner Violence: Not on file        Objective:    BP 115/78   Pulse 83   Temp 97.9 F (36.6 C) (Temporal)    Ht '5\' 9"'  (1.753 m)   Wt 192 lb 9.6 oz (87.4 kg)   SpO2 99%   BMI 28.44 kg/m   Wt Readings from Last 3 Encounters:  11/11/21 192 lb 9.6 oz (87.4 kg)  07/29/21 199 lb 3.2 oz (90.4 kg)  04/29/21 194 lb 3.2 oz (88.1 kg)    Physical Exam Vitals reviewed.  Constitutional:      General: He is not in acute distress.    Appearance: Normal appearance. He is overweight. He is not ill-appearing, toxic-appearing or diaphoretic.  HENT:     Head: Normocephalic and atraumatic.  Eyes:     General: No scleral icterus.       Right eye: No discharge.        Left eye: No discharge.     Conjunctiva/sclera: Conjunctivae normal.  Cardiovascular:     Rate and Rhythm: Normal rate and regular rhythm.     Heart sounds: Normal heart sounds. No murmur heard.    No friction rub. No gallop.  Pulmonary:     Effort: Pulmonary effort is normal. No respiratory distress.     Breath sounds: Normal breath sounds. No stridor. No wheezing, rhonchi or rales.  Musculoskeletal:        General: Normal range of motion.     Cervical back: Normal range of motion.  Skin:    General: Skin is warm and dry.  Neurological:     Mental Status: He is alert and oriented to person, place, and time. Mental status is at baseline.  Psychiatric:        Mood and Affect: Mood normal.        Behavior: Behavior normal.        Thought Content: Thought content normal.        Judgment: Judgment normal.    Diabetic Foot Exam - Simple   Simple Foot Form Diabetic Foot exam was performed with the following findings: Yes 11/11/2021  8:28 AM  Visual Inspection No deformities, no ulcerations, no other skin breakdown bilaterally: Yes Sensation Testing Intact to touch and monofilament testing bilaterally:  Yes Pulse Check Posterior Tibialis and Dorsalis pulse intact bilaterally: Yes Comments     No results found for: "TSH" Lab Results  Component Value Date   WBC 7.4 07/29/2021   HGB 14.8 07/29/2021   HCT 43.7 07/29/2021   MCV 86  07/29/2021   PLT 245 07/29/2021   Lab Results  Component Value Date   NA 134 07/29/2021   K 3.3 (L) 07/29/2021   CO2 22 07/29/2021   GLUCOSE 193 (H) 07/29/2021   BUN 8 07/29/2021   CREATININE 0.90 07/29/2021   BILITOT 0.6 07/29/2021   ALKPHOS 72 07/29/2021   AST 42 (H) 07/29/2021   ALT 38 07/29/2021   PROT 7.1 07/29/2021   ALBUMIN 4.6 07/29/2021   CALCIUM 9.5 07/29/2021   ANIONGAP 9 02/06/2018   EGFR 109 07/29/2021   Lab Results  Component Value Date   CHOL 135 07/29/2021   Lab Results  Component Value Date   HDL 47 07/29/2021   Lab Results  Component Value Date   LDLCALC 60 07/29/2021   Lab Results  Component Value Date   TRIG 164 (H) 07/29/2021   Lab Results  Component Value Date   CHOLHDL 2.9 07/29/2021   Lab Results  Component Value Date   HGBA1C 6.3 (H) 07/29/2021

## 2022-01-18 ENCOUNTER — Other Ambulatory Visit: Payer: Self-pay | Admitting: Family Medicine

## 2022-01-18 DIAGNOSIS — I10 Essential (primary) hypertension: Secondary | ICD-10-CM

## 2022-01-18 DIAGNOSIS — E785 Hyperlipidemia, unspecified: Secondary | ICD-10-CM

## 2022-01-18 DIAGNOSIS — E1165 Type 2 diabetes mellitus with hyperglycemia: Secondary | ICD-10-CM

## 2022-02-14 ENCOUNTER — Ambulatory Visit: Payer: BC Managed Care – PPO | Admitting: Family Medicine

## 2022-02-14 ENCOUNTER — Encounter: Payer: Self-pay | Admitting: Family Medicine

## 2022-02-14 VITALS — BP 108/71 | HR 87 | Temp 97.8°F | Resp 20 | Ht 69.0 in | Wt 194.0 lb

## 2022-02-14 DIAGNOSIS — E1165 Type 2 diabetes mellitus with hyperglycemia: Secondary | ICD-10-CM | POA: Diagnosis not present

## 2022-02-14 DIAGNOSIS — F900 Attention-deficit hyperactivity disorder, predominantly inattentive type: Secondary | ICD-10-CM

## 2022-02-14 DIAGNOSIS — Z79899 Other long term (current) drug therapy: Secondary | ICD-10-CM | POA: Diagnosis not present

## 2022-02-14 DIAGNOSIS — I10 Essential (primary) hypertension: Secondary | ICD-10-CM | POA: Diagnosis not present

## 2022-02-14 DIAGNOSIS — E785 Hyperlipidemia, unspecified: Secondary | ICD-10-CM

## 2022-02-14 DIAGNOSIS — F411 Generalized anxiety disorder: Secondary | ICD-10-CM

## 2022-02-14 LAB — CMP14+EGFR
ALT: 38 IU/L (ref 0–44)
AST: 40 IU/L (ref 0–40)
Albumin/Globulin Ratio: 1.6 (ref 1.2–2.2)
Albumin: 4.5 g/dL (ref 4.1–5.1)
Alkaline Phosphatase: 69 IU/L (ref 44–121)
BUN/Creatinine Ratio: 9 (ref 9–20)
BUN: 9 mg/dL (ref 6–24)
Bilirubin Total: 0.6 mg/dL (ref 0.0–1.2)
CO2: 21 mmol/L (ref 20–29)
Calcium: 9.7 mg/dL (ref 8.7–10.2)
Chloride: 101 mmol/L (ref 96–106)
Creatinine, Ser: 1.02 mg/dL (ref 0.76–1.27)
Globulin, Total: 2.8 g/dL (ref 1.5–4.5)
Glucose: 149 mg/dL — ABNORMAL HIGH (ref 70–99)
Potassium: 4.4 mmol/L (ref 3.5–5.2)
Sodium: 139 mmol/L (ref 134–144)
Total Protein: 7.3 g/dL (ref 6.0–8.5)
eGFR: 94 mL/min/{1.73_m2} (ref >59)

## 2022-02-14 LAB — BAYER DCA HB A1C WAIVED: HB A1C (BAYER DCA - WAIVED): 6.2 % — ABNORMAL HIGH (ref 4.8–5.6)

## 2022-02-14 MED ORDER — LISDEXAMFETAMINE DIMESYLATE 40 MG PO CAPS: 40.0000 mg | ORAL_CAPSULE | Freq: Every day | ORAL | 0 refills | Status: DC

## 2022-02-14 MED ORDER — ESCITALOPRAM OXALATE 10 MG PO TABS: 10.0000 mg | ORAL_TABLET | Freq: Every day | ORAL | 1 refills | Status: DC

## 2022-02-14 MED ORDER — AMLODIPINE BESYLATE 10 MG PO TABS: 10.0000 mg | ORAL_TABLET | Freq: Every day | ORAL | 1 refills | Status: DC

## 2022-02-14 MED ORDER — PRAVASTATIN SODIUM 40 MG PO TABS: 40.0000 mg | ORAL_TABLET | Freq: Every day | ORAL | 1 refills | Status: DC

## 2022-02-14 MED ORDER — OZEMPIC (2 MG/DOSE) 8 MG/3ML ~~LOC~~ SOPN: 2.0000 mg | PEN_INJECTOR | SUBCUTANEOUS | 1 refills | Status: DC

## 2022-02-14 MED ORDER — FREESTYLE LIBRE 3 SENSOR MISC: 1.0000 | 2 refills | Status: AC

## 2022-02-14 NOTE — Progress Notes (Signed)
Assessment & Plan:  1. Controlled type 2 diabetes mellitus with hyperglycemia, without long-term current use of insulin (HCC) A1c 6.2 today, slightly higher than 6.1 three months ago. - Diabetes is at goal of A1c < 7. - Medications: continue current medications - Home glucose monitoring: continue wearing freestyle libre - Patient is currently taking a statin. Patient is not taking an ACE-inhibitor/ARB.  - Instruction/counseling given: discussed diet  Diabetes Health Maintenance Due  Topic Date Due   HEMOGLOBIN A1C  02/11/2022   OPHTHALMOLOGY EXAM  07/26/2022   FOOT EXAM  11/12/2022    Lab Results  Component Value Date   LABMICR <3.0 07/29/2021   LABMICR 47.2 08/03/2020   - CMP14+EGFR - Bayer DCA Hb A1c Waived - Semaglutide, 2 MG/DOSE, (OZEMPIC, 2 MG/DOSE,) 8 MG/3ML SOPN; Inject 2 mg into the skin once a week.  Dispense: 9 mL; Refill: 1 - pravastatin (PRAVACHOL) 40 MG tablet; Take 1 tablet (40 mg total) by mouth daily.  Dispense: 90 tablet; Refill: 1 - Continuous Blood Gluc Sensor (FREESTYLE LIBRE 3 SENSOR) MISC; 1 Device by Does not apply route every 14 (fourteen) days. Place 1 sensor on the skin every 14 days. Use to check glucose continuously  Dispense: 2 each; Refill: 2  2. Essential hypertension Well controlled on current regimen.  - CMP14+EGFR - amLODipine (NORVASC) 10 MG tablet; Take 1 tablet (10 mg total) by mouth daily.  Dispense: 90 tablet; Refill: 1  3. Dyslipidemia Well controlled on current regimen.  - CMP14+EGFR - pravastatin (PRAVACHOL) 40 MG tablet; Take 1 tablet (40 mg total) by mouth daily.  Dispense: 90 tablet; Refill: 1  4-5. Attention deficit hyperactivity disorder (ADHD), predominantly inattentive type/Controlled substance agreement signed Well controlled on current regimen. Controlled substance agreement in place and updated today. Previous urine drug screen as expected; recollected today. PDMP reviewed with no concerning findings.  - CMP14+EGFR -  lisdexamfetamine (VYVANSE) 40 MG capsule; Take 1 capsule (40 mg total) by mouth daily.  Dispense: 30 capsule; Refill: 0 - lisdexamfetamine (VYVANSE) 40 MG capsule; Take 1 capsule (40 mg total) by mouth daily.  Dispense: 30 capsule; Refill: 0 - lisdexamfetamine (VYVANSE) 40 MG capsule; Take 1 capsule (40 mg total) by mouth daily.  Dispense: 30 capsule; Refill: 0 - ToxASSURE Select 13 (MW), Urine  6. Anxiety, generalized Well controlled on current regimen.  - CMP14+EGFR - escitalopram (LEXAPRO) 10 MG tablet; Take 1 tablet (10 mg total) by mouth daily.  Dispense: 90 tablet; Refill: 1   Return in about 3 months (around 05/16/2022) for follow-up of chronic medication conditions with M. Rakes.  Hendricks Limes, MSN, APRN, FNP-C [facility]  Subjective:    [name], Randall Murphy    DOB: [birth year], 41 y.o.   MRN: [id number]  Patient Care Team: Loman Brooklyn, FNP as PCP - General (Family Medicine) Gala Romney Cristopher Estimable, MD as Consulting Physician (Gastroenterology) Harlen Labs, MD as Referring Physician (Optometry)   Chief Complaint:  Chief Complaint  Patient presents with   Medical Management of Chronic Issues    HPI: Randall Murphy is a 41 y.o. Randall Murphy presenting on 02/14/2022 for Medical Management of Chronic Issues  Diabetes: Patient presents for follow up of diabetes. Current symptoms include: hyperglycemia. Known diabetic complications: none. Medication compliance: patient has been using Ozempic once weekly and is tolerating it well. Current diet: in general, an "unhealthy" diet. Current exercise: none. Home blood sugar records: using the freestyle libre and is active 56% of the  time over the past two weeks.  Is he  on ACE inhibitor or angiotensin II receptor blocker? No. Is he on a statin? Yes (pravastatin).    Hyperlipidemia: tolerating pravastatin.  Hypertension: Patient has been taking amlodipine daily as prescribed. He does not check his BP at  home. No exercise.   Anxiety: Feels he is doing well with Lexapro 10 mg once daily. Does not need a dosage change.     02/14/2022    8:00 AM 11/11/2021    8:38 AM 07/29/2021    3:15 PM  Depression screen PHQ 2/9  Decreased Interest 1 0 1  Down, Depressed, Hopeless 1 0 1  PHQ - 2 Score 2 0 2  Altered sleeping _0 Tired, decreased energy 1 0 1  Change in appetite 0 1 1  Feeling bad or failure about yourself  0 1 1  Trouble concentrating 1 0 1  Moving slowly or fidgety/restless 0 0 1  Suicidal thoughts 0 0 0  PHQ-9 Score _1 Difficult doing work/chores Not difficult at all Not difficult at all Not difficult at all      02/14/2022    8:00 AM 11/11/2021    8:38 AM 07/29/2021    3:16 PM 04/29/2021    8:50 AM  GAD 7 : Generalized Anxiety Score  Nervous, Anxious, on Edge _2 0  Control/stop worrying 0 0 1 1  Worry too much - different things 1 0 1 1  Trouble relaxing 0 0 1 1  Restless 0 0 1 1  Easily annoyed or irritable 1 0 1 1  Afraid - awful might happen 0 0 0 0  Total GAD 7 Score _3 Anxiety Difficulty Not difficult at all Not difficult at all Not difficult at all Not difficult at all   ADD: Patient was diagnosed with ADD as a teenager and has taken medication intermittently since then.  In the past when working at a fast-paced job he was able to keep up and did not need the medication.  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 He has tried Oncologist and did not feel it was helpful for him. He is currently taking Vyvanse 40 mg daily, which he reports is currently working very well for him.  New complaints: None    Social history:  Relevant past medical, surgical, family and social history reviewed and updated as indicated. Interim medical history since our last visit reviewed.  Allergies and medications reviewed and updated.  DATA REVIEWED: CHART IN EPIC  ROS: Negative unless specifically indicated above  in HPI.    Current Outpatient Medications:    amLODipine (NORVASC) 10 MG tablet, TAKE 1 TABLET BY MOUTH EVERY DAY, Disp: 90 tablet, Rfl: 0   Continuous Blood Gluc Sensor (FREESTYLE LIBRE 3 SENSOR) MISC, 1 Device by Does not apply route every 14 (fourteen) days. Place 1 sensor on the skin every 14 days. Use to check glucose continuously, Disp: 2 each, Rfl: 2   escitalopram (LEXAPRO) 10 MG tablet, Take 1 tablet (10 mg total) by mouth daily., Disp: 90 tablet, Rfl: 1   lisdexamfetamine (VYVANSE) 40 MG capsule, Take 1 capsule (40 mg total) by mouth daily., Disp: 30 capsule, Rfl: 0   OZEMPIC, 2 MG/DOSE, 8 MG/3ML SOPN, INJECT 2 MG AS DIRECTED ONCE A WEEK., Disp: 9 mL, Rfl: 0   pravastatin (PRAVACHOL) 40 MG tablet, TAKE 1 TABLET  BY MOUTH EVERY DAY, Disp: 90 tablet, Rfl: 0   lisdexamfetamine (VYVANSE) 40 MG capsule, Take 1 capsule (40 mg total) by mouth daily. (Patient not taking: Reported on 02/14/2022), Disp: 30 capsule, Rfl: 0   lisdexamfetamine (VYVANSE) 40 MG capsule, Take 1 capsule (40 mg total) by mouth daily. (Patient not taking: Reported on 02/14/2022), Disp: 30 capsule, Rfl: 0   No Known Allergies Past Medical History:  Diagnosis Date   ADD (attention deficit disorder) 02/22/2015   Anxiety, generalized 02/15/2016   Diabetes mellitus without complication ([place])    Dyslipidemia 02/22/2015   Elevated liver enzymes    Fatty liver 03/23/2015   Hypertension     History reviewed. No pertinent surgical history.  Social History   Socioeconomic History   Marital status: Married    Spouse name: Not on file   Number of children: 2   Years of education: Not on file   Highest education level: Not on file  Occupational History   Occupation: IT  Tobacco Use   Smoking status: Never   Smokeless tobacco: Never  Vaping Use   Vaping Use: Never used  Substance and Sexual Activity   Alcohol use: Yes    Alcohol/week: 10.0 standard drinks of alcohol    Types: 10 Cans of beer per week    Comment: daily    Drug use: Never   Sexual activity: Yes    Birth control/protection: Condom  Other Topics Concern   Not on file  Social History Narrative   Not on file   Social Determinants of Health   Financial Resource Strain: Not on file  Food Insecurity: Not on file  Transportation Needs: Not on file  Physical Activity: Not on file  Stress: Not on file  Social Connections: Not on file  Intimate Partner Violence: Not on file        Objective:    BP 108/71   Pulse 87   Temp 97.8 F (36.6 C)   Resp 20   Ht _0  (1.753 m)   Wt 194 lb (88 kg)   SpO2 98%   BMI 28.65 kg/m   Wt Readings from Last 3 Encounters:  02/14/22 194 lb (88 kg)  11/11/21 192 lb 9.6 oz (87.4 kg)  07/29/21 199 lb 3.2 oz (90.4 kg)    Physical Exam Vitals reviewed.  Constitutional:      General: He is not in acute distress.    Appearance: Normal appearance. He is overweight. He is not ill-appearing, toxic-appearing or diaphoretic.  HENT:     Head: Normocephalic and atraumatic.  Eyes:     General: No scleral icterus.       Right eye: No discharge.        Left eye: No discharge.     Conjunctiva/sclera: Conjunctivae normal.  Cardiovascular:     Rate and Rhythm: Normal rate and regular rhythm.     Heart sounds: Normal heart sounds. No murmur heard.    No friction rub. No gallop.  Pulmonary:     Effort: Pulmonary effort is normal. No respiratory distress.     Breath sounds: Normal breath sounds. No stridor. No wheezing, rhonchi or rales.  Musculoskeletal:        General: Normal range of motion.     Cervical back: Normal range of motion.  Skin:    General: Skin is warm and dry.  Neurological:     Mental Status: He is alert and oriented to person, place, and time. Mental status is at baseline.  Psychiatric:        Mood and Affect: Mood normal.        Behavior: Behavior normal.        Thought Content: Thought content normal.        Judgment: Judgment normal.    Diabetic Foot Exam - Simple   No data  filed     No results found for: "TSH" Lab Results  Component Value Date   WBC 5.6 11/11/2021   HGB 15.1 11/11/2021   HCT 44.4 11/11/2021   MCV 86 11/11/2021   PLT 252 11/11/2021   Lab Results  Component Value Date   NA 140 11/11/2021   K 4.8 11/11/2021   CO2 25 11/11/2021   GLUCOSE 139 (H) 11/11/2021   BUN 7 11/11/2021   CREATININE 0.95 11/11/2021   BILITOT 0.6 11/11/2021   ALKPHOS 72 11/11/2021   AST 48 (H) 11/11/2021   ALT 45 (H) 11/11/2021   PROT 7.3 11/11/2021   ALBUMIN 4.8 11/11/2021   CALCIUM 10.1 11/11/2021   ANIONGAP 9 02/06/2018   EGFR 102 11/11/2021   Lab Results  Component Value Date   CHOL 156 11/11/2021   Lab Results  Component Value Date   HDL 54 11/11/2021   Lab Results  Component Value Date   LDLCALC 78 11/11/2021   Lab Results  Component Value Date   TRIG 138 11/11/2021   Lab Results  Component Value Date   CHOLHDL 2.9 11/11/2021   Lab Results  Component Value Date   HGBA1C 6.1 (H) 11/11/2021

## 2022-02-17 LAB — TOXASSURE SELECT 13 (MW), URINE

## 2022-03-02 ENCOUNTER — Encounter: Payer: Self-pay | Admitting: Family Medicine

## 2022-05-18 ENCOUNTER — Encounter: Payer: Self-pay | Admitting: Internal Medicine

## 2022-05-18 ENCOUNTER — Encounter: Payer: Self-pay | Admitting: Family Medicine

## 2022-05-18 ENCOUNTER — Ambulatory Visit: Payer: BC Managed Care – PPO | Admitting: Family Medicine

## 2022-05-18 VITALS — BP 106/69 | HR 80 | Temp 98.2°F | Ht 69.0 in | Wt 192.6 lb

## 2022-05-18 DIAGNOSIS — F900 Attention-deficit hyperactivity disorder, predominantly inattentive type: Secondary | ICD-10-CM | POA: Diagnosis not present

## 2022-05-18 DIAGNOSIS — E1169 Type 2 diabetes mellitus with other specified complication: Secondary | ICD-10-CM

## 2022-05-18 DIAGNOSIS — Z91148 Patient's other noncompliance with medication regimen for other reason: Secondary | ICD-10-CM

## 2022-05-18 DIAGNOSIS — E785 Hyperlipidemia, unspecified: Secondary | ICD-10-CM

## 2022-05-18 DIAGNOSIS — I152 Hypertension secondary to endocrine disorders: Secondary | ICD-10-CM

## 2022-05-18 DIAGNOSIS — K625 Hemorrhage of anus and rectum: Secondary | ICD-10-CM

## 2022-05-18 DIAGNOSIS — E1165 Type 2 diabetes mellitus with hyperglycemia: Secondary | ICD-10-CM | POA: Diagnosis not present

## 2022-05-18 DIAGNOSIS — Z79899 Other long term (current) drug therapy: Secondary | ICD-10-CM

## 2022-05-18 DIAGNOSIS — E1159 Type 2 diabetes mellitus with other circulatory complications: Secondary | ICD-10-CM

## 2022-05-18 LAB — CMP14+EGFR
ALT: 32 IU/L (ref 0–44)
AST: 32 IU/L (ref 0–40)
Albumin/Globulin Ratio: 1.6 (ref 1.2–2.2)
Albumin: 4.7 g/dL (ref 4.1–5.1)
Alkaline Phosphatase: 65 IU/L (ref 44–121)
BUN/Creatinine Ratio: 5 — ABNORMAL LOW (ref 9–20)
BUN: 6 mg/dL (ref 6–24)
Bilirubin Total: 0.8 mg/dL (ref 0.0–1.2)
CO2: 22 mmol/L (ref 20–29)
Calcium: 10.3 mg/dL — ABNORMAL HIGH (ref 8.7–10.2)
Chloride: 98 mmol/L (ref 96–106)
Creatinine, Ser: 1.13 mg/dL (ref 0.76–1.27)
Globulin, Total: 2.9 g/dL (ref 1.5–4.5)
Glucose: 152 mg/dL — ABNORMAL HIGH (ref 70–99)
Potassium: 4.2 mmol/L (ref 3.5–5.2)
Sodium: 137 mmol/L (ref 134–144)
Total Protein: 7.6 g/dL (ref 6.0–8.5)
eGFR: 83 mL/min/{1.73_m2} (ref >59)

## 2022-05-18 LAB — CBC WITH DIFFERENTIAL/PLATELET
Basophils Absolute: 0.1 10*3/uL (ref 0.0–0.2)
Basos: 1 %
EOS (ABSOLUTE): 0.3 10*3/uL (ref 0.0–0.4)
Eos: 5 %
Hematocrit: 42.4 % (ref 37.5–51.0)
Hemoglobin: 14.2 g/dL (ref 13.0–17.7)
Immature Grans (Abs): 0 10*3/uL (ref 0.0–0.1)
Immature Granulocytes: 0 %
Lymphocytes Absolute: 1.8 10*3/uL (ref 0.7–3.1)
Lymphs: 29 %
MCH: 27.3 pg (ref 26.6–33.0)
MCHC: 33.5 g/dL (ref 31.5–35.7)
MCV: 81 fL (ref 79–97)
Monocytes Absolute: 0.8 10*3/uL (ref 0.1–0.9)
Monocytes: 13 %
Neutrophils Absolute: 3.3 10*3/uL (ref 1.4–7.0)
Neutrophils: 52 %
Platelets: 298 10*3/uL (ref 150–450)
RBC: 5.21 x10E6/uL (ref 4.14–5.80)
RDW: 12.3 % (ref 11.6–15.4)
WBC: 6.3 10*3/uL (ref 3.4–10.8)

## 2022-05-18 LAB — BAYER DCA HB A1C WAIVED: HB A1C (BAYER DCA - WAIVED): 6.9 % — ABNORMAL HIGH (ref 4.8–5.6)

## 2022-05-18 MED ORDER — LISINOPRIL 10 MG PO TABS: 10.0000 mg | ORAL_TABLET | Freq: Every day | ORAL | 3 refills | Status: AC

## 2022-05-18 MED ORDER — LISDEXAMFETAMINE DIMESYLATE 40 MG PO CAPS: 40.0000 mg | ORAL_CAPSULE | Freq: Every day | ORAL | 0 refills | Status: DC

## 2022-05-18 NOTE — Progress Notes (Signed)
Subjective:  [name], male    DOB: August 02, 1980, 41 y.o.   MRN: [id number]  Patient Care Team: Baruch Gouty, FNP as PCP - General (Family Medicine) Gala Romney Cristopher Estimable, MD as Consulting Physician (Gastroenterology) Harlen Labs, MD as Referring Physician (Optometry)   Chief Complaint:  Establish Care [facility] patient ), Medical Management of Chronic Issues (3 month follow up. Patient states that he cut back drinking and now he gets sick after eating at times. ), and Blood In Stools (X 6 months but has gotten worse )   HPI: Randall Murphy is a 41 y.o. male presenting on 05/18/2022 for [place] [facility]anch Media patient ), Medical Management of Chronic Issues (3 month follow up. Patient states that he cut back drinking and now he gets sick after eating at times. ), and Blood In Stools (X 6 months but has gotten worse )  Pt presents today to establish care with new PCP and for management of chronic medical conditions.   1. Controlled type 2 diabetes mellitus with hyperglycemia, without long-term current use of insulin ([place]) On once weekly Ozempic and tolerating well. Has associated hypertension and hyperlipidemia. Currently not on ACEi or ARB therapy. Is on statin therapy. Denies polyuria, polyphagia, or polydipsia.   2. Controlled substance agreement signed 3. Attention deficit hyperactivity disorder (ADHD), predominantly inattentive type Pt has been diagnosed with ADHD since childhood. Has tried several medications in the past along with cessation of therapy without great control of symptoms. States he was on higher dosing of Vyvanse in the past but is doing well on current dosing. 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 Will update UDS and CSA today. PDMP reviewed and no red flags present.   4. Rectal bleeding Pt reports intermittent rectal bleeding over the last 6 months. States at times it is bright red when  he wipes and at other times his stool is streaked with blood. He also reports a loss of weight. Would like referral to GI.     Relevant past medical, surgical, family, and social history reviewed and updated as indicated.  Allergies and medications reviewed and updated. Data reviewed: Chart in Epic.   Past Medical History:  Diagnosis Date   ADD (attention deficit disorder) 02/22/2015   Anxiety, generalized 02/15/2016   Diabetes mellitus without complication ([place])    Dyslipidemia 02/22/2015   Elevated liver enzymes    Fatty liver 03/23/2015   Hypertension     History reviewed. No pertinent surgical history.  Social History   Socioeconomic History   Marital status: Married    Spouse name: Not on file   Number of children: 2   Years of education: Not on file   Highest education level: Not on file  Occupational History   Occupation: IT  Tobacco Use   Smoking status: Never   Smokeless tobacco: Never  Vaping Use   Vaping Use: Never used  Substance and Sexual Activity   Alcohol use: Yes    Alcohol/week: 4.0 standard drinks of alcohol    Types: 4 Cans of beer per week    Comment: daily   Drug use: Never   Sexual activity: Yes    Birth control/protection: Condom  Other Topics Concern   Not on file  Social History Narrative   Not on file   Social Determinants of Health   Financial Resource Strain: Not  on file  Food Insecurity: Not on file  Transportation Needs: Not on file  Physical Activity: Not on file  Stress: Not on file  Social Connections: Not on file  Intimate Partner Violence: Not on file    Outpatient Encounter Medications as of 05/18/2022  Medication Sig   Continuous Blood Gluc Sensor (FREESTYLE LIBRE 3 SENSOR) MISC 1 Device by Does not apply route every 14 (fourteen) days. Place 1 sensor on the skin every 14 days. Use to check glucose continuously   escitalopram (LEXAPRO) 10 MG tablet Take 1 tablet (10 mg total) by mouth daily.   lisinopril (ZESTRIL) 10  MG tablet Take 1 tablet (10 mg total) by mouth daily.   pravastatin (PRAVACHOL) 40 MG tablet Take 1 tablet (40 mg total) by mouth daily.   Semaglutide, 2 MG/DOSE, (OZEMPIC, 2 MG/DOSE,) 8 MG/3ML SOPN Inject 2 mg into the skin once a week.   [DISCONTINUED] amLODipine (NORVASC) 10 MG tablet Take 1 tablet (10 mg total) by mouth daily.   [DISCONTINUED] lisdexamfetamine (VYVANSE) 40 MG capsule Take 1 capsule (40 mg total) by mouth daily.   [DISCONTINUED] lisdexamfetamine (VYVANSE) 40 MG capsule Take 1 capsule (40 mg total) by mouth daily.   [DISCONTINUED] lisdexamfetamine (VYVANSE) 40 MG capsule Take 1 capsule (40 mg total) by mouth daily.   lisdexamfetamine (VYVANSE) 40 MG capsule Take 1 capsule (40 mg total) by mouth daily.   [START ON 06/17/2022] lisdexamfetamine (VYVANSE) 40 MG capsule Take 1 capsule (40 mg total) by mouth daily.   [START ON 07/17/2022] lisdexamfetamine (VYVANSE) 40 MG capsule Take 1 capsule (40 mg total) by mouth daily.   No facility-administered encounter medications on file as of 05/18/2022.    No Known Allergies  Review of Systems  Constitutional:  Positive for activity change, appetite change, fatigue and unexpected weight change. Negative for chills, diaphoresis and fever.  HENT: Negative.    Eyes: Negative.  Negative for photophobia and visual disturbance.  Respiratory:  Negative for cough, chest tightness and shortness of breath.   Cardiovascular:  Negative for chest pain, palpitations and leg swelling.  Gastrointestinal:  Positive for abdominal pain, anal bleeding, blood in stool and nausea. Negative for abdominal distention, constipation, diarrhea, rectal pain and vomiting.  Endocrine: Negative.   Genitourinary:  Negative for decreased urine volume, difficulty urinating, dysuria, frequency and urgency.  Musculoskeletal:  Negative for arthralgias and myalgias.  Skin: Negative.   Allergic/Immunologic: Negative.   Neurological:  Negative for dizziness, tremors,  seizures, syncope, facial asymmetry, speech difficulty, weakness, light-headedness, numbness and headaches.  Hematological: Negative.   Psychiatric/Behavioral:  Positive for decreased concentration. Negative for agitation, behavioral problems, confusion, dysphoric mood, hallucinations, sleep disturbance and suicidal ideas. The patient is nervous/anxious. The patient is not hyperactive.   All other systems reviewed and are negative.       Objective:  BP 106/69   Pulse 80   Temp 98.2 F (36.8 C) (Temporal)   Ht _0  (1.753 m)   Wt 192 lb 9.6 oz (87.4 kg)   SpO2 99%   BMI 28.44 kg/m    Wt Readings from Last 3 Encounters:  05/18/22 192 lb 9.6 oz (87.4 kg)  02/14/22 194 lb (88 kg)  11/11/21 192 lb 9.6 oz (87.4 kg)    Physical Exam Vitals and nursing note reviewed.  Constitutional:      General: He is not in acute distress.    Appearance: Normal appearance. He is well-developed and well-groomed. He is not ill-appearing, toxic-appearing or diaphoretic.  HENT:  Head: Normocephalic and atraumatic.     Jaw: There is normal jaw occlusion.     Right Ear: Hearing normal.     Left Ear: Hearing normal.     Nose: Nose normal.     Mouth/Throat:     Lips: Pink.     Mouth: Mucous membranes are moist.     Pharynx: Oropharynx is clear. Uvula midline.  Eyes:     General: Lids are normal.     Extraocular Movements: Extraocular movements intact.     Conjunctiva/sclera: Conjunctivae normal.     Pupils: Pupils are equal, round, and reactive to light.  Neck:     Thyroid: No thyroid mass, thyromegaly or thyroid tenderness.     Vascular: No carotid bruit or JVD.     Trachea: Trachea and phonation normal.  Cardiovascular:     Rate and Rhythm: Normal rate and regular rhythm.     Chest Wall: PMI is not displaced.     Pulses: Normal pulses.     Heart sounds: Normal heart sounds. No murmur heard.    No friction rub. No gallop.  Pulmonary:     Effort: Pulmonary effort is normal. No  respiratory distress.     Breath sounds: Normal breath sounds. No wheezing.  Abdominal:     General: Bowel sounds are normal. There is no distension or abdominal bruit.     Palpations: Abdomen is soft. There is no hepatomegaly or splenomegaly.     Tenderness: There is no abdominal tenderness. There is no right CVA tenderness or left CVA tenderness.     Hernia: No hernia is present.  Genitourinary:    Comments: Pt declines exam Musculoskeletal:        General: Normal range of motion.     Cervical back: Normal range of motion and neck supple.     Right lower leg: No edema.     Left lower leg: No edema.  Lymphadenopathy:     Cervical: No cervical adenopathy.  Skin:    General: Skin is warm and dry.     Capillary Refill: Capillary refill takes less than 2 seconds.     Coloration: Skin is not cyanotic, jaundiced or pale.     Findings: No rash.  Neurological:     General: No focal deficit present.     Mental Status: He is alert and oriented to person, place, and time.     Sensory: Sensation is intact.     Motor: Motor function is intact.     Coordination: Coordination is intact.     Gait: Gait is intact.     Deep Tendon Reflexes: Reflexes are normal and symmetric.  Psychiatric:        Attention and Perception: Attention and perception normal.        Mood and Affect: Mood and affect normal.        Speech: Speech normal.        Behavior: Behavior normal. Behavior is cooperative.        Thought Content: Thought content normal.        Cognition and Memory: Cognition and memory normal.        Judgment: Judgment normal.     Results for orders placed or performed in visit on 05/18/22  Bayer DCA Hb A1c Waived  Result Value Ref Range   HB A1C (BAYER DCA - WAIVED) 6.9 (H) 4.8 - 5.6 %       Pertinent labs & imaging results that were available during my care  of the patient were reviewed by me and considered in my medical decision making.  Assessment & Plan:  Elray was seen today for  establish care, medical management of chronic issues and blood in stools.  Diagnoses and all orders for this visit:  Controlled type 2 diabetes mellitus with hyperglycemia, without long-term current use of insulin ([facility]) A1C 6.9, continue current regimen. Will stop amlodipine and start Lisinopril as prescribed. Diet and exercise encouraged.  -     CMP14+EGFR -     Bayer DCA Hb A1c Waived -     lisinopril (ZESTRIL) 10 MG tablet; Take 1 tablet (10 mg total) by mouth daily.  Controlled substance agreement signed Attention deficit hyperactivity disorder (ADHD), predominantly inattentive type Doing well on below, will continue. CSA and UDS updated today. PDMP reviewed and no red flags present.  -     ToxASSURE Select 13 (MW), Urine -     lisdexamfetamine (VYVANSE) 40 MG capsule; Take 1 capsule (40 mg total) by mouth daily. -     lisdexamfetamine (VYVANSE) 40 MG capsule; Take 1 capsule (40 mg total) by mouth daily. -     lisdexamfetamine (VYVANSE) 40 MG capsule; Take 1 capsule (40 mg total) by mouth daily.  Rectal bleeding Declined rectal exam today, would like referral to GI. Will check hemoccult and CBC. Pt aware of red flags which require emergent evaluation and treatment.  -     Fecal occult blood, imunochemical; Future -     Ambulatory referral to Gastroenterology -     CBC with Differential/Platelet  Hypertension associated with diabetes ([place]) Amlodipine changes to lisinopril for renal protection due to T2DM. DASH diet and exercise encouraged.  -     CMP14+EGFR -     Bayer DCA Hb A1c Waived -     lisinopril (ZESTRIL) 10 MG tablet; Take 1 tablet (10 mg total) by mouth daily. -     CBC with Differential/Platelet  Hyperlipidemia associated with type 2 diabetes mellitus ([place]) Continue statin therapy along with diet and exercise.     Continue all other maintenance medications.  Follow up plan: Return in about 3 months (around 08/17/2022), or if symptoms worsen or fail to improve, for  DM, ADD.   Continue healthy lifestyle choices, including diet (rich in fruits, vegetables, and lean proteins, and low in salt and simple carbohydrates) and exercise (at least 30 minutes of moderate physical activity daily).  Educational handout given for DM  The above assessment and management plan was discussed with the patient. The patient verbalized understanding of and has agreed to the management plan. Patient is aware to call the clinic if they develop any new symptoms or if symptoms persist or worsen. Patient is aware when to return to the clinic for a follow-up visit. Patient educated on when it is appropriate to go to the emergency department.   Monia Pouch, FNP-C [facility] [phone]

## 2022-05-18 NOTE — Patient Instructions (Signed)

## 2022-05-19 ENCOUNTER — Other Ambulatory Visit: Payer: BC Managed Care – PPO

## 2022-05-19 DIAGNOSIS — K625 Hemorrhage of anus and rectum: Secondary | ICD-10-CM | POA: Diagnosis not present

## 2022-05-20 LAB — TOXASSURE SELECT 13 (MW), URINE

## 2022-05-22 LAB — FECAL OCCULT BLOOD, IMMUNOCHEMICAL: Fecal Occult Bld: POSITIVE — AB

## 2022-05-23 DIAGNOSIS — Z91148 Patient's other noncompliance with medication regimen for other reason: Secondary | ICD-10-CM | POA: Insufficient documentation

## 2022-05-23 NOTE — Addendum Note (Signed)
Addended by: Sonny Masters on: 05/23/2022 08:07 AM   Modules accepted: Orders

## 2022-06-01 ENCOUNTER — Other Ambulatory Visit: Payer: Self-pay | Admitting: Family Medicine

## 2022-06-01 ENCOUNTER — Encounter: Payer: Self-pay | Admitting: Family Medicine

## 2022-06-01 DIAGNOSIS — F411 Generalized anxiety disorder: Secondary | ICD-10-CM

## 2022-06-01 DIAGNOSIS — F900 Attention-deficit hyperactivity disorder, predominantly inattentive type: Secondary | ICD-10-CM

## 2022-06-12 ENCOUNTER — Encounter: Payer: Self-pay | Admitting: Gastroenterology

## 2022-06-26 NOTE — Progress Notes (Unsigned)
GI Office Note    Referring Provider: Baruch Gouty, FNP Primary Care Physician:  Baruch Gouty, [place]  Primary Gastroenterologist: Elon Alas. Abbey Chatters, DO; previously Dr. Hendricks Limes with [facility], [facility]  Chief Complaint   No chief complaint on file.   History of Present Illness   Randall Murphy is a 42 y.o. male presenting today at the request of Rakes, Connye Burkitt, FNP for ***rectal bleeding.   Visit with PCP 05/18/2022 with reports of intermittent rectal bleeding over the prior 6 months.  At times it is bright red when he wipes at other times of stool streaked with blood.  Also with report of weight loss.  Requested GI referral.  Rectal exam declined.  Check Hemoccult and CBC.  Referral to GI placed.  Last 05/18/2022: A1c 6.9, LFTs within normal limits, glucose 152.  Urine tox failed for THC.  Vyvanse discontinued.  CBC within normal limits.  Fecal occult positive.  History of mildly elevated liver enzymes in June 2023 and dating intermittently back to 2019.  CT A/P in September 2019 with no focal liver lesions, biliary dilation, normal gallbladder, normal CBD.  Evidence of splenomegaly without lesion.  Simple appearing right renal cyst.  Today:     Current Outpatient Medications  Medication Sig Dispense Refill   Continuous Blood Gluc Sensor (FREESTYLE LIBRE 3 SENSOR) MISC 1 Device by Does not apply route every 14 (fourteen) days. Place 1 sensor on the skin every 14 days. Use to check glucose continuously 2 each 2   escitalopram (LEXAPRO) 10 MG tablet Take 1 tablet (10 mg total) by mouth daily. 90 tablet 1   lisinopril (ZESTRIL) 10 MG tablet Take 1 tablet (10 mg total) by mouth daily. 90 tablet 3   pravastatin (PRAVACHOL) 40 MG tablet Take 1 tablet (40 mg total) by mouth daily. 90 tablet 1   Semaglutide, 2 MG/DOSE, (OZEMPIC, 2 MG/DOSE,) 8 MG/3ML SOPN Inject 2 mg into the skin once a week. 9 mL 1   No current facility-administered medications for this visit.    Past Medical  History:  Diagnosis Date   ADD (attention deficit disorder) 02/22/2015   Anxiety, generalized 02/15/2016   Diabetes mellitus without complication ([place])    Dyslipidemia 02/22/2015   Elevated liver enzymes    Fatty liver 03/23/2015   Hypertension     No past surgical history on file.  Family History  Problem Relation Age of Onset   Arthritis Mother    Diabetes Father    Hypertension Father    Stroke Father    Kidney disease Father        ESRD on dialysis   Diabetes Sister    Hypertension Brother    Diabetes Maternal Grandmother    Brain cancer Maternal Grandfather    Heart attack Paternal Grandfather     Allergies as of 06/27/2022   (No Known Allergies)    Social History   Socioeconomic History   Marital status: Married    Spouse name: Not on file   Number of children: 2   Years of education: Not on file   Highest education level: Not on file  Occupational History   Occupation: IT  Tobacco Use   Smoking status: Never   Smokeless tobacco: Never  Vaping Use   Vaping Use: Never used  Substance and Sexual Activity   Alcohol use: Yes    Alcohol/week: 4.0 standard drinks of alcohol    Types: 4 Cans of beer per week  Comment: daily   Drug use: Never   Sexual activity: Yes    Birth control/protection: Condom  Other Topics Concern   Not on file  Social History Narrative   Not on file   Social Determinants of Health   Financial Resource Strain: Not on file  Food Insecurity: Not on file  Transportation Needs: Not on file  Physical Activity: Not on file  Stress: Not on file  Social Connections: Not on file  Intimate Partner Violence: Not on file     Review of Systems   Gen: Denies any fever, chills, fatigue, weight loss, lack of appetite.  CV: Denies chest pain, heart palpitations, peripheral edema, syncope.  Resp: Denies shortness of breath at rest or with exertion. Denies wheezing or cough.  GI: see HPI GU : Denies urinary burning, urinary frequency,  urinary hesitancy MS: Denies joint pain, muscle weakness, cramps, or limitation of movement.  Derm: Denies rash, itching, dry skin Psych: Denies depression, anxiety, memory loss, and confusion Heme: Denies bruising, bleeding, and enlarged lymph nodes.   Physical Exam   There were no vitals taken for this visit.  General:   Alert and oriented. Pleasant and cooperative. Well-nourished and well-developed.  Head:  Normocephalic and atraumatic. Eyes:  Without icterus, sclera clear and conjunctiva pink.  Ears:  Normal auditory acuity. Mouth:  No deformity or lesions, oral mucosa pink.  Lungs:  Clear to auscultation bilaterally. No wheezes, rales, or rhonchi. No distress.  Heart:  S1, S2 present without murmurs appreciated.  Abdomen:  +BS, soft, non-tender and non-distended. No HSM noted. No guarding or rebound. No masses appreciated.  Rectal: *** Msk:  Symmetrical without gross deformities. Normal posture. Extremities:  Without edema. Neurologic:  Alert and  oriented x4;  grossly normal neurologically. Skin:  Intact without significant lesions or rashes. Psych:  Alert and cooperative. Normal mood and affect.   Assessment   Randall Murphy is a 42 y.o. male with a history of ADD, anxiety, diabetes, dyslipidemia, elevated LFTs, fatty liver, HTN*** presenting today for evaluation of rectal bleeding and weight loss.  Rectal bleeding:  Weight loss:   PLAN   *** Colonoscopy?   Venetia Night, MSN, FNP-BC, AGACNP-BC [facility]

## 2022-06-27 ENCOUNTER — Encounter: Payer: Self-pay | Admitting: Gastroenterology

## 2022-06-27 ENCOUNTER — Ambulatory Visit (INDEPENDENT_AMBULATORY_CARE_PROVIDER_SITE_OTHER): Payer: No Typology Code available for payment source | Admitting: Gastroenterology

## 2022-06-27 VITALS — BP 118/81 | HR 83 | Temp 98.1°F | Ht 69.0 in | Wt 191.8 lb

## 2022-06-27 DIAGNOSIS — R112 Nausea with vomiting, unspecified: Secondary | ICD-10-CM

## 2022-06-27 DIAGNOSIS — R195 Other fecal abnormalities: Secondary | ICD-10-CM

## 2022-06-27 DIAGNOSIS — R630 Anorexia: Secondary | ICD-10-CM

## 2022-06-27 DIAGNOSIS — K625 Hemorrhage of anus and rectum: Secondary | ICD-10-CM

## 2022-06-27 DIAGNOSIS — R634 Abnormal weight loss: Secondary | ICD-10-CM

## 2022-06-27 NOTE — Patient Instructions (Addendum)
We are scheduling you for colonoscopy in the near future with Dr. Abbey Chatters.  You received separate instructions regarding your prep however you will need to hold her Ozempic for 1 week prior.  I want you to start taking famotidine 20 mg once daily to see if this helps with your nausea and intermittent vomiting episodes. Follow a GERD diet:  Avoid fried, fatty, greasy, spicy, citrus foods. Avoid caffeine and carbonated beverages. Avoid chocolate. Try eating 4-6 small meals a day rather than 3 large meals. Do not eat within 3 hours of laying down. Prop head of bed up on wood or bricks to create a 6 inch incline.  Use Preparation H once to twice daily for 7 to 10 days and monitor for any improvement rectal bleeding.  We will plan to follow-up 6-8 weeks after your colonoscopy.  Dr. Abbey Chatters will be in touch with you with pathology results once your colonoscopy has been performed.  It was a pleasure to see you today. I want to create trusting relationships with patients. If you receive a survey regarding your visit,  I greatly appreciate you taking time to fill this out on paper or through your MyChart. I value your feedback.  Venetia Night, MSN, FNP-BC, AGACNP-BC [facility]

## 2022-07-06 ENCOUNTER — Ambulatory Visit: Payer: BLUE CROSS/BLUE SHIELD | Admitting: Internal Medicine

## 2022-08-07 ENCOUNTER — Telehealth (INDEPENDENT_AMBULATORY_CARE_PROVIDER_SITE_OTHER): Payer: Self-pay | Admitting: *Deleted

## 2022-08-07 NOTE — Telephone Encounter (Signed)
LMOVM to call back to schedule TCS with Dr. Elisha Ponder, ASA 2, hold ozempic x 1 week

## 2022-10-27 ENCOUNTER — Other Ambulatory Visit: Payer: Self-pay | Admitting: Family Medicine

## 2022-10-27 DIAGNOSIS — E1165 Type 2 diabetes mellitus with hyperglycemia: Secondary | ICD-10-CM

## 2022-10-30 ENCOUNTER — Other Ambulatory Visit: Payer: Self-pay | Admitting: Family Medicine

## 2022-10-30 DIAGNOSIS — E1165 Type 2 diabetes mellitus with hyperglycemia: Secondary | ICD-10-CM

## 2022-10-30 DIAGNOSIS — E785 Hyperlipidemia, unspecified: Secondary | ICD-10-CM

## 2022-10-30 DIAGNOSIS — F411 Generalized anxiety disorder: Secondary | ICD-10-CM

## 2022-11-23 ENCOUNTER — Other Ambulatory Visit: Payer: Self-pay | Admitting: Family Medicine

## 2022-11-23 DIAGNOSIS — F411 Generalized anxiety disorder: Secondary | ICD-10-CM

## 2022-11-23 DIAGNOSIS — E785 Hyperlipidemia, unspecified: Secondary | ICD-10-CM

## 2022-11-23 DIAGNOSIS — E1165 Type 2 diabetes mellitus with hyperglycemia: Secondary | ICD-10-CM

## 2022-11-23 NOTE — Telephone Encounter (Signed)
Called to make an appt, no answer, left message on machine to call back and schedule appt.

## 2022-11-23 NOTE — Telephone Encounter (Signed)
Rakes pt NTBS 30-d given 10/30/22 

## 2022-11-26 ENCOUNTER — Other Ambulatory Visit: Payer: Self-pay | Admitting: Family Medicine

## 2022-11-26 DIAGNOSIS — E1165 Type 2 diabetes mellitus with hyperglycemia: Secondary | ICD-10-CM

## 2022-11-27 ENCOUNTER — Other Ambulatory Visit: Payer: Self-pay | Admitting: Family Medicine

## 2022-11-27 DIAGNOSIS — F411 Generalized anxiety disorder: Secondary | ICD-10-CM

## 2022-11-27 DIAGNOSIS — E785 Hyperlipidemia, unspecified: Secondary | ICD-10-CM

## 2022-11-27 DIAGNOSIS — E1165 Type 2 diabetes mellitus with hyperglycemia: Secondary | ICD-10-CM

## 2023-05-13 ENCOUNTER — Other Ambulatory Visit: Payer: Self-pay | Admitting: Family Medicine

## 2023-05-13 DIAGNOSIS — E1165 Type 2 diabetes mellitus with hyperglycemia: Secondary | ICD-10-CM

## 2023-05-13 DIAGNOSIS — I152 Hypertension secondary to endocrine disorders: Secondary | ICD-10-CM

## 2023-09-04 NOTE — Progress Notes (Signed)
 [facility] [address] [place] [address]   Clinic Day:  09/05/2023  Referring physician: Maxwell Caul, MD  Patient Care Team: Vivien Presto, MD as PCP - General (Family Medicine) Jena Gauss Gerrit Friends, MD as Consulting Physician (Gastroenterology) Michaelle Copas, MD as Referring Physician (Optometry)   ASSESSMENT & PLAN:   Assessment:  1.  Sigmoid colon adenocarcinoma: - Presentation: Rectal bleeding, seen by GI Dr. Lenore Manner - Colonoscopy (07/19/2023): Mild pancolonic diverticulosis.  A single sessile 1 cm nonbleeding polyp of benign appearance found in the transverse colon.  An ulcerated bleeding 8 cm mass of malignant appearance was found in the sigmoid colon.  Mass caused partial obstruction. - CEA (07/20/2023): 39.4 - CT CAP (07/25/2023): Mass within the sigmoid colon.  Few mildly enlarged lymph nodes adjacent to the sigmoid colon.  Tiny left upper lobe pulmonary nodule, indeterminate. - S/p robotic LAR on 08/23/2023 by Dr. Ulysees Barns [facility] health) - Pathology: Invasive colonic adenocarcinoma, moderately differentiated arising in a tubulovillous adenoma, tumor size 6.8 x 5.6 cm.  Tumor location: Distal sigmoid/rectosigmoid colon.  1/31 lymph nodes positive.  All margins negative.  LVI-not identified.  PNI-present.  pT3 pN1a. - Cancer next expanded +RNA insight panel [facility]): Negative  2.  Social/family history: - Lives at home with his wife [place].  He works Paramedic job.  Non-smoker. - Maternal grandfather had brain tumor.  Paternal aunt had pancreatic cancer.   Plan:  1.  Stage III (T3 N1) sigmoid colon adenocarcinoma: - We reviewed pathology reports, imaging studies with the patient and his wife in detail. - Colonoscopy report states that his mass is in the sigmoid colon.  However OR report and pathology report stated the tumor location being in the distal sigmoid/rectosigmoid colon. - I will talk to Dr. Ulysees Barns and pathologist at [facility]  regarding clarification of the site of the tumor. - If it is sigmoid colon cancer, we can proceed with 3 months of CapeOx based on IDEA analysis.  We talked about need for port placement. - If the tumor location is rectosigmoid, recommend 4 months of chemotherapy with chemoradiation. - Will request MSI and MMR testing on the pathology. - Will repeat CEA level along with ferritin, iron panel, CBC and CMP.  If the CEA level is significantly high, consider PET scan. - We also talked about Signatera testing for continued surveillance. - He reported occasional numbness in the great toes.  Will keep an eye on it.   Orders Placed This Encounter  Procedures   IR IMAGING GUIDED PORT INSERTION    Standing Status:   Future    Expected Date:   09/12/2023    Expiration Date:   09/04/2024    Reason for Exam (SYMPTOM  OR DIAGNOSIS REQUIRED):   chemotherapy administration    Preferred Imaging Location?:   [facility]   CBC with Differential    Standing Status:   Future    Number of Occurrences:   1    Expected Date:   09/05/2023    Expiration Date:   09/04/2024   Comprehensive metabolic panel    Standing Status:   Future    Number of Occurrences:   1    Expected Date:   09/05/2023    Expiration Date:   09/04/2024   CEA    Standing Status:   Future    Number of Occurrences:   1    Expected Date:   09/05/2023    Expiration  Date:   09/04/2024   Iron and TIBC ([facility] ONLY)    Standing Status:   Future    Number of Occurrences:   1    Expected Date:   09/05/2023    Expiration Date:   09/04/2024   Ferritin    Standing Status:   Future    Number of Occurrences:   1    Expected Date:   09/05/2023    Expiration Date:   09/04/2024   Ambulatory referral to Social Work    Referral Priority:   Routine    Referral Type:   Consultation    Referral Reason:   Specialty Services Required    Number of Visits Requested:   1      I,Katie Daubenspeck,acting as a Neurosurgeon for Doreatha Massed,  MD.,have documented all relevant documentation on the behalf of Doreatha Massed, MD,as directed by  Doreatha Massed, MD while in the presence of Doreatha Massed, MD.   I, Doreatha Massed MD, have reviewed the above documentation for accuracy and completeness, and I agree with the above.   Doreatha Massed, MD   4/9/20259:46 AM  CHIEF COMPLAINT/PURPOSE OF CONSULT:   Diagnosis: sigmoid colon cancer    Cancer Staging  No matching staging information was found for the patient.    Prior Therapy: Resection on 08/23/23  Current Therapy: CapeOx   HISTORY OF PRESENT ILLNESS:   Oncology History   No history exists.      Randall Murphy is a 43 y.o. male presenting to clinic today for evaluation of sigmoid colon cancer at the request of Dr. Ulysees Barns.  He initially presented to his PCP on 06/19/23 with rectal bleeding. He was referred to Dr. St. Onge Callas in GI at [facility] and underwent colonoscopy on 07/19/23. He was found to have a malignant-appearing partially-obstructing mass in sigmoid colon. He underwent staging CT C/A/P on 07/25/23 showing: 3 cm mass within sigmoid colon; few mildly enlarged lymph nodes adjacent to sigmoid colon, with index partially necrotic lymph node measuring 1.7 cm; tiny indeterminate LUL pulmonary nodule.  He was referred to colorectal surgery at [facility] and was taken for resection on 08/23/23 under Dr. Ulysees Barns. Pathology confirmed 6.8 cm invasive colonic adenocarcinoma, moderately differentiated, arising in a tubulovillous adenoma. Out of 31 biopsied lymph nodes, one was positive for metastatic disease.  Today, he states that he is doing well overall. His appetite level is at 100%. His energy level is at 50%.  PAST MEDICAL HISTORY:   Past Medical History: Past Medical History:  Diagnosis Date   ADD (attention deficit disorder) 02/22/2015   Anxiety, generalized 02/15/2016   Diabetes mellitus without complication (HCC)    Dyslipidemia 02/22/2015   Elevated liver  enzymes    Fatty liver 03/23/2015   Hypertension     Surgical History: History reviewed. No pertinent surgical history.  Social History: Social History   Socioeconomic History   Marital status: Married    Spouse name: Not on file   Number of children: 2   Years of education: Not on file   Highest education level: Not on file  Occupational History   Occupation: IT  Tobacco Use   Smoking status: Never   Smokeless tobacco: Never  Vaping Use   Vaping status: Never Used  Substance and Sexual Activity   Alcohol use: Yes    Alcohol/week: 4.0 standard drinks of alcohol    Types: 4 Cans of beer per week    Comment: daily   Drug use: Never   Sexual activity: Yes  Birth control/protection: Condom  Other Topics Concern   Not on file  Social History Narrative   Not on file   Social Drivers of Health   Financial Resource Strain: Low Risk  (07/20/2023)   Received from [facility]   Overall Financial Resource Strain (CARDIA)    Difficulty of Paying Living Expenses: Not very hard  Food Insecurity: No Food Insecurity (08/23/2023)   Received [facility]   Hunger Vital Sign    Worried About Running Out of Food in the Last Year: Never true    Ran Out of Food in the Last Year: Never true  Transportation Needs: No Transportation Needs (08/23/2023)   Received from [facility] - Transportation    Lack of Transportation (Medical): No    Lack of Transportation (Non-Medical): No  Physical Activity: Insufficiently Active (07/20/2023)   Received from [facility]   Exercise Vital Sign    Days of Exercise per Week: 2 days    Minutes of Exercise per Session: 20 min  Stress: No Stress Concern Present (08/23/2023)   Received from [facility] - Occupational Stress Questionnaire    Feeling of Stress : Not at all  Social Connections: Moderately Integrated (07/20/2023)   Received from [facility]   Social Network    How would  you rate your social network (family, work, friends)?: Adequate participation with social networks  Intimate Partner Violence: Not At Risk (08/23/2023)   Received from [facility]   HITS    Over the last 12 months how often did your partner physically hurt you?: Never    Over the last 12 months how often did your partner insult you or talk down to you?: Never    Over the last 12 months how often did your partner threaten you with physical harm?: Never    Over the last 12 months how often did your partner scream or curse at you?: Never    Family History: Family History  Problem Relation Age of Onset   Arthritis Mother    Diabetes Father    Hypertension Father    Stroke Father    Kidney disease Father        ESRD on dialysis   Diabetes Sister    Hypertension Brother    Diabetes Maternal Grandmother    Brain cancer Maternal Grandfather    Heart attack Paternal Grandfather     Current Medications:  Current Outpatient Medications:    oxyCODONE (OXY IR/ROXICODONE) 5 MG immediate release tablet, Take 5 mg by mouth every 4 (four) hours as needed., Disp: , Rfl:    Continuous Blood Gluc Sensor (FREESTYLE LIBRE 3 SENSOR) MISC, 1 Device by Does not apply route every 14 (fourteen) days. Place 1 sensor on the skin every 14 days. Use to check glucose continuously, Disp: 2 each, Rfl: 2   escitalopram (LEXAPRO) 10 MG tablet, Take 1 tablet (10 mg total) by mouth daily. (NEEDS TO BE SEEN BEFORE NEXT REFILL), Disp: 30 tablet, Rfl: 0   lisinopril (ZESTRIL) 10 MG tablet, Take 1 tablet (10 mg total) by mouth daily., Disp: 90 tablet, Rfl: 3   pravastatin (PRAVACHOL) 40 MG tablet, Take 1 tablet (40 mg total) by mouth daily. (NEEDS TO BE SEEN BEFORE NEXT REFILL), Disp: 30 tablet, Rfl: 0   Semaglutide, 2 MG/DOSE, (OZEMPIC, 2 MG/DOSE,) 8 MG/3ML SOPN, Inject 2 mg into the skin once a week. (NEEDS TO BE SEEN BEFORE NEXT REFILL), Disp: 3 mL,  Rfl: 0   VYVANSE 40 MG capsule, Take by mouth., Disp: , Rfl:     Allergies: No Known Allergies  REVIEW OF SYSTEMS:   Review of Systems  Constitutional:  Positive for fatigue. Negative for chills and fever.  HENT:   Negative for lump/mass, mouth sores, nosebleeds, sore throat and trouble swallowing.   Eyes:  Negative for eye problems.  Respiratory:  Negative for cough and shortness of breath.   Cardiovascular:  Negative for chest pain, leg swelling and palpitations.  Gastrointestinal:  Negative for abdominal pain, constipation, diarrhea, nausea and vomiting.  Genitourinary:  Negative for bladder incontinence, difficulty urinating, dysuria, frequency, hematuria and nocturia.   Musculoskeletal:  Negative for arthralgias, back pain, flank pain, myalgias and neck pain.  Skin:  Negative for itching and rash.  Neurological:  Negative for dizziness, headaches and numbness.  Hematological:  Does not bruise/bleed easily.  Psychiatric/Behavioral:  Positive for sleep disturbance. Negative for depression and suicidal ideas. The patient is not nervous/anxious.   All other systems reviewed and are negative.    VITALS:   Blood pressure 122/83, pulse 76, temperature (!) 96.6 F (35.9 C), temperature source Tympanic, resp. rate 20, height 5' 9.41" (1.763 m), weight 189 lb 13.1 oz (86.1 kg), SpO2 100%.  Wt Readings from Last 3 Encounters:  09/05/23 189 lb 13.1 oz (86.1 kg)  06/27/22 191 lb 12.8 oz (87 kg)  05/18/22 192 lb 9.6 oz (87.4 kg)    Body mass index is 27.7 kg/m.  Performance status (ECOG): 0 - Asymptomatic  PHYSICAL EXAM:   Physical Exam Vitals and nursing note reviewed. Exam conducted with a chaperone present.  Constitutional:      Appearance: Normal appearance.  Cardiovascular:     Rate and Rhythm: Normal rate and regular rhythm.     Pulses: Normal pulses.     Heart sounds: Normal heart sounds.  Pulmonary:     Effort: Pulmonary effort is normal.     Breath sounds: Normal breath sounds.  Abdominal:     Palpations: Abdomen is soft. There  is no hepatomegaly, splenomegaly or mass.     Tenderness: There is no abdominal tenderness.  Musculoskeletal:     Right lower leg: No edema.     Left lower leg: No edema.  Lymphadenopathy:     Cervical: No cervical adenopathy.     Right cervical: No superficial, deep or posterior cervical adenopathy.    Left cervical: No superficial, deep or posterior cervical adenopathy.     Upper Body:     Right upper body: No supraclavicular or axillary adenopathy.     Left upper body: No supraclavicular or axillary adenopathy.  Neurological:     General: No focal deficit present.     Mental Status: He is alert and oriented to person, place, and time.  Psychiatric:        Mood and Affect: Mood normal.        Behavior: Behavior normal.  Laparoscopic surgical sites are healing well.  LABS:   CBC    Component Value Date/Time   WBC 6.3 05/18/2022 0938   WBC 6.0 02/06/2018 1200   RBC 5.21 05/18/2022 0938   RBC 4.83 02/06/2018 1200   HGB 14.2 05/18/2022 0938   HCT 42.4 05/18/2022 0938   PLT 298 05/18/2022 0938   MCV 81 05/18/2022 0938   MCH 27.3 05/18/2022 0938   MCH 29.8 02/06/2018 1200   MCHC 33.5 05/18/2022 0938   MCHC 34.0 02/06/2018 1200   RDW 12.3 05/18/2022  0938   LYMPHSABS 1.8 05/18/2022 0938   MONOABS 0.6 02/06/2018 1200   EOSABS 0.3 05/18/2022 0938   BASOSABS 0.1 05/18/2022 0938    CMP    Component Value Date/Time   NA 137 05/18/2022 0848   K 4.2 05/18/2022 0848   CL 98 05/18/2022 0848   CO2 22 05/18/2022 0848   GLUCOSE 152 (H) 05/18/2022 0848   GLUCOSE 129 (H) 02/06/2018 1200   BUN 6 05/18/2022 0848   CREATININE 1.13 05/18/2022 0848   CALCIUM 10.3 (H) 05/18/2022 0848   PROT 7.6 05/18/2022 0848   ALBUMIN 4.7 05/18/2022 0848   AST 32 05/18/2022 0848   ALT 32 05/18/2022 0848   ALKPHOS 65 05/18/2022 0848   BILITOT 0.8 05/18/2022 0848   GFRNONAA 88 01/22/2020 0924   GFRAA 102 01/22/2020 0924     No results found for: "CEA1", "CEA" / No results found for: "CEA1",  "CEA" No results found for: "PSA1" No results found for: "ONG295" No results found for: "CAN125"  No results found for: "TOTALPROTELP", "ALBUMINELP", "A1GS", "A2GS", "BETS", "BETA2SER", "GAMS", "MSPIKE", "SPEI" No results found for: "TIBC", "FERRITIN", "IRONPCTSAT" No results found for: "LDH"   STUDIES:   No results found.

## 2023-09-05 ENCOUNTER — Inpatient Hospital Stay

## 2023-09-05 ENCOUNTER — Inpatient Hospital Stay: Attending: Hematology | Admitting: Hematology

## 2023-09-05 ENCOUNTER — Other Ambulatory Visit: Payer: Self-pay

## 2023-09-05 ENCOUNTER — Encounter: Payer: Self-pay | Admitting: Hematology

## 2023-09-05 ENCOUNTER — Other Ambulatory Visit (HOSPITAL_COMMUNITY): Payer: Self-pay | Admitting: Student

## 2023-09-05 VITALS — BP 122/83 | HR 76 | Temp 96.6°F | Resp 20 | Ht 69.41 in | Wt 189.8 lb

## 2023-09-05 DIAGNOSIS — Z79899 Other long term (current) drug therapy: Secondary | ICD-10-CM | POA: Insufficient documentation

## 2023-09-05 DIAGNOSIS — R599 Enlarged lymph nodes, unspecified: Secondary | ICD-10-CM | POA: Insufficient documentation

## 2023-09-05 DIAGNOSIS — K760 Fatty (change of) liver, not elsewhere classified: Secondary | ICD-10-CM | POA: Diagnosis not present

## 2023-09-05 DIAGNOSIS — K625 Hemorrhage of anus and rectum: Secondary | ICD-10-CM | POA: Diagnosis not present

## 2023-09-05 DIAGNOSIS — Z800 Family history of malignant neoplasm of digestive organs: Secondary | ICD-10-CM | POA: Insufficient documentation

## 2023-09-05 DIAGNOSIS — Z841 Family history of disorders of kidney and ureter: Secondary | ICD-10-CM | POA: Insufficient documentation

## 2023-09-05 DIAGNOSIS — C187 Malignant neoplasm of sigmoid colon: Secondary | ICD-10-CM | POA: Insufficient documentation

## 2023-09-05 DIAGNOSIS — Z5111 Encounter for antineoplastic chemotherapy: Secondary | ICD-10-CM | POA: Insufficient documentation

## 2023-09-05 DIAGNOSIS — E119 Type 2 diabetes mellitus without complications: Secondary | ICD-10-CM | POA: Insufficient documentation

## 2023-09-05 DIAGNOSIS — Z8249 Family history of ischemic heart disease and other diseases of the circulatory system: Secondary | ICD-10-CM | POA: Insufficient documentation

## 2023-09-05 DIAGNOSIS — R200 Anesthesia of skin: Secondary | ICD-10-CM | POA: Insufficient documentation

## 2023-09-05 DIAGNOSIS — K5730 Diverticulosis of large intestine without perforation or abscess without bleeding: Secondary | ICD-10-CM | POA: Diagnosis not present

## 2023-09-05 DIAGNOSIS — R911 Solitary pulmonary nodule: Secondary | ICD-10-CM | POA: Insufficient documentation

## 2023-09-05 DIAGNOSIS — Z833 Family history of diabetes mellitus: Secondary | ICD-10-CM | POA: Insufficient documentation

## 2023-09-05 DIAGNOSIS — Z7189 Other specified counseling: Secondary | ICD-10-CM

## 2023-09-05 DIAGNOSIS — C189 Malignant neoplasm of colon, unspecified: Secondary | ICD-10-CM

## 2023-09-05 DIAGNOSIS — Z823 Family history of stroke: Secondary | ICD-10-CM | POA: Insufficient documentation

## 2023-09-05 DIAGNOSIS — F411 Generalized anxiety disorder: Secondary | ICD-10-CM | POA: Diagnosis not present

## 2023-09-05 DIAGNOSIS — Z8261 Family history of arthritis: Secondary | ICD-10-CM | POA: Insufficient documentation

## 2023-09-05 DIAGNOSIS — D508 Other iron deficiency anemias: Secondary | ICD-10-CM

## 2023-09-05 DIAGNOSIS — I10 Essential (primary) hypertension: Secondary | ICD-10-CM | POA: Diagnosis not present

## 2023-09-05 DIAGNOSIS — Z808 Family history of malignant neoplasm of other organs or systems: Secondary | ICD-10-CM | POA: Insufficient documentation

## 2023-09-05 DIAGNOSIS — C19 Malignant neoplasm of rectosigmoid junction: Secondary | ICD-10-CM | POA: Insufficient documentation

## 2023-09-05 LAB — COMPREHENSIVE METABOLIC PANEL WITH GFR
ALT: 39 U/L (ref 0–44)
AST: 39 U/L (ref 15–41)
Albumin: 4 g/dL (ref 3.5–5.0)
Alkaline Phosphatase: 69 U/L (ref 38–126)
Anion gap: 13 (ref 5–15)
BUN: 14 mg/dL (ref 6–20)
CO2: 25 mmol/L (ref 22–32)
Calcium: 10.1 mg/dL (ref 8.9–10.3)
Chloride: 102 mmol/L (ref 98–111)
Creatinine, Ser: 0.94 mg/dL (ref 0.61–1.24)
GFR, Estimated: >60 mL/min (ref >60)
Glucose, Bld: 164 mg/dL — ABNORMAL HIGH (ref 70–99)
Potassium: 4.5 mmol/L (ref 3.5–5.1)
Sodium: 140 mmol/L (ref 135–145)
Total Bilirubin: 0.6 mg/dL (ref 0.0–1.2)
Total Protein: 7.9 g/dL (ref 6.5–8.1)

## 2023-09-05 LAB — IRON AND TIBC
Iron: 133 ug/dL (ref 45–182)
Saturation Ratios: 29 % (ref 17.9–39.5)
TIBC: 463 ug/dL — ABNORMAL HIGH (ref 250–450)
UIBC: 330 ug/dL

## 2023-09-05 LAB — CBC WITH DIFFERENTIAL/PLATELET
Abs Immature Granulocytes: 0.02 10*3/uL (ref 0.00–0.07)
Basophils Absolute: 0.1 10*3/uL (ref 0.0–0.1)
Basophils Relative: 1 %
Eosinophils Absolute: 0.3 10*3/uL (ref 0.0–0.5)
Eosinophils Relative: 3 %
HCT: 39 % (ref 39.0–52.0)
Hemoglobin: 11.6 g/dL — ABNORMAL LOW (ref 13.0–17.0)
Immature Granulocytes: 0 %
Lymphocytes Relative: 17 %
Lymphs Abs: 1.5 10*3/uL (ref 0.7–4.0)
MCH: 23.2 pg — ABNORMAL LOW (ref 26.0–34.0)
MCHC: 29.7 g/dL — ABNORMAL LOW (ref 30.0–36.0)
MCV: 78 fL — ABNORMAL LOW (ref 80.0–100.0)
Monocytes Absolute: 0.5 10*3/uL (ref 0.1–1.0)
Monocytes Relative: 6 %
Neutro Abs: 6.2 10*3/uL (ref 1.7–7.7)
Neutrophils Relative %: 73 %
Platelets: 359 10*3/uL (ref 150–400)
RBC: 5 MIL/uL (ref 4.22–5.81)
RDW: 21.3 % — ABNORMAL HIGH (ref 11.5–15.5)
WBC: 8.6 10*3/uL (ref 4.0–10.5)
nRBC: 0 % (ref 0.0–0.2)

## 2023-09-05 LAB — FERRITIN: Ferritin: 299 ng/mL (ref 24–336)

## 2023-09-05 NOTE — Patient Instructions (Addendum)
 [facility] Chemotherapy Teaching   You will be treated with a chemotherapy regimen called CapeOx - the drugs included in this are capcitabine (Xeloda) and oxaliplatin.  You will take the Xeloda two weeks on and one week off. You will repeat this cycle every 4 weeks.  You will receive the oxaliplatin in the clinic every 21 days at the beginning of each cycle of chemotherapy.  You will see the doctor regularly throughout treatment.  We will obtain blood work from you prior to every treatment and monitor your results to make sure it is safe to give your treatment. The doctor monitors your response to treatment by the way you are feeling, your blood work, and by obtaining scans periodically. There will be wait times while you are here for treatment.  It will take about 30 minutes to 1 hour for your lab work to result.  Then there will be wait times while pharmacy mixes your medications.     Medications you will receive in the clinic prior to your chemotherapy medications:   Aloxi:  ALOXI is used in adults to help prevent the nausea and vomiting that happens with certain chemotherapy drugs.  Aloxi is a long acting medication, and will remain in your system for about 2 days.    Dexamethasone:  This is a steroid given prior to chemotherapy to help prevent allergic reactions; it may also help prevent and control nausea and diarrhea.     Capecitabine (Xeloda)  About This Drug Capecitabine is used to treat cancer. It is given orally (by mouth).  You will take this drug for two weeks in a row, then have a one week break before restarting it again.   Possible Side Effects  Decrease in red blood cells. This may make you feel more tired.   Nausea and throwing up (vomiting)   Pain in your abdomen   Diarrhea (loose bowel movements)   Tiredness and weakness   Increased total bilirubin in your blood. This may mean that you have changes in your liver function.   Hand-foot syndrome. The  palms of your hands or soles of your feet may tingle, become numb, painful, swollen, or red.  Note: Each of the side effects above was reported in 30% or greater of patients treated with capecitabine. Not all possible side effects are included above.  Warnings and Precautions  Abnormal bleeding if you are taking blood thinners such as warfarin - symptoms may be coughing up blood, throwing up blood (may look like coffee grounds), red or black tarry bowel movements, abnormally heavy menstrual flow, nosebleeds or any other unusual bleeding.   Severe diarrhea   Changes in the tissue of the heart and/or heart attack. Some changes may happen that can cause your heart to have less ability to pump blood.   Increased risk of severe side effects if you have a known dihydropyrimidine dehydrogenase deficiency   Dehydration (lack of water in the body from losing too much fluid), which may affect how your kidneys work which can be life-threatening   Severe allergic skin reaction. You may develop blisters on your skin that are filled with fluid or a severe red rash all over your body that may be painful.   Decrease in the number of white blood cells, red blood cells, and platelets. This may raise your risk of infection, make you tired and weak (fatigue), and raise your risk of bleeding.   Patients greater than 89 years of age are at increased  risk of severe and life-threatening side effects.   Increased bilirubin and changes in your liver function, which can cause liver failure  Note: Some of the side effects above are very rare. If you have concerns and/or questions, please discuss them with your medical team.  How to Take Your Medication  Swallow the medicine whole with water within 30 minutes after a meal. Do not break or crush it.   Missed dose: If you vomit or miss a dose, take your next dose at the regular time, and contact your doctor. Do not take 2 doses at the same time and do not double up on  the next dose.   Handling: Wash your hands after handling your medicine, your caretakers should not handle your medicine with bare hands and should wear latex gloves.  This drug may be present in the saliva, tears, sweat, urine, stool, vomit, semen, and vaginal secretions. Talk to your doctor and/or your nurse about the necessary precautions to take during this time.   Storage: Store this medicine in the original container at room temperature. Keep lid tightly closed.   Disposal of unused medicine: Do not flush any expired and/or unused medicine down the toilet or drain unless you are specifically instructed to do so on the medication label. Some facilities have take-back programs and/or other options. If you do not have a take-back program in your area, then please discuss with your nurse or your doctor how to dispose of unused medicine.  Treating Side Effects  Drink plenty of fluids (a minimum of eight glasses per day is recommended).   If you throw up or have loose bowel movements, you should drink more fluids so that you do not become dehydrated (lack of water in the body from losing too much fluid).   If you have diarrhea, eat low-fiber foods that are high in protein and calories and avoid foods that can irritate your digestive tracts or lead to cramping.   Ask your nurse or doctor about medicine that can lessen or stop your diarrhea.   To help with nausea and vomiting, eat small, frequent meals instead of three large meals a day. Choose foods and drinks that are at room temperature. Ask your nurse or doctor about other helpful tips and medicine that is available to help stop or lessen these symptoms.   Manage tiredness by pacing your activities for the day.   Be sure to include periods of rest between energy-draining activities.   To decrease the risk of infection, wash your hands regularly.   Avoid close contact with people who have a cold, the flu, or other infections.   Take your  temperature as your doctor or nurse tells you, and whenever you feel like you may have a fever.   To help decrease the risk of bleeding, use a soft toothbrush. Check with your nurse before using dental floss.   Be very careful when using knives or tools.   Use an electric shaver instead of a razor.   Keeping your pain under control is important to your well-being. Please tell your doctor or nurse if you are experiencing pain.   Avoid sun exposure and apply sunscreen routinely when outdoors.   If you get a rash do not put anything on it unless your doctor or nurse says you may. Keep the area around the rash clean and dry. Ask your doctor for medicine if your rash bothers you.  Food and Drug Interactions  There are no known interactions  of capecitabine with food, however this medication should be taken within 30 minutes after a meal.   Check with your doctor or pharmacist about all other prescription medicines and over-the-counter medicines and dietary supplements (vitamins, minerals, herbs and others) you are taking before starting this medicine as there are known drug interactions with capecitabine. Also, check with your doctor or pharmacist before starting any new prescription or over-the-counter medicines, or dietary supplements to make sure that there are no interactions.    There are known interactions of capecitabine with blood thinning medicine such as warfarin. Ask your doctor what precautions you should take.  When to Call the Doctor  Call your doctor or nurse if you have any of these symptoms and/or any new or unusual symptoms:  Fever of 100.4 F (38 C) or higher   Chills   Trouble breathing   Feeling that your heart is beating in a fast or not normal way (palpitations)   Pain in your chest   Chest pain or symptoms of a heart attack. Most heart attacks involve pain in the center of the chest that lasts more than a few minutes. The pain may go away and come back or it can  be constant. It can feel like pressure, squeezing, fullness, or pain. Sometimes pain is felt in one or both arms, the back, neck, jaw, or stomach. If any of these symptoms last 2 minutes, call 911.   Tiredness that interferes with your daily activities   Feeling dizzy or lightheaded   Easy bleeding or bruising   Blood in your urine, vomit (bright red or coffee-ground) and/or stools (bright red, or black/tarry)   Coughing up blood   Decreased or very dark urine   Nausea that stops you from eating or drinking and/or is not relieved by prescribed medicines   Throwing up   Lasting loss of appetite or rapid weight loss of five pounds in a week   Diarrhea, 4 times in one day or diarrhea with lack of strength or a feeling of being dizzy   Pain that does not go away or is not relieved by prescribed medicines   Signs of possible liver problems: dark urine, pale bowel movements, bad stomach pain, feeling very tired and weak, unusual itching, or yellowing of the eyes or skin   Painful, red, or swollen areas on your hands or feet   Numbness and/or tingling of your hands and/or feet   A new rash or a rash that is not relieved by prescribed medicines   Flu-like symptoms: fever, headache, muscle and joint aches, and fatigue (low energy, feeling weak)   If you think you may be pregnant or may have impregnated your partner  Reproduction Warnings  Pregnancy warning: This drug can have harmful effects on the unborn baby. Women of childbearing potential should use effective methods of birth control during your cancer treatment and for 6 months after treatment. Men with male partners of childbearing potential should use effective methods of birth control during your cancer treatment and for 3 months after your cancer treatment. Let your doctor know right away if you think you may be pregnant or may have impregnated your partner.   Breastfeeding warning: Women should not breastfeed during  treatment and for 2 weeks after treatment because this drug could enter the breast milk and cause harm to a breastfeeding baby.    Fertility warning: In men and women both, this drug may affect your ability to have children in the future. Talk  with your doctor or nurse if you plan to have children. Ask for information on sperm or egg banking.   Oxaliplatin (Eloxatin)  About This Drug Oxaliplatin is used to treat cancer. It is given in the vein (IV) through your port a cath.  It will take 2 hours to infuse.   Possible Side Effects  Bone marrow suppression. This is a decrease in the number of white blood cells, red blood cells, and platelets. This may raise your risk of infection, make you tired and weak (fatigue), and raise your risk of bleeding.   Tiredness   Soreness of the mouth and throat. You may have red areas, white patches, or sores that hurt.   Nausea and vomiting (throwing up)   Diarrhea (loose bowel movements)   Changes in your liver function   Effects on the nerves called peripheral neuropathy. You may feel numbness, tingling, or pain in your hands and feet, and it may be worse in cold temperatures. It may be hard for you to button your clothes, open jars, or walk as usual. You may also have eye pain, jaw spasms, difficulty with swallowing and/or an abnormal feeling of your tongue as well as a heavy feeling in your chest. The effect on the nerves may get worse with more doses of the drug. These effects get better in some people after the drug is stopped but it does not get better in all people.  Note: Each of the side effects above was reported in 40% or greater of patients treated with oxaliplatin. Not all possible side effects are included above.  Warnings and Precautions  Allergic reactions, including anaphylaxis, which may be life-threatening. Signs of allergic reaction to this drug may be swelling of the face, feeling like your tongue or throat are swelling, trouble  breathing, rash, itching, fever, chills, feeling dizzy, and/or feeling that your heart is beating in a fast or not normal way. If this happens, do not take another dose of this drug. You should get urgent medical treatment.   Thickening and/or inflammation (swelling) of the lung tissues, which may be life-threatening. You may have a dry cough or trouble breathing.   This medicine can make you more sensitive to cold, which may cause or worsen nerve problems.   Changes in your central nervous system can happen. The central nervous system is made up of your brain and spinal cord. You could feel extreme tiredness, agitation, confusion, have hallucinations (see or hear things that are not there), trouble understanding or speaking, loss of control of your bowels or bladder, eyesight changes, numbness or lack of strength to your arms, legs, face, or body, seizures or coma. If you start to have any of these symptoms let your doctor know right away.   Severe decrease in white blood cells and platelets when combined with fluorouracil and leucovorin. This may be life-threatening.   Severe changes in your liver function   Abnormal heartbeat and/or EKG, which can be life-threatening   Rhabdomyolysis- damage to your muscles which may release proteins in your blood and affect how your kidneys work, which may be life-threatening. You may have severe muscle weakness and/or pain, or dark urine.   Abnormal bleeding - symptoms may be coughing up blood, throwing up blood (may look like coffee grounds), red or black tarry bowel movements, abnormally heavy menstrual flow, nosebleeds, or any other unusual bleeding.  Note: Some of the side effects above are very rare. If you have concerns and/or questions, please discuss them  with your medical team.  Important Information  This drug may impair your ability to drive or use machinery. Talk to your doctor and/or nurse about precautions you may need to take.  This drug may  be present in the saliva, tears, sweat, urine, stool, vomit, semen, and vaginal secretions. Talk to your doctor and/or your nurse about the necessary precautions to take during this time.  Treating Side Effects  Manage tiredness by pacing your activities for the day.   Be sure to include periods of rest between energy-draining activities.   To decrease the risk of infection, wash your hands regularly.   Avoid close contact with people who have a cold, the flu, or other infections.   Take your temperature as your doctor or nurse tells you, and whenever you feel like you may have a fever.   To help decrease the risk of bleeding, use a soft toothbrush. Check with your nurse before using dental floss.   Be very careful when using knives or tools.   Use an electric shaver instead of a razor.   Drink plenty of fluids (a minimum of eight glasses per day is recommended).   Mouth care is very important. Your mouth care should consist of routine, gentle cleaning of your teeth or dentures and rinsing your mouth with a mixture of 1/2 teaspoon of salt in 8 ounces of water or 1/2 teaspoon of baking soda in 8 ounces of water. This should be done at least after each meal and at bedtime.   If you have mouth sores, avoid mouthwash that has alcohol. Also avoid alcohol and smoking because they can bother your mouth and throat.   To help with nausea and vomiting, eat small, frequent meals instead of three large meals a day. Choose foods and drinks that are at room temperature. Ask your nurse or doctor about other helpful tips and medicine that is available to help stop or lessen these symptoms.   If you throw up or have loose bowel movements, you should drink more fluids so that you do not become dehydrated (lack of water in the body from losing too much fluid).   If you have diarrhea, eat low-fiber foods that are high in protein and calories and avoid foods that can irritate your digestive tracts or lead to  cramping.   Ask your nurse or doctor about medicine that can lessen or stop your diarrhea.   If you have numbness and tingling in your hands and feet, be careful when cooking, walking, and handling sharp objects and hot liquids.   Do not drink cold drinks or use ice. Cover exposed skin before coming in contact with cold temperatures or cold objects. When out in cold weather wear warm clothing and cover your mouth and nose to warm the air that goes into your lungs. Tell your doctor if you get sensitive to the cold.  Food and Drug Interactions  There are no known interactions of oxaliplatin with food.   Check with your doctor or pharmacist about all other prescription medicines and over-the-counter medicines and dietary supplements (vitamins, minerals, herbs, and others) you are taking before starting this medicine as there are known drug interactions with oxaliplatin. Also, check with your doctor or pharmacist before starting any new prescription or over-the-counter medicines, or dietary supplements to make sure that there are no interactions.   There are known interactions of oxaliplatin when given in combination with fluorouracil and leucovorin and with blood thinning medicine such as warfarin. Ask  your doctor what precautions you should take.  When to Call the Doctor  Call your doctor or nurse if you have any of these symptoms and/or any new or unusual symptoms:  Fever of 100.4 F (38 C) or higher   Chills   Tiredness that interferes with your daily activities   Feeling dizzy or lightheaded   Easy bleeding or bruising   Extreme tiredness, agitation, or confusion   Symptoms of a seizure such as confusion, blacking out, passing out, loss of hearing or vision, blurred vision, unusual smells or tastes (such as burning rubber), trouble talking, tremors or shaking in parts or all of the body, repeated body movements, tense muscles that do not relax, and loss of control of urine and bowels.  If you or your family member suspects you are having a seizure, call 911 right away.   Hallucinations   Trouble understanding or speaking   Loss of control of bowels or bladder   Blurry vision or changes in your eyesight such as pain in your eyes   Numbness or lack of strength to your arms, legs, face, or body   Feeling that your heart is beating in a fast or not normal way (palpitations)   Pain or heavy feeling in your chest   Dry cough or coughing up blood   Trouble breathing   Difficulty swallowing or an abnormal feeling of your tongue   Pain in your mouth or throat that makes it hard to eat or drink   Nausea that stops you from eating or drinking and/or is not relieved by prescribed medicines   Throwing up   Diarrhea, 4 times in one day or diarrhea with lack of strength or a feeling of being dizzy   Numbness, tingling, or pain in your hands and feet   Signs of possible liver problems: dark urine, pale bowel movements, bad stomach pain, feeling very tired and weak, unusual itching, or yellowing of the eyes or skin   Blood in your urine, vomit (bright red or coffee-ground) and/or stools (bright red, or black/tarry)   Signs of rhabdomyolysis: decreased urine, very dark urine, muscle pain in the shoulders, thighs, or lower back; muscle weakness or trouble moving arms and legs   Signs of allergic reaction: swelling of the face, feeling like your tongue or throat are swelling, trouble breathing, rash, itching, fever, chills, feeling dizzy, and/or feeling that your heart is beating in a fast or not normal way. If this happens, call 911 for emergency care.   If you think you may be pregnant or may have impregnated your partner  Reproduction Warnings  Pregnancy warning: This drug may have harmful effects on the unborn baby. Women of childbearing potential should use effective methods of birth control during your cancer treatment and for 9 months after your treatment. Men with  male partners of childbearing potential should use effective methods of birth control during your cancer treatment and for 6 months after your cancer treatment. Let your doctor know right away if you think you may be pregnant or may have impregnated your partner.   Breastfeeding warning: It is not known if this drug passes into breast milk. For this reason, women should not breastfeed during treatment and for 3 months after treatment because this drug could enter the breast milk and cause harm to a breastfeeding baby..   Fertility warning: In men and women both, this drug may affect your ability to have children in the future. Talk with your doctor or  nurse if you plan to have children. Ask for information on sperm or egg banking.   SELF CARE ACTIVITIES WHILE RECEIVING CHEMOTHERAPY:  Hydration Increase your fluid intake 48 hours prior to treatment and drink at least 8 to 12 cups (64 ounces) of water/decaffeinated beverages per day after treatment. You can still have your cup of coffee or soda but these beverages do not count as part of your 8 to 12 cups that you need to drink daily. No alcohol intake.  Medications Continue taking your normal prescription medication as prescribed.  If you start any new herbal or new supplements please let us  know first to make sure it is safe.  Mouth Care Have teeth cleaned professionally before starting treatment. Keep dentures and partial plates clean. Use soft toothbrush and do not use mouthwashes that contain alcohol. Biotene is a good mouthwash that is available at most pharmacies or may be ordered by calling [phone]. Use warm salt water gargles (1 teaspoon salt per 1 quart warm water) before and after meals and at bedtime. If you need dental work, please let the doctor know before you go for your appointment so that we can coordinate the best possible time for you in regards to your chemo regimen. You need to also let your dentist know that you are  actively taking chemo. We may need to do labs prior to your dental appointment.  Skin Care Always use sunscreen that has not expired and with SPF (Sun Protection Factor) of 50 or higher. Wear hats to protect your head from the sun. Remember to use sunscreen on your hands, ears, face, & feet.  Use good moisturizing lotions such as udder cream, eucerin, or even Vaseline. Some chemotherapies can cause dry skin, color changes in your skin and nails.    Avoid long, hot showers or baths. Use gentle, fragrance-free soaps and laundry detergent. Use moisturizers, preferably creams or ointments rather than lotions because the thicker consistency is better at preventing skin dehydration. Apply the cream or ointment within 15 minutes of showering. Reapply moisturizer at night, and moisturize your hands every time after you wash them.  Hair Loss (if your doctor says your hair will fall out)  If your doctor says that your hair is likely to fall out, decide before you begin chemo whether you want to wear a wig. You may want to shop before treatment to match your hair color. Hats, turbans, and scarves can also camouflage hair loss, although some people prefer to leave their heads uncovered. If you go bare-headed outdoors, be sure to use sunscreen on your scalp. Cut your hair short. It eases the inconvenience of shedding lots of hair, but it also can reduce the emotional impact of watching your hair fall out. Don't perm or color your hair during chemotherapy. Those chemical treatments are already damaging to hair and can enhance hair loss. Once your chemo treatments are done and your hair has grown back, it's OK to resume dyeing or perming hair.  With chemotherapy, hair loss is almost always temporary. But when it grows back, it may be a different color or texture. In older adults who still had hair color before chemotherapy, the new growth may be completely gray.  Often, new hair is very fine and soft.  Infection  Prevention Please wash your hands for at least 30 seconds using warm soapy water. Handwashing is the #1 way to prevent the spread of germs. Stay away from sick people or people who are getting over  a cold. If you develop respiratory systems such as green/yellow mucus production or productive cough or persistent cough let us  know and we will see if you need an antibiotic. It is a good idea to keep a pair of gloves on when going into grocery stores/[employer] to decrease your risk of coming into contact with germs on the carts, etc. Carry alcohol hand gel with you at all times and use it frequently if out in public. If your temperature reaches 100.5 or higher please call the clinic and let us  know.  If it is after hours or on the weekend please go to the ER if your temperature is over 100.5.  Please have your own personal thermometer at home to use.    Sex and bodily fluids If you are going to have sex, a condom must be used to protect the person that isn't taking chemotherapy. Chemo can decrease your libido (sex drive). For a few days after chemotherapy, chemotherapy can be excreted through your bodily fluids.  When using the toilet please close the lid and flush the toilet twice.  Do this for a few day after you have had chemotherapy.   Effects of chemotherapy on your sex life Some changes are simple and won't last long. They won't affect your sex life permanently.  Sometimes you may feel: too tired not strong enough to be very active sick or sore  not in the mood anxious or low Your anxiety might not seem related to sex. For example, you may be worried about the cancer and how your treatment is going. Or you may be worried about money, or about how you family are coping with your illness.  These things can cause stress, which can affect your interest in sex. It's important to talk to your partner about how you feel.  Remember - the changes to your sex life don't usually last long. There's usually no  medical reason to stop having sex during chemo. The drugs won't have any long term physical effects on your performance or enjoyment of sex. Cancer can't be passed on to your partner during sex  Contraception It's important to use reliable contraception during treatment. Avoid getting pregnant while you or your partner are having chemotherapy. This is because the drugs may harm the baby. Sometimes chemotherapy drugs can leave a man or woman infertile.  This means you would not be able to have children in the future. You might want to talk to someone about permanent infertility. It can be very difficult to learn that you may no longer be able to have children. Some people find counselling helpful. There might be ways to preserve your fertility, although this is easier for men than for women. You may want to speak to a fertility expert. You can talk about sperm banking or harvesting your eggs. You can also ask about other fertility options, such as donor eggs. If you have or have had breast cancer, your doctor might advise you not to take the contraceptive pill. This is because the hormones in it might affect the cancer. It is not known for sure whether or not chemotherapy drugs can be passed on through semen or secretions from the vagina. Because of this some doctors advise people to use a barrier method if you have sex during treatment. This applies to vaginal, anal or oral sex. Generally, doctors advise a barrier method only for the time you are actually having the treatment and for about a week after your treatment. Advice  like this can be worrying, but this does not mean that you have to avoid being intimate with your partner. You can still have close contact with your partner and continue to enjoy sex.  Animals If you have cats or birds we just ask that you not change the litter or change the cage.  Please have someone else do this for you while you are on chemotherapy.   Food Safety During and After  Cancer Treatment Food safety is important for people both during and after cancer treatment. Cancer and cancer treatments, such as chemotherapy, radiation therapy, and stem cell/bone marrow transplantation, often weaken the immune system. This makes it harder for your body to protect itself from foodborne illness, also called food poisoning. Foodborne illness is caused by eating food that contains harmful bacteria, parasites, or viruses.  Foods to avoid Some foods have a higher risk of becoming tainted with bacteria. These include: Unwashed fresh fruit and vegetables, especially leafy vegetables that can hide dirt and other contaminants Raw sprouts, such as alfalfa sprouts Raw or undercooked beef, especially ground beef, or other raw or undercooked meat and poultry Fatty, fried, or spicy foods immediately before or after treatment.  These can sit heavy on your stomach and make you feel nauseous. Raw or undercooked shellfish, such as oysters. Sushi and sashimi, which often contain raw fish.  Unpasteurized beverages, such as unpasteurized fruit juices, raw milk, raw yogurt, or cider Undercooked eggs, such as soft boiled, over easy, and poached; raw, unpasteurized eggs; or foods made with raw egg, such as homemade raw cookie dough and homemade mayonnaise  Simple steps for food safety  Shop smart. Do not buy food stored or displayed in an unclean area. Do not buy bruised or damaged fruits or vegetables. Do not buy cans that have cracks, dents, or bulges. Pick up foods that can spoil at the end of your shopping trip and store them in a cooler on the way home.  Prepare and clean up foods carefully. Rinse all fresh fruits and vegetables under running water, and dry them with a clean towel or paper towel. Clean the top of cans before opening them. After preparing food, wash your hands for 20 seconds with hot water and soap. Pay special attention to areas between fingers and under nails. Clean your  utensils and dishes with hot water and soap. Disinfect your kitchen and cutting boards using 1 teaspoon of liquid, unscented bleach mixed into 1 quart of water.    Dispose of old food. Eat canned and packaged food before its expiration date (the "use by" or "best before" date). Consume refrigerated leftovers within 3 to 4 days. After that time, throw out the food. Even if the food does not smell or look spoiled, it still may be unsafe. Some bacteria, such as Listeria, can grow even on foods stored in the refrigerator if they are kept for too long.  Take precautions when eating out. At restaurants, avoid buffets and salad bars where food sits out for a long time and comes in contact with many people. Food can become contaminated when someone with a virus, often a norovirus, or another "bug" handles it. Put any leftover food in a "to-go" container yourself, rather than having the server do it. And, refrigerate leftovers as soon as you get home. Choose restaurants that are clean and that are willing to prepare your food as you order it cooked.   AT HOME MEDICATIONS:  Compazine/Prochlorperazine 10mg  tablet. Take 1 tablet every 6 hours as needed for nausea/vomiting. (This can make you sleepy)   EMLA cream. Apply a quarter size amount to port site 1 hour prior to chemo. Do not rub in. Cover with plastic wrap.    Diarrhea Sheet   If you are having loose stools/diarrhea, please purchase Imodium and begin taking as outlined:  At the first sign of poorly formed or loose stools you should begin taking Imodium (loperamide) 2 mg capsules.  Take two tablets (4mg ) followed by one tablet (2mg ) every 2 hours - DO NOT EXCEED 8 tablets in 24 hours.  If it is bedtime and you are having loose stools, take 2 tablets at bedtime, then 2 tablets every 4 hours  until morning.   Always call the Cancer Center if you are having loose stools/diarrhea that you can't get under control.  Loose stools/diarrhea leads to dehydration (loss of water) in your body.  We have other options of trying to get the loose stools/diarrhea to stop but you must let us know!   Constipation Sheet  Colace - 100 mg capsules - take 2 capsules daily.  If this doesn't help then you can increase to 2 capsules twice daily.  Please call if the above does not work for you. Do not go more than 2 days without a bowel movement.  It is very important that you do not become constipated.  It will make you feel sick to your stomach (nausea) and can cause abdominal pain and vomiting.  Nausea Sheet   Compazine/Prochlorperazine 10mg  tablet. Take 1 tablet every 6 hours as needed for nausea/vomiting (This can make you drowsy).  If you are having persistent nausea (nausea that does not stop) please call the Cancer Center and let us know the amount of nausea that you are experiencing.  If you begin to vomit, you need to call the Cancer Center and if it is the weekend and you have vomited more than one time and can't get it to stop-go to the Emergency Room.  Persistent nausea/vomiting can lead to dehydration (loss of fluid in your body) and will make you feel very weak and unwell. Ice chips, sips of clear liquids, foods that are at room temperature, crackers, and toast tend to be better tolerated.   SYMPTOMS TO REPORT AS SOON AS POSSIBLE AFTER TREATMENT:  FEVER GREATER THAN 100.5 F  CHILLS WITH OR WITHOUT FEVER  NAUSEA AND VOMITING THAT IS NOT CONTROLLED WITH YOUR NAUSEA MEDICATION  UNUSUAL SHORTNESS OF BREATH  UNUSUAL BRUISING OR BLEEDING  TENDERNESS IN MOUTH AND THROAT WITH OR WITHOUT   PRESENCE OF ULCERS  URINARY PROBLEMS  BOWEL PROBLEMS  UNUSUAL RASH      Wear comfortable clothing and clothing appropriate for easy access to any Portacath or PICC line. Let us know if there is  anything that we can do to make your therapy better!    What to do if you need assistance after hours or on the weekends: CALL [phone].  HOLD on the line, do not hang up.  You will hear multiple messages but at the end you will be connected with a nurse triage line.  They will contact the doctor if necessary.  Most of the time they will be able to assist you.  Do not call the hospital operator.      I have been informed and understand all of the instructions given to me and have received a copy. I have been instructed to call  the clinic [phone] or my family physician as soon as possible for continued medical care, if indicated. I do not have any more questions at this time but understand that I may call the Cancer Center or the Patient Navigator at [phone] during office hours should I have questions or need assistance in obtaining follow-up care.

## 2023-09-05 NOTE — Patient Instructions (Addendum)
 You were seen and examined today by Dr. Ellin Saba. Dr. Ellin Saba is a medical oncologist, meaning that he specializes in the treatment of cancer diagnoses. Dr. Ellin Saba discussed your past medical history, family history of cancers, and the events that led to you being here today.  He reviewed the results of your biopsy which is showing you have a Stage III colon cancer. This will require treatment with chemotherapy to prevent the cancer from coming back.   He discussed with you a treatment called CAPEOX. This regimen consists of two chemotherapy drugs. One of the chemotherapy drugs is given in the clinic every 3 weeks. The other drug is a pill you take at home. The chemo infusion is oxaliplatin. The pill is called Xeloda.     You will need a port a cath placed to administer the chemotherapy. This will be done in [place] by an interventional radiologist.   We will arrange for you to have an education class with our nurse educator to go over treatment and side effects in detail. She will also provide you with information on how to deal with side effects should they arise.   We will also make a referral to radiation oncology for evaluation. Since your tumor was located in the junction of your sigmoid colon and rectum. Dr. Kirtland Bouchard will consult with your gastroenterologist and the pathologist to confirm the location of the tumor. If they say it is in the rectum this will require some radiation.   Return as scheduled.

## 2023-09-06 ENCOUNTER — Other Ambulatory Visit: Payer: Self-pay | Admitting: Radiation Oncology

## 2023-09-06 ENCOUNTER — Inpatient Hospital Stay
Admission: RE | Admit: 2023-09-06 | Discharge: 2023-09-06 | Disposition: A | Discharge status: Home / Self Care | Payer: Self-pay | Source: Ambulatory Visit | Attending: Radiation Oncology | Admitting: Radiation Oncology

## 2023-09-06 ENCOUNTER — Telehealth: Payer: Self-pay | Admitting: Radiation Oncology

## 2023-09-06 DIAGNOSIS — C19 Malignant neoplasm of rectosigmoid junction: Secondary | ICD-10-CM

## 2023-09-06 LAB — CEA: CEA: 10.6 ng/mL — ABNORMAL HIGH (ref 0.0–4.7)

## 2023-09-06 NOTE — H&P (Signed)
 Chief Complaint: Patient was seen in consultation today for sigmoid colon adneocarcinoma, with consideration for Port-A-Cath placement.  Referring Provider(s): Dr. Doreatha Massed, MD   Supervising Physician: Richarda Overlie  Patient Status: [facility] - Out-pt  Patient is Full Code  History of Present Illness: Randall Murphy is a 43 y.o. male  with PMHx notable for HTN, HLD, elevated LFTs, fatty liver, T2DM, ADD, and anxiety.  Per Dr. Marice Potter progress note on 4/9: "Sigmoid colon adenocarcinoma: - Presentation: Rectal bleeding, seen by GI Dr. Lenore Manner - Colonoscopy (07/19/2023): Mild pancolonic diverticulosis.  A single sessile 1 cm nonbleeding polyp of benign appearance found in the transverse colon.  An ulcerated bleeding 8 cm mass of malignant appearance was found in the sigmoid colon.  Mass caused partial obstruction. - CEA (07/20/2023): 39.4 - CT CAP (07/25/2023): Mass within the sigmoid colon.  Few mildly enlarged lymph nodes adjacent to the sigmoid colon.  Tiny left upper lobe pulmonary nodule, indeterminate. - S/p robotic LAR on 08/23/2023 by Dr. Ulysees Barns [facility] health) - Pathology: Invasive colonic adenocarcinoma, moderately differentiated arising in a tubulovillous adenoma, tumor size 6.8 x 5.6 cm.  Tumor location: Distal sigmoid/rectosigmoid colon.  1/31 lymph nodes positive.  All margins negative.  LVI-not identified.  PNI-present.  pT3 pN1a. - Cancer next expanded +RNA insight panel [facility]): Negative  Stage III (T3 N1) sigmoid colon adenocarcinoma: - We reviewed pathology reports, imaging studies with the patient and his wife in detail. - Colonoscopy report states that his mass is in the sigmoid colon.  However OR report and pathology report stated the tumor location being in the distal sigmoid/rectosigmoid colon. - I will talk to Dr. Ulysees Barns and pathologist at [facility] regarding clarification of the site of the tumor. - If it is sigmoid colon cancer, we can proceed  with 3 months of CapeOx based on IDEA analysis.  We talked about need for port placement. - If the tumor location is rectosigmoid, recommend 4 months of chemotherapy with chemoradiation. [...]"   Interventional Radiology was requested for Port-A-Cath placement. Patient is scheduled for same in IR today.  All labs and medications are within acceptable parameters. No pertinent allergies. Patient has been NPO since midnight.    Patient is currently without any significant complaints. Patient is alert and laying in bed, calm. Patient denies any fevers, headache, chest pain, SOB, cough, abdominal pain, nausea, vomiting or bleeding.     Past Medical History:  Diagnosis Date   ADD (attention deficit disorder) 02/22/2015   Anxiety, generalized 02/15/2016   Diabetes mellitus without complication (HCC)    Dyslipidemia 02/22/2015   Elevated liver enzymes    Fatty liver 03/23/2015   Hypertension     No past surgical history on file.  Allergies: Patient has no known allergies.  Medications: Prior to Admission medications   Medication Sig Start Date End Date Taking? Authorizing Provider  Continuous Blood Gluc Sensor (FREESTYLE LIBRE 3 SENSOR) MISC 1 Device by Does not apply route every 14 (fourteen) days. Place 1 sensor on the skin every 14 days. Use to check glucose continuously 02/14/22   Gwenlyn Fudge, FNP  escitalopram (LEXAPRO) 10 MG tablet Take 1 tablet (10 mg total) by mouth daily. (NEEDS TO BE SEEN BEFORE NEXT REFILL) 10/30/22   Rakes, Doralee Albino, FNP  lisinopril (ZESTRIL) 10 MG tablet Take 1 tablet (10 mg total) by mouth daily. 05/18/22   Sonny Masters, FNP  oxyCODONE (OXY IR/ROXICODONE) 5 MG immediate release tablet Take 5 mg by  mouth every 4 (four) hours as needed. 08/25/23   [provider]  pravastatin (PRAVACHOL) 40 MG tablet Take 1 tablet (40 mg total) by mouth daily. (NEEDS TO BE SEEN BEFORE NEXT REFILL) 10/30/22   Rakes, Doralee Albino, FNP  Semaglutide, 2 MG/DOSE, (OZEMPIC, 2  MG/DOSE,) 8 MG/3ML SOPN Inject 2 mg into the skin once a week. (NEEDS TO BE SEEN BEFORE NEXT REFILL) 10/27/22   Rakes, Doralee Albino, FNP  VYVANSE 40 MG capsule Take by mouth. 06/08/22   [provider]     Family History  Problem Relation Age of Onset   Arthritis Mother    Diabetes Father    Hypertension Father    Stroke Father    Kidney disease Father        ESRD on dialysis   Diabetes Sister    Hypertension Brother    Diabetes Maternal Grandmother    Brain cancer Maternal Grandfather    Heart attack Paternal Grandfather     Social History   Socioeconomic History   Marital status: Married    Spouse name: Not on file   Number of children: 2   Years of education: Not on file   Highest education level: Not on file  Occupational History   Occupation: IT  Tobacco Use   Smoking status: Never   Smokeless tobacco: Never  Vaping Use   Vaping status: Never Used  Substance and Sexual Activity   Alcohol use: Yes    Alcohol/week: 4.0 standard drinks of alcohol    Types: 4 Cans of beer per week    Comment: daily   Drug use: Never   Sexual activity: Yes    Birth control/protection: Condom  Other Topics Concern   Not on file  Social History Narrative   Not on file   Social Drivers of Health   Financial Resource Strain: Low Risk  (07/20/2023)   Received from [facility]   Overall Financial Resource Strain (CARDIA)    Difficulty of Paying Living Expenses: Not very hard  Food Insecurity: No Food Insecurity (08/23/2023)   Received from [facility]   Hunger Vital Sign    Worried About Running Out of Food in the Last Year: Never true    Ran Out of Food in the Last Year: Never true  Transportation Needs: No Transportation Needs (08/23/2023)   Received from [facility] - Transportation    Lack of Transportation (Medical): No    Lack of Transportation (Non-Medical): No  Physical Activity: Insufficiently Active (07/20/2023)   Received from [facility]    Exercise Vital Sign    Days of Exercise per Week: 2 days    Minutes of Exercise per Session: 20 min  Stress: No Stress Concern Present (08/23/2023)   Received from [facility] - Occupational Stress Questionnaire    Feeling of Stress : Not at all  Social Connections: Moderately Integrated (07/20/2023)   Received from [facility]   Social Network    How would you rate your social network (family, work, friends)?: Adequate participation with social networks     Review of Systems: A 12 point ROS discussed and pertinent positives are indicated in the HPI above.  All other systems are negative.  Vital Signs: BP 126/88   Pulse 78   Temp 98.1 F (36.7 C) (Oral)   Resp 18   Ht 5\' 9"  (1.753 m)   Wt 189 lb (85.7 kg)   SpO2 100%  BMI 27.91 kg/m   Advance Care Plan: The advanced care place/surrogate decision maker was discussed at the time of visit and the patient did not wish to discuss or was not able to name a surrogate decision maker or provide an advance care plan.  Physical Exam Vitals reviewed.  Constitutional:      General: He is not in acute distress.    Appearance: Normal appearance.  HENT:     Mouth/Throat:     Mouth: Mucous membranes are moist.  Cardiovascular:     Rate and Rhythm: Normal rate and regular rhythm.     Pulses: Normal pulses.     Heart sounds: No murmur heard. Pulmonary:     Effort: Pulmonary effort is normal. No respiratory distress.     Breath sounds: Normal breath sounds.  Abdominal:     General: Abdomen is flat.     Tenderness: There is no abdominal tenderness.  Musculoskeletal:        General: Normal range of motion.     Cervical back: Normal range of motion.  Skin:    General: Skin is warm and dry.  Neurological:     Mental Status: He is alert and oriented to person, place, and time.  Psychiatric:        Mood and Affect: Mood normal.        Behavior: Behavior normal.        Thought Content:  Thought content normal.        Judgment: Judgment normal.     Imaging: No results found.  Labs:  CBC: Recent Labs    09/05/23 0921  WBC 8.6  HGB 11.6*  HCT 39.0  PLT 359    COAGS: No results for input(s): "INR", "APTT" in the last 8760 hours.  BMP: Recent Labs    09/05/23 0921  NA 140  K 4.5  CL 102  CO2 25  GLUCOSE 164*  BUN 14  CALCIUM 10.1  CREATININE 0.94  GFRNONAA >60    LIVER FUNCTION TESTS: Recent Labs    09/05/23 0921  BILITOT 0.6  AST 39  ALT 39  ALKPHOS 69  PROT 7.9  ALBUMIN 4.0    TUMOR MARKERS: No results for input(s): "AFPTM", "CEA", "CA199", "CHROMGRNA" in the last 8760 hours.  Assessment and Plan: Patient is recently diagnosed with sigmoid colon adenocarcinoma, and is folowed by Dr. Ellin Saba. Plan to initiate chemoradiation.   Patient presents for scheduled Port-A-Cath placement in IR today.  Risks and benefits of image guided port-a-catheter placement was discussed with the patient including, but not limited to bleeding, infection, pneumothorax, or fibrin sheath development and need for additional procedures.  All of the patient's questions were answered, patient is agreeable to proceed.  Consent signed and in chart.      Thank you for allowing our service to participate in Randall Murphy 's care.  Electronically Signed: Sable Feil, PA-C   09/07/2023, 8:10 AM      I spent a total of 30 Minutes in face to face in clinical consultation, greater than 50% of which was counseling/coordinating care for sigmoid colon adneocarcinoma, with consideration for Port-A-Cath placement.

## 2023-09-06 NOTE — Telephone Encounter (Signed)
 LVM to schedule urgent CON with Dr. Mitzi Hansen at the request of Dr. Ellin Saba.

## 2023-09-07 ENCOUNTER — Encounter (HOSPITAL_COMMUNITY): Payer: Self-pay

## 2023-09-07 ENCOUNTER — Ambulatory Visit
Admission: RE | Admit: 2023-09-07 | Discharge: 2023-09-07 | Disposition: A | Discharge status: Home / Self Care | Source: Ambulatory Visit | Attending: Radiation Oncology | Admitting: Radiation Oncology

## 2023-09-07 ENCOUNTER — Ambulatory Visit: Admitting: Radiation Oncology

## 2023-09-07 ENCOUNTER — Ambulatory Visit

## 2023-09-07 ENCOUNTER — Ambulatory Visit (HOSPITAL_COMMUNITY)
Admission: RE | Admit: 2023-09-07 | Discharge: 2023-09-07 | Disposition: A | Discharge status: Home / Self Care | Source: Ambulatory Visit | Attending: Hematology | Admitting: Hematology

## 2023-09-07 ENCOUNTER — Other Ambulatory Visit: Payer: Self-pay

## 2023-09-07 VITALS — BP 130/94 | HR 83 | Temp 97.7°F | Resp 18 | Ht 69.0 in | Wt 190.1 lb

## 2023-09-07 DIAGNOSIS — C189 Malignant neoplasm of colon, unspecified: Secondary | ICD-10-CM | POA: Insufficient documentation

## 2023-09-07 DIAGNOSIS — R599 Enlarged lymph nodes, unspecified: Secondary | ICD-10-CM | POA: Insufficient documentation

## 2023-09-07 DIAGNOSIS — I10 Essential (primary) hypertension: Secondary | ICD-10-CM | POA: Diagnosis not present

## 2023-09-07 DIAGNOSIS — Z7985 Long-term (current) use of injectable non-insulin antidiabetic drugs: Secondary | ICD-10-CM | POA: Insufficient documentation

## 2023-09-07 DIAGNOSIS — E785 Hyperlipidemia, unspecified: Secondary | ICD-10-CM | POA: Insufficient documentation

## 2023-09-07 DIAGNOSIS — C19 Malignant neoplasm of rectosigmoid junction: Secondary | ICD-10-CM | POA: Insufficient documentation

## 2023-09-07 DIAGNOSIS — K760 Fatty (change of) liver, not elsewhere classified: Secondary | ICD-10-CM | POA: Insufficient documentation

## 2023-09-07 DIAGNOSIS — Z79899 Other long term (current) drug therapy: Secondary | ICD-10-CM | POA: Insufficient documentation

## 2023-09-07 DIAGNOSIS — E119 Type 2 diabetes mellitus without complications: Secondary | ICD-10-CM | POA: Insufficient documentation

## 2023-09-07 DIAGNOSIS — R911 Solitary pulmonary nodule: Secondary | ICD-10-CM | POA: Insufficient documentation

## 2023-09-07 HISTORY — PX: IR IMAGING GUIDED PORT INSERTION: IMG5740

## 2023-09-07 LAB — GLUCOSE, CAPILLARY: Glucose-Capillary: 159 mg/dL — ABNORMAL HIGH (ref 70–99)

## 2023-09-07 MED ORDER — LIDOCAINE HCL 1 % IJ SOLN
INTRAMUSCULAR | Status: AC
Start: 2023-09-07 — End: ?
  Filled 2023-09-07: qty 20

## 2023-09-07 MED ORDER — LIDOCAINE-EPINEPHRINE 1 %-1:100000 IJ SOLN
INTRAMUSCULAR | Status: AC
Start: 2023-09-07 — End: ?
  Filled 2023-09-07: qty 1

## 2023-09-07 MED ORDER — LIDOCAINE HCL 1 % IJ SOLN
20.0000 mL | Freq: Once | INTRAMUSCULAR | Status: AC
  Administered 2023-09-07: 10 mL

## 2023-09-07 MED ORDER — FENTANYL CITRATE (PF) 100 MCG/2ML IJ SOLN
INTRAMUSCULAR | Status: AC | PRN
  Administered 2023-09-07 (×2): 50 ug via INTRAVENOUS

## 2023-09-07 MED ORDER — MIDAZOLAM HCL 2 MG/2ML IJ SOLN
INTRAMUSCULAR | Status: AC | PRN
  Administered 2023-09-07 (×2): 1 mg via INTRAVENOUS

## 2023-09-07 MED ORDER — FENTANYL CITRATE (PF) 100 MCG/2ML IJ SOLN
INTRAMUSCULAR | Status: AC
Start: 2023-09-07 — End: ?
  Filled 2023-09-07: qty 2

## 2023-09-07 MED ORDER — HEPARIN SOD (PORK) LOCK FLUSH 100 UNIT/ML IV SOLN
INTRAVENOUS | Status: AC
  Filled 2023-09-07: qty 5

## 2023-09-07 MED ORDER — LIDOCAINE-EPINEPHRINE 1 %-1:100000 IJ SOLN
20.0000 mL | Freq: Once | INTRAMUSCULAR | Status: AC
  Administered 2023-09-07: 10 mL via INTRADERMAL

## 2023-09-07 MED ORDER — MIDAZOLAM HCL 2 MG/2ML IJ SOLN
INTRAMUSCULAR | Status: AC
  Filled 2023-09-07: qty 2

## 2023-09-07 MED ORDER — HEPARIN SOD (PORK) LOCK FLUSH 100 UNIT/ML IV SOLN
500.0000 [IU] | Freq: Once | INTRAVENOUS | Status: AC
  Administered 2023-09-07: 500 [IU] via INTRAVENOUS

## 2023-09-07 NOTE — Progress Notes (Signed)
 Radiation Oncology         [phone]) [phone] ________________________________  Name: Randall Murphy        MRN: [id number]  Date of Service: 09/07/2023 DOB: [birth year]  [id number], Meredith Mody, MD  Doreatha Massed, MD     REFERRING PHYSICIAN: Doreatha Massed, MD   DIAGNOSIS: The encounter diagnosis was Malignant neoplasm of rectosigmoid junction (HCC).  Stage III (T3, N1) rectosigmoid colon adenocarcinoma; s/p LAR resection on 08/23/2023 with negative margins and 1/31 nodes positive for carcinoma  HISTORY OF PRESENT ILLNESS: Randall Murphy is a 43 y.o. male seen at the request of Dr. Ellin Saba.  Patient originally presented with rectal and underwent colonoscopy on 07/19/2023 which demonstrated an ulcerated bleeding 8 cm mass of malignant appearance in the sigmoid colon causing partial obstruction; a single sessile 1 cm nonbleeding polyp of benign appearance in the transverse colon; and mild pancolonic diverticular cysts.  CEA obtained on 07/20/2023 was 39.4.  CT On 07/25/2023 demonstrated the previously visualized mass within the sigmoid colon; few mildly enlarged lymph nodes adjacent to the sigmoid colon; and a tiny left upper lobe pulmonary nodule.  Subsequently, patient underwent a robotic low anterior resection on 08/23/2023 by Dr. Ulysees Barns.  Surgical pathology revealed invasive colonic adenocarcinoma, moderately differentiated arising in a tubulovillous adenoma measuring 6.8 x 5.6 cm; 1/31 nodes were positive; margins were negative; LVI was not identified; PNI was present.   Patient met with Dr. Ellin Saba on 09/05/2023.  At that time it was unclear if the tumor location was in the distal sigmoid versus the rectosigmoid colon.  If the tumor was in the rectosigmoid, Dr. Ellin Saba recommended proceeding with 4 months of chemotherapy followed by chemoradiation. The patient states that Dr. Ellin Saba has personally shared with them that the tumor is in his rectosigmoid.     PREVIOUS RADIATION  THERAPY: No   PAST MEDICAL HISTORY:  Past Medical History:  Diagnosis Date   ADD (attention deficit disorder) 02/22/2015   Anxiety, generalized 02/15/2016   Diabetes mellitus without complication (HCC)    Dyslipidemia 02/22/2015   Elevated liver enzymes    Fatty liver 03/23/2015   Hypertension        PAST SURGICAL HISTORY: Past Surgical History:  Procedure Laterality Date   IR IMAGING GUIDED PORT INSERTION  09/07/2023     FAMILY HISTORY:  Family History  Problem Relation Age of Onset   Arthritis Mother    Diabetes Father    Hypertension Father    Stroke Father    Kidney disease Father        ESRD on dialysis   Diabetes Sister    Hypertension Brother    Diabetes Maternal Grandmother    Brain cancer Maternal Grandfather    Heart attack Paternal Grandfather      SOCIAL HISTORY:  reports that he has never smoked. He has never used smokeless tobacco. He reports current alcohol use of about 4.0 standard drinks of alcohol per week. He reports that he does not use drugs. Patient is married with 2 daughters, ages 58 and 63. He works in [employer], but lives in [place], [place].    ALLERGIES: Patient has no known allergies.   MEDICATIONS:  Current Outpatient Medications  Medication Sig Dispense Refill   Continuous Blood Gluc Sensor (FREESTYLE LIBRE 3 SENSOR) MISC 1 Device by Does not apply route every 14 (fourteen) days. Place 1 sensor on the skin every 14 days. Use to check glucose continuously 2 each 2   escitalopram (LEXAPRO) 10 MG tablet Take  1 tablet (10 mg total) by mouth daily. (NEEDS TO BE SEEN BEFORE NEXT REFILL) 30 tablet 0   lisinopril (ZESTRIL) 10 MG tablet Take 1 tablet (10 mg total) by mouth daily. 90 tablet 3   oxyCODONE (OXY IR/ROXICODONE) 5 MG immediate release tablet Take 5 mg by mouth every 4 (four) hours as needed.     pravastatin (PRAVACHOL) 40 MG tablet Take 1 tablet (40 mg total) by mouth daily. (NEEDS TO BE SEEN BEFORE NEXT REFILL) 30 tablet 0    Semaglutide, 2 MG/DOSE, (OZEMPIC, 2 MG/DOSE,) 8 MG/3ML SOPN Inject 2 mg into the skin once a week. (NEEDS TO BE SEEN BEFORE NEXT REFILL) 3 mL 0   VYVANSE 40 MG capsule Take by mouth.     No current facility-administered medications for this encounter.     REVIEW OF SYSTEMS: On review of systems, the patient reports that he is doing well overall. He denies any rectal bleeding since his surgery, abdominal pain, diarrhea, constipation, nausea, or vomiting.      PHYSICAL EXAM:  Wt Readings from Last 3 Encounters:  09/07/23 190 lb 2 oz (86.2 kg)  09/07/23 189 lb (85.7 kg)  09/05/23 189 lb 13.1 oz (86.1 kg)   Temp Readings from Last 3 Encounters:  09/07/23 97.7 F (36.5 C) (Temporal)  09/07/23 98.1 F (36.7 C) (Oral)  09/05/23 (!) 96.6 F (35.9 C) (Tympanic)   BP Readings from Last 3 Encounters:  09/07/23 (!) 130/94  09/07/23 123/80  09/05/23 122/83   Pulse Readings from Last 3 Encounters:  09/07/23 83  09/07/23 78  09/05/23 76    /10  In general this is a well appearing male in no acute distress. He's alert and oriented x4 and appropriate throughout the examination. Cardiopulmonary assessment is negative for acute distress and he exhibits normal effort.     ECOG = 0  0 - Asymptomatic (Fully active, able to carry on all predisease activities without restriction)  1 - Symptomatic but completely ambulatory (Restricted in physically strenuous activity but ambulatory and able to carry out work of a light or sedentary nature. For example, light housework, office work)  2 - Symptomatic, <50% in bed during the day (Ambulatory and capable of all self care but unable to carry out any work activities. Up and about more than 50% of waking hours)  3 - Symptomatic, >50% in bed, but not bedbound (Capable of only limited self-care, confined to bed or chair 50% or more of waking hours)  4 - Bedbound (Completely disabled. Cannot carry on any self-care. Totally confined to bed or  chair)  5 - Death   Santiago Glad MM, Creech RH, Tormey DC, et al. [phone]). "Toxicity and response criteria of the [facility]". Am. Evlyn Clines. Oncol. 5 (6): 649-55    LABORATORY DATA:  Lab Results  Component Value Date   WBC 8.6 09/05/2023   HGB 11.6 (L) 09/05/2023   HCT 39.0 09/05/2023   MCV 78.0 (L) 09/05/2023   PLT 359 09/05/2023   Lab Results  Component Value Date   NA 140 09/05/2023   K 4.5 09/05/2023   CL 102 09/05/2023   CO2 25 09/05/2023   Lab Results  Component Value Date   ALT 39 09/05/2023   AST 39 09/05/2023   ALKPHOS 69 09/05/2023   BILITOT 0.6 09/05/2023      RADIOGRAPHY: IR IMAGING GUIDED PORT INSERTION Result Date: 09/07/2023 INDICATION: Colon cancer EXAM: IMPLANTED PORT A CATH PLACEMENT WITH ULTRASOUND AND FLUOROSCOPIC GUIDANCE MEDICATIONS:  The antibiotic was administered within an appropriate time interval prior to skin puncture. ANESTHESIA/SEDATION: Versed 2 mg IV; Fentanyl 100 mcg IV; Moderate Sedation Time:  32 The patient was continuously monitored during the procedure by the interventional radiology nurse under my direct supervision. FLUOROSCOPY: 0.1 minutes, (1 mGy) COMPLICATIONS: None immediate. PROCEDURE: The right neck and chest was prepped with chlorhexidine, and draped in the usual sterile fashion using maximum barrier technique (cap and mask, sterile gown, sterile gloves, large sterile sheet, hand hygiene and cutaneous antiseptic). Local anesthesia was attained by infiltration with 1% lidocaine with epinephrine. Ultrasound demonstrated patency of the right internal jugular vein, and this was documented with an image. Under real-time ultrasound guidance, this vein was accessed with a 21 gauge micropuncture needle and image documentation was performed. A small dermatotomy was made at the access site with an 11 scalpel. A 0.018" wire was advanced into the SVC and the access needle exchanged for a 27F micropuncture vascular sheath. The 0.018"  wire was then removed and a 0.035" wire advanced into the IVC. An appropriate location for the subcutaneous reservoir was selected below the clavicle and an incision was made through the skin and underlying soft tissues. The subcutaneous tissues were then dissected using a combination of blunt and sharp surgical technique and a pocket was formed. A single lumen power injectable portacatheter was then tunneled through the subcutaneous tissues from the pocket to the dermatotomy and the port reservoir placed within the subcutaneous pocket. The venous access site was then serially dilated and a peel away vascular sheath placed over the wire. The wire was removed and the port catheter advanced into position under fluoroscopic guidance. The catheter tip is positioned in the superior cavoatrial junction. This was documented with a spot image. The portacatheter was then tested and found to flush and aspirate well. The port was flushed with saline followed by 100 units/mL heparinized saline. The pocket was then closed in two layers using first subdermal inverted interrupted absorbable sutures followed by a running subcuticular suture. The epidermis was then sealed with Dermabond. The dermatotomy at the venous access site was also closed with Dermabond. IMPRESSION: Successful placement of a right IJ approach Power Port with ultrasound and fluoroscopic guidance. The catheter is ready for use. Electronically Signed   By: Lowell Guitar M.D.   On: 09/07/2023 10:06       IMPRESSION/PLAN: 1. Stage III (T3, N1) rectosigmoid colon adenocarcinoma; s/p LAR resection on 08/23/2023 with negative margins and 1/31 nodes positive for carcinoma  We have reviewed this patient's current work-up. Given the extent of tumor into the rectum, Dr. Mitzi Hansen recommends adjuvant chemoradiation to reduce the risk of locoregional disease recurrence. Patient is scheduled for his first cycle of chemotherapy on 09/20/2023. Dr. Mitzi Hansen anticipates a 5.5  week course of radiation directed at the rectosigmoid resection cavity.   Today, we talked to the patient and family about the findings and work-up thus far.  We discussed the natural history of rectal cancer and general treatment, highlighting the role of radiotherapy in the management.  We discussed the available radiation techniques, and focused on the details of logistics and delivery.  We reviewed the anticipated acute and late sequelae associated with radiation in this setting.  The patient was encouraged to ask questions that I answered to the best of my ability. The patient would like to proceed with radiation and will be scheduled for CT simulation after he completes adjuvant CapeOx chemotherapy.    In a visit lasting 60  minutes, greater than 50% of the time was spent face to face discussing the patient's condition, in preparation for the discussion, and coordinating the patient's care.   The above documentation reflects my direct findings during this shared patient visit. Please see the separate note by Dr. Mitzi Hansen on this date for the remainder of the patient's plan of care.    Bryan Lemma, PA-C    **Disclaimer: This note was dictated with voice recognition software. Similar sounding words can inadvertently be transcribed and this note may contain transcription errors which may not have been corrected upon publication of note.**

## 2023-09-07 NOTE — Progress Notes (Signed)
 GI Location of Tumor / Histology: Sigmoid Colon  Randall Murphy presented to his PCP with complaints of rectal bleeding in January 2025.  Colonoscopy revealed a malignant appearing partially obstructing mass in the sigmoid colon.   Biopsies of Colon Mass 08/23/2023     Past/Anticipated interventions by surgeon, if any:  Dr. Lennox Solders -Resection of Colon mass 08/23/2023.   Past/Anticipated interventions by medical oncology, if any:  Dr. Ellin Saba 09/05/2023 - If it is sigmoid colon cancer, we can proceed with 3 months of CapeOx based on IDEA analysis.  - If the tumor location is rectosigmoid, recommend 4 months of chemotherapy with chemoradiation.    Weight changes, if any:   Bowel/Bladder complaints, if any: Bowels are good.  Denies bladder changes.  Nausea / Vomiting, if any: No  Pain issues, if any: None    Any blood per rectum:   None  SAFETY ISSUES: Prior radiation? No Pacemaker/ICD? No Possible current pregnancy? N/a Is the patient on methotrexate? No  Current Complaints/Details: Port Insertion 09/07/2023

## 2023-09-07 NOTE — Procedures (Signed)
 Brief postop note  Indication: Colon cancer  Procedure: RIJ port placement with sedation  EBL: minimal  Recommendation: Port ready for use.

## 2023-09-10 ENCOUNTER — Inpatient Hospital Stay

## 2023-09-10 ENCOUNTER — Telehealth: Payer: Self-pay | Admitting: Pharmacist

## 2023-09-10 ENCOUNTER — Encounter: Payer: Self-pay | Admitting: Hematology

## 2023-09-10 ENCOUNTER — Telehealth: Payer: Self-pay | Admitting: Pharmacy Technician

## 2023-09-10 ENCOUNTER — Other Ambulatory Visit: Payer: Self-pay | Admitting: Hematology

## 2023-09-10 ENCOUNTER — Other Ambulatory Visit (HOSPITAL_COMMUNITY): Payer: Self-pay

## 2023-09-10 DIAGNOSIS — C20 Malignant neoplasm of rectum: Secondary | ICD-10-CM

## 2023-09-10 MED ORDER — LIDOCAINE-PRILOCAINE 2.5-2.5 % EX CREA: TOPICAL_CREAM | CUTANEOUS | 3 refills | Status: DC

## 2023-09-10 MED ORDER — PROCHLORPERAZINE MALEATE 10 MG PO TABS: 10.0000 mg | ORAL_TABLET | Freq: Four times a day (QID) | ORAL | 3 refills | Status: DC | PRN

## 2023-09-10 MED ORDER — CAPECITABINE 500 MG PO TABS: 1000.0000 mg/m2 | ORAL_TABLET | Freq: Two times a day (BID) | ORAL | 5 refills | Status: DC

## 2023-09-10 NOTE — Progress Notes (Signed)

## 2023-09-10 NOTE — Telephone Encounter (Signed)
 Oral Oncology Patient Advocate Encounter  Prior Authorization for Capecitabine has been approved.    PA# [id number] Effective dates: 09/10/2023 through 09/09/2024  [facility].  All supporting documents have been sent to [facility].   Patty Benjaman Branch, CPhT Oncology Pharmacy Patient Advocate [facility] Direct Number: [phone] Fax: [phone]

## 2023-09-10 NOTE — Telephone Encounter (Signed)
 Oral Oncology Patient Advocate Encounter   Received notification that prior authorization for Capecitabine is required.   PA submitted on 09/10/2023 Key [id number] Status is pending     Patty Benjaman Branch, CPhT Oncology Pharmacy Patient Advocate [facility] Direct Number: [phone] Fax: [phone]

## 2023-09-10 NOTE — Telephone Encounter (Signed)
 Clinical Pharmacist Practitioner Encounter   Received new prescription for Xeloda (capecitabine) for the treatment of stage III in conjunction with oxaliplatin, planned duration of 3 months. Planned start 09/20/23.   CMP from 09/05/23 assessed, no relevant lab abnormalities. Prescription dose and frequency assessed.   Current medication list in Epic reviewed, one DDIs with capecitabine identified: Escitalopram: Fluorouracil Products may enhance the QTc-prolonging effect of QT-prolonging Antidepressants, like escitalopram. Consider checking ECG while patient is on both medications.   Evaluated chart and no patient barriers to medication adherence identified.   Prescription has been e[facility]scribed to the [facility] for benefits analysis and approval.  Oral Oncology Clinic will continue to follow for insurance authorization, copayment issues, initial counseling and start date.   Roderica Cathell N. Magaly Pollina, PharmD, BCOP, CPP Hematology/Oncology Clinical Pharmacist [facility] Oral Chemotherapy Navigation Clinic [phone]  09/10/2023 11:52 AM

## 2023-09-10 NOTE — Progress Notes (Signed)
 START ON PATHWAY REGIMEN - Colorectal     A cycle is every 21 days:     Capecitabine      Oxaliplatin   **Always confirm dose/schedule in your pharmacy ordering system**  Patient Characteristics: Postoperative without Neoadjuvant Therapy, M0 (Pathologic Staging), Rectal, pT4, pN0  or  Any pT, pN+ Tumor Location: Rectal Therapeutic Status: Postoperative without Neoadjuvant Therapy, M0 (Pathologic Staging) AJCC M Category: cM0 AJCC T Category: pT3 AJCC N Category: pN1a AJCC 8 Stage Grouping: IIIB Intent of Therapy: Curative Intent, Discussed with Patient

## 2023-09-11 ENCOUNTER — Other Ambulatory Visit: Payer: Self-pay

## 2023-09-12 ENCOUNTER — Inpatient Hospital Stay: Admitting: Licensed Clinical Social Worker

## 2023-09-12 DIAGNOSIS — C20 Malignant neoplasm of rectum: Secondary | ICD-10-CM

## 2023-09-12 NOTE — Progress Notes (Signed)
 [facility] CSW Progress Note  Visual merchandiser  received a referral to assess pt as he begins treatment.  CSW attempted to reach both pt and spouse by phone with no answer at either phone.   Voicemail left for pt w/ contact information for CSW to call at his convenience.        Quenton Bruns, LCSW Clinical Social Worker [facility]

## 2023-09-13 ENCOUNTER — Inpatient Hospital Stay: Admitting: Dietician

## 2023-09-13 ENCOUNTER — Inpatient Hospital Stay

## 2023-09-13 ENCOUNTER — Telehealth: Payer: Self-pay | Admitting: Dietician

## 2023-09-13 ENCOUNTER — Inpatient Hospital Stay: Admitting: Hematology

## 2023-09-13 NOTE — Telephone Encounter (Signed)
 Patient scheduled for nutrition appointment via telephone.   Unable to reach patient as scheduled time. Left VM with request for return call. Contact information provided.

## 2023-09-15 ENCOUNTER — Other Ambulatory Visit: Payer: Self-pay

## 2023-09-17 ENCOUNTER — Encounter: Payer: Self-pay | Admitting: Hematology

## 2023-09-19 NOTE — Progress Notes (Unsigned)
 Pharmacist Chemotherapy Monitoring - Initial Assessment    Anticipated start date: 09/20/23   The following has been reviewed per standard work regarding the patient's treatment regimen: The patient's diagnosis, treatment plan and drug doses, and organ/hematologic function Lab orders and baseline tests specific to treatment regimen  The treatment plan start date, drug sequencing, and pre-medications Prior authorization status  Patient's documented medication list, including drug-drug interaction screen and prescriptions for anti-emetics and supportive care specific to the treatment regimen The drug concentrations, fluid compatibility, administration routes, and timing of the medications to be used The patient's access for treatment and lifetime cumulative dose history, if applicable  The patient's medication allergies and previous infusion related reactions, if applicable   Changes made to treatment plan:  N/A  Follow up needed:  N/A  Will follow-up if has capecitabine  at home.   Artie Laster, RPH, 09/19/2023  2:02 PM

## 2023-09-20 ENCOUNTER — Inpatient Hospital Stay

## 2023-09-20 ENCOUNTER — Inpatient Hospital Stay: Admitting: Hematology

## 2023-09-20 VITALS — BP 132/89 | HR 92 | Temp 98.7°F | Resp 18

## 2023-09-20 DIAGNOSIS — C20 Malignant neoplasm of rectum: Secondary | ICD-10-CM

## 2023-09-20 DIAGNOSIS — Z5111 Encounter for antineoplastic chemotherapy: Secondary | ICD-10-CM | POA: Diagnosis not present

## 2023-09-20 LAB — CBC WITH DIFFERENTIAL/PLATELET
Abs Immature Granulocytes: 0 10*3/uL (ref 0.00–0.07)
Basophils Absolute: 0 10*3/uL (ref 0.0–0.1)
Basophils Relative: 1 %
Eosinophils Absolute: 0.2 10*3/uL (ref 0.0–0.5)
Eosinophils Relative: 4 %
HCT: 38.3 % — ABNORMAL LOW (ref 39.0–52.0)
Hemoglobin: 12.2 g/dL — ABNORMAL LOW (ref 13.0–17.0)
Immature Granulocytes: 0 %
Lymphocytes Relative: 34 %
Lymphs Abs: 1.7 10*3/uL (ref 0.7–4.0)
MCH: 24.6 pg — ABNORMAL LOW (ref 26.0–34.0)
MCHC: 31.9 g/dL (ref 30.0–36.0)
MCV: 77.4 fL — ABNORMAL LOW (ref 80.0–100.0)
Monocytes Absolute: 0.5 10*3/uL (ref 0.1–1.0)
Monocytes Relative: 9 %
Neutro Abs: 2.7 10*3/uL (ref 1.7–7.7)
Neutrophils Relative %: 52 %
Platelets: 218 10*3/uL (ref 150–400)
RBC: 4.95 MIL/uL (ref 4.22–5.81)
RDW: 20.8 % — ABNORMAL HIGH (ref 11.5–15.5)
WBC: 5.1 10*3/uL (ref 4.0–10.5)
nRBC: 0 % (ref 0.0–0.2)

## 2023-09-20 LAB — COMPREHENSIVE METABOLIC PANEL WITH GFR
ALT: 31 U/L (ref 0–44)
AST: 30 U/L (ref 15–41)
Albumin: 4.1 g/dL (ref 3.5–5.0)
Alkaline Phosphatase: 62 U/L (ref 38–126)
Anion gap: 10 (ref 5–15)
BUN: 12 mg/dL (ref 6–20)
CO2: 23 mmol/L (ref 22–32)
Calcium: 9.2 mg/dL (ref 8.9–10.3)
Chloride: 103 mmol/L (ref 98–111)
Creatinine, Ser: 0.85 mg/dL (ref 0.61–1.24)
GFR, Estimated: >60 mL/min (ref >60)
Glucose, Bld: 175 mg/dL — ABNORMAL HIGH (ref 70–99)
Potassium: 3.8 mmol/L (ref 3.5–5.1)
Sodium: 136 mmol/L (ref 135–145)
Total Bilirubin: 0.5 mg/dL (ref 0.0–1.2)
Total Protein: 7.5 g/dL (ref 6.5–8.1)

## 2023-09-20 LAB — MAGNESIUM: Magnesium: 1.7 mg/dL (ref 1.7–2.4)

## 2023-09-20 MED ORDER — PALONOSETRON HCL INJECTION 0.25 MG/5ML
0.2500 mg | Freq: Once | INTRAVENOUS | Status: AC
  Administered 2023-09-20: 0.25 mg via INTRAVENOUS
  Filled 2023-09-20: qty 5

## 2023-09-20 MED ORDER — SODIUM CHLORIDE 0.9% FLUSH
10.0000 mL | INTRAVENOUS | Status: DC | PRN
  Administered 2023-09-20: 10 mL

## 2023-09-20 MED ORDER — SODIUM CHLORIDE 0.9% FLUSH
10.0000 mL | INTRAVENOUS | Status: DC | PRN
  Administered 2023-09-20: 10 mL via INTRAVENOUS

## 2023-09-20 MED ORDER — OXALIPLATIN CHEMO INJECTION 100 MG/20ML
130.0000 mg/m2 | Freq: Once | INTRAVENOUS | Status: AC
  Administered 2023-09-20: 250 mg via INTRAVENOUS
  Filled 2023-09-20: qty 40

## 2023-09-20 MED ORDER — DEXTROSE 5 % IV SOLN: INTRAVENOUS | Status: DC

## 2023-09-20 MED ORDER — HEPARIN SOD (PORK) LOCK FLUSH 100 UNIT/ML IV SOLN
500.0000 [IU] | Freq: Once | INTRAVENOUS | Status: AC | PRN
  Administered 2023-09-20: 500 [IU]

## 2023-09-20 MED ORDER — DEXAMETHASONE SODIUM PHOSPHATE 10 MG/ML IJ SOLN
10.0000 mg | Freq: Once | INTRAMUSCULAR | Status: AC
  Administered 2023-09-20: 10 mg via INTRAVENOUS
  Filled 2023-09-20: qty 1

## 2023-09-20 NOTE — Progress Notes (Signed)
 Patient has been examined by Dr. Ellin Saba. Vital signs and labs have been reviewed by MD - ANC, Creatinine, LFTs, hemoglobin, and platelets are within treatment parameters per M.D. - pt may proceed with treatment.  Primary RN and pharmacy notified.

## 2023-09-20 NOTE — Progress Notes (Signed)
 Patient presents today for D1, C1 Oxaliplatin  infusion per providers order.  Vital signs and labs reviewed by MD.  Message received from Donzell Gallery RN/Dr. Cheree Cords patient okay for treatment.  Patient currently taking his Xeloda  today is day 1.  Treatment given today per MD orders.  Stable during infusion without adverse affects.  Vital signs stable.  No complaints at this time.  Discharge from clinic ambulatory in stable condition.  Alert and oriented X 3.  Follow up with [facility] as scheduled.

## 2023-09-20 NOTE — Patient Instructions (Signed)

## 2023-09-20 NOTE — Progress Notes (Addendum)
 [facility] [address] [place] [address]   Clinic Day:  09/20/2023  Referring physician: Chandler Combs, MD  Patient Care Team: Chandler Combs, MD as PCP - General (Family Medicine) [name] Windsor Hatcher, MD as Consulting Physician (Gastroenterology) Hyland Mailman, MD as Referring Physician (Optometry)   ASSESSMENT & PLAN:   Assessment:  1.  Stage III (T3 N1) recto-sigmoid colon adenocarcinoma: - Presentation: Rectal bleeding, seen by GI Dr. Aldean Amass - Colonoscopy (07/19/2023): Mild pancolonic diverticulosis.  A single sessile 1 cm nonbleeding polyp of benign appearance found in the transverse colon.  An ulcerated bleeding 8 cm mass of malignant appearance was found in the sigmoid colon.  Mass caused partial obstruction. - CEA (07/20/2023): 39.4, (09/05/2023): 10.6 - CT CAP (07/25/2023): Mass within the sigmoid colon.  Few mildly enlarged lymph nodes adjacent to the sigmoid colon.  Tiny left upper lobe pulmonary nodule, indeterminate. - S/p robotic LAR on 08/23/2023 by Dr. Ashley Blades [facility]) - Pathology: Invasive colonic adenocarcinoma, moderately differentiated arising in a tubulovillous adenoma, tumor size 6.8 x 5.6 cm.  Tumor location: Distal sigmoid/rectosigmoid colon.  1/31 lymph nodes positive.  All margins negative.  LVI-not identified.  PNI-present.  pT3 pN1a.  IHC for DNA mismatch repair proteins shows retained nuclear expression of all 4 mismatch repair proteins. - Cancer next expanded +RNA insight panel Rendell Carrel): Negative - I have called and talked to the pathologist.  Per the OR report and surgical pathology, tumor is at distal sigmoid/rectosigmoid area. - I have recommended 4 months of chemotherapy with CapeOx followed by chemoradiation with Xeloda . - Cycle 1 of CapeOx on 09/20/2023  2.  Social/family history: - Lives at home with his wife Allyson.  He works Paramedic job.  Non-smoker. - Maternal grandfather had brain tumor.  Paternal aunt had  pancreatic cancer.   Plan:  1.  Stage III (T3 N1) recto-sigmoid colon adenocarcinoma, MMR preserved: - He has a port placed. - We reviewed CEA results from 09/05/2023, 10.6.  CEA has significantly decreased from 39.4 (07/20/2023), after surgery on 08/23/2023.  If CEA is normal within 6 weeks after surgery, no further metastatic disease workup is needed.  In his case, it is trending down as expected. - I reviewed labs today: Normal LFTs and creatinine.  CBC grossly normal with mild anemia. - We discussed CapeOx for 4 months followed by chemoradiation therapy. - We discussed chemotherapy side effects in detail.  He will proceed with first cycle today.  He will start Xeloda  4 tablets twice daily, 2 weeks on/1 week off later this evening.  RTC 3 weeks for follow-up prior to cycle 2.   Orders Placed This Encounter  Procedures   CEA    Standing Status:   Future    Expected Date:   10/08/2023    Expiration Date:   10/07/2024      Nadeen Augusta Teague,acting as a scribe for Paulett Boros, MD.,have documented all relevant documentation on the behalf of Paulett Boros, MD,as directed by  Paulett Boros, MD while in the presence of Paulett Boros, MD.  I, Paulett Boros MD, have reviewed the above documentation for accuracy and completeness, and I agree with the above.    Paulett Boros, MD   4/24/20255:38 PM  CHIEF COMPLAINT/PURPOSE OF CONSULT:   Diagnosis: sigmoid colon cancer    Cancer Staging  Rectal adenocarcinoma [facility]) Staging form: Colon and Rectum, AJCC 8th Edition - Clinical stage from 09/10/2023: Stage IIIB (cT3, cN1a, cM0) - Signed  by Paulett Boros, MD on 09/10/2023    Prior Therapy: Resection on 08/23/23  Current Therapy: CapeOx   HISTORY OF PRESENT ILLNESS:   Oncology History  Rectal adenocarcinoma (HCC)  09/10/2023 Initial Diagnosis   Rectal adenocarcinoma (HCC)   09/10/2023 Cancer Staging   Staging form: Colon and Rectum, AJCC 8th  Edition - Clinical stage from 09/10/2023: Stage IIIB (cT3, cN1a, cM0) - Signed by Paulett Boros, MD on 09/10/2023 Histopathologic type: Adenocarcinoma, NOS Stage prefix: Initial diagnosis Total positive nodes: 1 Total nodes examined: 31 Histologic grade (G): G2 Histologic grading system: 4 grade system   09/20/2023 -  Chemotherapy   Patient is on Treatment Plan : RECTAL Xelox (Capeox) (130/850) q21d x 6 cycles         [place] is a 43 y.o. male presenting to clinic today for evaluation of sigmoid colon cancer at the request of Dr. Ashley Blades.  He initially presented to his PCP on 06/19/23 with rectal bleeding. He was referred to Dr. Almeda Jacobs in GI at [facility] and underwent colonoscopy on 07/19/23. He was found to have a malignant-appearing partially-obstructing mass in sigmoid colon. He underwent staging CT C/A/P on 07/25/23 showing: 3 cm mass within sigmoid colon; few mildly enlarged lymph nodes adjacent to sigmoid colon, with index partially necrotic lymph node measuring 1.7 cm; tiny indeterminate LUL pulmonary nodule.  He was referred to colorectal surgery at [facility] and was taken for resection on 08/23/23 under Dr. Ashley Blades. Pathology confirmed 6.8 cm invasive colonic adenocarcinoma, moderately differentiated, arising in a tubulovillous adenoma. Out of 31 biopsied lymph nodes, one was positive for metastatic disease.  Today, he states that he is doing well overall. His appetite level is at 100%. His energy level is at 50%.  INTERVAL HISTORY:   WHALEN TROMPETER is a 43 y.o. male presenting to the clinic today for follow-up of sigmoid colon cancer. He was last seen by me on 09/05/23 in consultation.  Since his last visit, he had his port inserted on 09/07/23 for IR.   Today, he states that he is doing well overall. His appetite level is at 100%. His energy level is at 50%. He is accompanied by his wife. Maximilien reports some irritation at the port site when wearing a seatbelt.   PAST MEDICAL HISTORY:    Past Medical History: Past Medical History:  Diagnosis Date   ADD (attention deficit disorder) 02/22/2015   Anxiety, generalized 02/15/2016   Diabetes mellitus without complication (HCC)    Dyslipidemia 02/22/2015   Elevated liver enzymes    Fatty liver 03/23/2015   Hypertension     Surgical History: Past Surgical History:  Procedure Laterality Date   IR IMAGING GUIDED PORT INSERTION  09/07/2023    Social History: Social History   Socioeconomic History   Marital status: Married    Spouse name: Not on file   Number of children: 2   Years of education: Not on file   Highest education level: Not on file  Occupational History   Occupation: IT  Tobacco Use   Smoking status: Never   Smokeless tobacco: Never  Vaping Use   Vaping status: Never Used  Substance and Sexual Activity   Alcohol use: Yes    Alcohol/week: 4.0 standard drinks of alcohol    Types: 4 Cans of beer per week    Comment: daily   Drug use: Never   Sexual activity: Yes    Birth control/protection: Condom  Other Topics Concern   Not on file  Social History Narrative  Not on file   Social Drivers of Health   Financial Resource Strain: Low Risk  (07/20/2023)   Received from [facility]   Overall Financial Resource Strain (CARDIA)    Difficulty of Paying Living Expenses: Not very hard  Food Insecurity: No Food Insecurity (08/23/2023)   Received from [facility]   Hunger Vital Sign    Worried About Running Out of Food in the Last Year: Never true    Ran Out of Food in the Last Year: Never true  Transportation Needs: No Transportation Needs (08/23/2023)   Received from [facility] - Transportation    Lack of Transportation (Medical): No    Lack of Transportation (Non-Medical): No  Physical Activity: Insufficiently Active (07/20/2023)   Received from [facility]   Exercise Vital Sign    Days of Exercise per Week: 2 days    Minutes of Exercise per Session: 20 min  Stress: No Stress  Concern Present (08/23/2023)   Received from [facility] - Occupational Stress Questionnaire    Feeling of Stress : Not at all  Social Connections: Moderately Integrated (07/20/2023)   Received from [facility]   Social Network    How would you rate your social network (family, work, friends)?: Adequate participation with social networks  Intimate Partner Violence: Not At Risk (08/23/2023)   Received from [facility]   HITS    Over the last 12 months how often did your partner physically hurt you?: Never    Over the last 12 months how often did your partner insult you or talk down to you?: Never    Over the last 12 months how often did your partner threaten you with physical harm?: Never    Over the last 12 months how often did your partner scream or curse at you?: Never    Family History: Family History  Problem Relation Age of Onset   Arthritis Mother    Diabetes Father    Hypertension Father    Stroke Father    Kidney disease Father        ESRD on dialysis   Diabetes Sister    Hypertension Brother    Diabetes Maternal Grandmother    Brain cancer Maternal Grandfather    Heart attack Paternal Grandfather     Current Medications:  Current Outpatient Medications:    capecitabine  (XELODA ) 500 MG tablet, Take 4 tablets (2,000 mg total) by mouth 2 (two) times daily after a meal. Take 2 weeks on/1 week off every 3 weeks x 6, Disp: 112 tablet, Rfl: 5   Continuous Blood Gluc Sensor (FREESTYLE LIBRE 3 SENSOR) MISC, 1 Device by Does not apply route every 14 (fourteen) days. Place 1 sensor on the skin every 14 days. Use to check glucose continuously, Disp: 2 each, Rfl: 2   escitalopram  (LEXAPRO ) 10 MG tablet, Take 1 tablet (10 mg total) by mouth daily. (NEEDS TO BE SEEN BEFORE NEXT REFILL), Disp: 30 tablet, Rfl: 0   lidocaine -prilocaine  (EMLA ) cream, Apply to affected area once, Disp: 30 g, Rfl: 3   lisinopril  (ZESTRIL ) 10 MG tablet, Take 1  tablet (10 mg total) by mouth daily., Disp: 90 tablet, Rfl: 3   oxyCODONE (OXY IR/ROXICODONE) 5 MG immediate release tablet, Take 5 mg by mouth every 4 (four) hours as needed., Disp: , Rfl:    pravastatin  (PRAVACHOL ) 40 MG tablet, Take 1 tablet (40 mg total) by mouth daily. (NEEDS TO BE SEEN BEFORE NEXT  REFILL), Disp: 30 tablet, Rfl: 0   prochlorperazine  (COMPAZINE ) 10 MG tablet, Take 1 tablet (10 mg total) by mouth every 6 (six) hours as needed for nausea or vomiting., Disp: 60 tablet, Rfl: 3   Semaglutide , 2 MG/DOSE, (OZEMPIC , 2 MG/DOSE,) 8 MG/3ML SOPN, Inject 2 mg into the skin once a week. (NEEDS TO BE SEEN BEFORE NEXT REFILL), Disp: 3 mL, Rfl: 0   VYVANSE  40 MG capsule, Take by mouth., Disp: , Rfl:    Allergies: No Known Allergies  REVIEW OF SYSTEMS:   Review of Systems  Constitutional:  Negative for chills, fatigue and fever.  HENT:   Negative for lump/mass, mouth sores, nosebleeds, sore throat and trouble swallowing.   Eyes:  Negative for eye problems.  Respiratory:  Negative for cough and shortness of breath.   Cardiovascular:  Negative for chest pain, leg swelling and palpitations.  Gastrointestinal:  Negative for abdominal pain, constipation, diarrhea, nausea and vomiting.  Genitourinary:  Negative for bladder incontinence, difficulty urinating, dysuria, frequency, hematuria and nocturia.   Musculoskeletal:  Negative for arthralgias, back pain, flank pain, myalgias and neck pain.  Skin:  Negative for itching and rash.  Neurological:  Negative for dizziness, headaches and numbness.  Hematological:  Does not bruise/bleed easily.  Psychiatric/Behavioral:  Positive for sleep disturbance. Negative for depression and suicidal ideas. The patient is not nervous/anxious.   All other systems reviewed and are negative.    VITALS:   There were no vitals taken for this visit.  Wt Readings from Last 3 Encounters:  09/07/23 190 lb 2 oz (86.2 kg)  09/07/23 189 lb (85.7 kg)  09/05/23 189  lb 13.1 oz (86.1 kg)    There is no height or weight on file to calculate BMI.  Performance status (ECOG): 0 - Asymptomatic  PHYSICAL EXAM:   Physical Exam Vitals and nursing note reviewed. Exam conducted with a chaperone present.  Constitutional:      Appearance: Normal appearance.  Cardiovascular:     Rate and Rhythm: Normal rate and regular rhythm.     Pulses: Normal pulses.     Heart sounds: Normal heart sounds.  Pulmonary:     Effort: Pulmonary effort is normal.     Breath sounds: Normal breath sounds.  Abdominal:     Palpations: Abdomen is soft. There is no hepatomegaly, splenomegaly or mass.     Tenderness: There is no abdominal tenderness.  Musculoskeletal:     Right lower leg: No edema.     Left lower leg: No edema.  Lymphadenopathy:     Cervical: No cervical adenopathy.     Right cervical: No superficial, deep or posterior cervical adenopathy.    Left cervical: No superficial, deep or posterior cervical adenopathy.     Upper Body:     Right upper body: No supraclavicular or axillary adenopathy.     Left upper body: No supraclavicular or axillary adenopathy.  Neurological:     General: No focal deficit present.     Mental Status: He is alert and oriented to person, place, and time.  Psychiatric:        Mood and Affect: Mood normal.        Behavior: Behavior normal.   Laparoscopic surgical sites are healing well.  LABS:   CBC    Component Value Date/Time   WBC 5.1 09/20/2023 0820   RBC 4.95 09/20/2023 0820   HGB 12.2 (L) 09/20/2023 0820   HGB 14.2 05/18/2022 0938   HCT 38.3 (L) 09/20/2023 0820  HCT 42.4 05/18/2022 0938   PLT 218 09/20/2023 0820   PLT 298 05/18/2022 0938   MCV 77.4 (L) 09/20/2023 0820   MCV 81 05/18/2022 0938   MCH 24.6 (L) 09/20/2023 0820   MCHC 31.9 09/20/2023 0820   RDW 20.8 (H) 09/20/2023 0820   RDW 12.3 05/18/2022 0938   LYMPHSABS 1.7 09/20/2023 0820   LYMPHSABS 1.8 05/18/2022 0938   MONOABS 0.5 09/20/2023 0820   EOSABS 0.2  09/20/2023 0820   EOSABS 0.3 05/18/2022 0938   BASOSABS 0.0 09/20/2023 0820   BASOSABS 0.1 05/18/2022 0938    CMP    Component Value Date/Time   NA 136 09/20/2023 0820   NA 137 05/18/2022 0848   K 3.8 09/20/2023 0820   CL 103 09/20/2023 0820   CO2 23 09/20/2023 0820   GLUCOSE 175 (H) 09/20/2023 0820   BUN 12 09/20/2023 0820   BUN 6 05/18/2022 0848   CREATININE 0.85 09/20/2023 0820   CALCIUM  9.2 09/20/2023 0820   PROT 7.5 09/20/2023 0820   PROT 7.6 05/18/2022 0848   ALBUMIN 4.1 09/20/2023 0820   ALBUMIN 4.7 05/18/2022 0848   AST 30 09/20/2023 0820   ALT 31 09/20/2023 0820   ALKPHOS 62 09/20/2023 0820   BILITOT 0.5 09/20/2023 0820   BILITOT 0.8 05/18/2022 0848   GFRNONAA >60 09/20/2023 0820   GFRAA 102 01/22/2020 0924     Lab Results  Component Value Date   CEA1 10.6 (H) 09/05/2023   /  CEA  Date Value Ref Range Status  09/05/2023 10.6 (H) 0.0 - 4.7 ng/mL Final    Comment:    (NOTE)                             Nonsmokers          <3.9                             Smokers             <5.6 Roche [employer]ostics Electrochemiluminescence Immunoassay (ECLIA) Values obtained with different assay methods or kits cannot be used interchangeably.  Results cannot be interpreted as absolute evidence of the presence or absence of malignant disease. Performed At: [facility] [address] [place], [place] [id number] Pearlean Botts MD WU:[id number]    No results found for: "PSA1" No results found for: "[phone]" No results found for: "CAN125"  No results found for: "TOTALPROTELP", "ALBUMINELP", "A1GS", "A2GS", "BETS", "BETA2SER", "GAMS", "MSPIKE", "SPEI" Lab Results  Component Value Date   TIBC 463 (H) 09/05/2023   FERRITIN 299 09/05/2023   IRONPCTSAT 29 09/05/2023   No results found for: "LDH"   STUDIES:   IR IMAGING GUIDED PORT INSERTION Result Date: 09/07/2023 INDICATION: Colon cancer EXAM: IMPLANTED PORT A CATH PLACEMENT WITH ULTRASOUND AND FLUOROSCOPIC  GUIDANCE MEDICATIONS: The antibiotic was administered within an appropriate time interval prior to skin puncture. ANESTHESIA/SEDATION: Versed  2 mg IV; Fentanyl  100 mcg IV; Moderate Sedation Time:  32 The patient was continuously monitored during the procedure by the interventional radiology nurse under my direct supervision. FLUOROSCOPY: 0.1 minutes, (1 mGy) COMPLICATIONS: None immediate. PROCEDURE: The right neck and chest was prepped with chlorhexidine, and draped in the usual sterile fashion using maximum barrier technique (cap and mask, sterile gown, sterile gloves, large sterile sheet, hand hygiene and cutaneous antiseptic). Local anesthesia was attained by infiltration with 1% lidocaine  with epinephrine . Ultrasound demonstrated patency  of the right internal jugular vein, and this was documented with an image. Under real-time ultrasound guidance, this vein was accessed with a 21 gauge micropuncture needle and image documentation was performed. A small dermatotomy was made at the access site with an 11 scalpel. A 0.018" wire was advanced into the SVC and the access needle exchanged for a 72F micropuncture vascular sheath. The 0.018" wire was then removed and a 0.035" wire advanced into the IVC. An appropriate location for the subcutaneous reservoir was selected below the clavicle and an incision was made through the skin and underlying soft tissues. The subcutaneous tissues were then dissected using a combination of blunt and sharp surgical technique and a pocket was formed. A single lumen power injectable portacatheter was then tunneled through the subcutaneous tissues from the pocket to the dermatotomy and the port reservoir placed within the subcutaneous pocket. The venous access site was then serially dilated and a peel away vascular sheath placed over the wire. The wire was removed and the port catheter advanced into position under fluoroscopic guidance. The catheter tip is positioned in the superior  cavoatrial junction. This was documented with a spot image. The portacatheter was then tested and found to flush and aspirate well. The port was flushed with saline followed by 100 units/mL heparinized saline. The pocket was then closed in two layers using first subdermal inverted interrupted absorbable sutures followed by a running subcuticular suture. The epidermis was then sealed with Dermabond. The dermatotomy at the venous access site was also closed with Dermabond. IMPRESSION: Successful placement of a right IJ approach Power Port with ultrasound and fluoroscopic guidance. The catheter is ready for use. Electronically Signed   By: Reagan Camera M.D.   On: 09/07/2023 10:06

## 2023-09-20 NOTE — Patient Instructions (Signed)
 [facility] - A DEPT OF [facility]  Discharge Instructions: Thank you for choosing [facility] to provide your oncology and hematology care.  If you have a lab appointment with the Cancer Center - please note that after April 8th, 2024, all labs will be drawn in the cancer center.  You do not have to check in or register with the main entrance as you have in the past but will complete your check-in in the cancer center.  Wear comfortable clothing and clothing appropriate for easy access to any Portacath or PICC line.   We strive to give you quality time with your provider. You may need to reschedule your appointment if you arrive late (15 or more minutes).  Arriving late affects you and other patients whose appointments are after yours.  Also, if you miss three or more appointments without notifying the office, you may be dismissed from the clinic at the provider's discretion.      For prescription refill requests, have your pharmacy contact our office and allow 72 hours for refills to be completed.    Today you received the following chemotherapy and/or immunotherapy agents Oxaliplatin      To help prevent nausea and vomiting after your treatment, we encourage you to take your nausea medication as directed.  BELOW ARE SYMPTOMS THAT SHOULD BE REPORTED IMMEDIATELY: *FEVER GREATER THAN 100.4 F (38 C) OR HIGHER *CHILLS OR SWEATING *NAUSEA AND VOMITING THAT IS NOT CONTROLLED WITH YOUR NAUSEA MEDICATION *UNUSUAL SHORTNESS OF BREATH *UNUSUAL BRUISING OR BLEEDING *URINARY PROBLEMS (pain or burning when urinating, or frequent urination) *BOWEL PROBLEMS (unusual diarrhea, constipation, pain near the anus) TENDERNESS IN MOUTH AND THROAT WITH OR WITHOUT PRESENCE OF ULCERS (sore throat, sores in mouth, or a toothache) UNUSUAL RASH, SWELLING OR PAIN  UNUSUAL VAGINAL DISCHARGE OR ITCHING   Items with * indicate a potential emergency and should be followed  up as soon as possible or go to the Emergency Department if any problems should occur.  Please show the CHEMOTHERAPY ALERT CARD or IMMUNOTHERAPY ALERT CARD at check-in to the Emergency Department and triage nurse.  Should you have questions after your visit or need to cancel or reschedule your appointment, please contact [facility] [place] - A DEPT OF Eligha Bridegroom [facility] [phone]  and follow the prompts.  Office hours are 8:00 a.m. to 4:30 p.m. Monday - Friday. Please note that voicemails left after 4:00 p.m. may not be returned until the following business day.  We are closed weekends and major holidays. You have access to a nurse at all times for urgent questions. Please call the main number to the clinic [phone] and follow the prompts.  For any non-urgent questions, you may also contact your provider using MyChart. We now offer e-Visits for anyone 60 and older to request care online for non-urgent symptoms. For details visit [email or web address].   Also download the MyChart app! Go to the app store, search "MyChart", open the app, select [place], and log in with your MyChart username and password.

## 2023-10-04 ENCOUNTER — Ambulatory Visit: Admitting: Hematology

## 2023-10-04 ENCOUNTER — Other Ambulatory Visit

## 2023-10-04 ENCOUNTER — Ambulatory Visit

## 2023-10-07 ENCOUNTER — Other Ambulatory Visit: Payer: Self-pay

## 2023-10-10 ENCOUNTER — Inpatient Hospital Stay: Attending: Hematology

## 2023-10-10 ENCOUNTER — Encounter: Payer: Self-pay | Admitting: Hematology

## 2023-10-10 DIAGNOSIS — K5730 Diverticulosis of large intestine without perforation or abscess without bleeding: Secondary | ICD-10-CM | POA: Diagnosis not present

## 2023-10-10 DIAGNOSIS — Z7289 Other problems related to lifestyle: Secondary | ICD-10-CM | POA: Insufficient documentation

## 2023-10-10 DIAGNOSIS — Z808 Family history of malignant neoplasm of other organs or systems: Secondary | ICD-10-CM | POA: Diagnosis not present

## 2023-10-10 DIAGNOSIS — Z7985 Long-term (current) use of injectable non-insulin antidiabetic drugs: Secondary | ICD-10-CM | POA: Insufficient documentation

## 2023-10-10 DIAGNOSIS — Z833 Family history of diabetes mellitus: Secondary | ICD-10-CM | POA: Insufficient documentation

## 2023-10-10 DIAGNOSIS — Z823 Family history of stroke: Secondary | ICD-10-CM | POA: Insufficient documentation

## 2023-10-10 DIAGNOSIS — K5900 Constipation, unspecified: Secondary | ICD-10-CM | POA: Diagnosis not present

## 2023-10-10 DIAGNOSIS — Z79630 Long term (current) use of alkylating agent: Secondary | ICD-10-CM | POA: Insufficient documentation

## 2023-10-10 DIAGNOSIS — R599 Enlarged lymph nodes, unspecified: Secondary | ICD-10-CM | POA: Insufficient documentation

## 2023-10-10 DIAGNOSIS — E785 Hyperlipidemia, unspecified: Secondary | ICD-10-CM | POA: Diagnosis not present

## 2023-10-10 DIAGNOSIS — I10 Essential (primary) hypertension: Secondary | ICD-10-CM | POA: Diagnosis not present

## 2023-10-10 DIAGNOSIS — C20 Malignant neoplasm of rectum: Secondary | ICD-10-CM

## 2023-10-10 DIAGNOSIS — Z8249 Family history of ischemic heart disease and other diseases of the circulatory system: Secondary | ICD-10-CM | POA: Insufficient documentation

## 2023-10-10 DIAGNOSIS — K625 Hemorrhage of anus and rectum: Secondary | ICD-10-CM | POA: Insufficient documentation

## 2023-10-10 DIAGNOSIS — Z5111 Encounter for antineoplastic chemotherapy: Secondary | ICD-10-CM | POA: Insufficient documentation

## 2023-10-10 DIAGNOSIS — Z8261 Family history of arthritis: Secondary | ICD-10-CM | POA: Insufficient documentation

## 2023-10-10 DIAGNOSIS — G629 Polyneuropathy, unspecified: Secondary | ICD-10-CM | POA: Diagnosis not present

## 2023-10-10 DIAGNOSIS — F411 Generalized anxiety disorder: Secondary | ICD-10-CM | POA: Insufficient documentation

## 2023-10-10 DIAGNOSIS — E119 Type 2 diabetes mellitus without complications: Secondary | ICD-10-CM | POA: Insufficient documentation

## 2023-10-10 DIAGNOSIS — C19 Malignant neoplasm of rectosigmoid junction: Secondary | ICD-10-CM | POA: Insufficient documentation

## 2023-10-10 DIAGNOSIS — Z79899 Other long term (current) drug therapy: Secondary | ICD-10-CM | POA: Diagnosis not present

## 2023-10-10 DIAGNOSIS — Z800 Family history of malignant neoplasm of digestive organs: Secondary | ICD-10-CM | POA: Insufficient documentation

## 2023-10-10 LAB — CBC WITH DIFFERENTIAL/PLATELET
Abs Immature Granulocytes: 0.02 10*3/uL (ref 0.00–0.07)
Basophils Absolute: 0 10*3/uL (ref 0.0–0.1)
Basophils Relative: 0 %
Eosinophils Absolute: 0.2 10*3/uL (ref 0.0–0.5)
Eosinophils Relative: 2 %
HCT: 38.5 % — ABNORMAL LOW (ref 39.0–52.0)
Hemoglobin: 13.3 g/dL (ref 13.0–17.0)
Immature Granulocytes: 0 %
Lymphocytes Relative: 13 %
Lymphs Abs: 1.2 10*3/uL (ref 0.7–4.0)
MCH: 26.4 pg (ref 26.0–34.0)
MCHC: 34.5 g/dL (ref 30.0–36.0)
MCV: 76.5 fL — ABNORMAL LOW (ref 80.0–100.0)
Monocytes Absolute: 0.7 10*3/uL (ref 0.1–1.0)
Monocytes Relative: 8 %
Neutro Abs: 6.8 10*3/uL (ref 1.7–7.7)
Neutrophils Relative %: 77 %
Platelets: 289 10*3/uL (ref 150–400)
RBC: 5.03 MIL/uL (ref 4.22–5.81)
RDW: 21.2 % — ABNORMAL HIGH (ref 11.5–15.5)
WBC: 8.9 10*3/uL (ref 4.0–10.5)
nRBC: 0 % (ref 0.0–0.2)

## 2023-10-10 LAB — COMPREHENSIVE METABOLIC PANEL WITH GFR
ALT: 30 U/L (ref 0–44)
AST: 34 U/L (ref 15–41)
Albumin: 4 g/dL (ref 3.5–5.0)
Alkaline Phosphatase: 55 U/L (ref 38–126)
Anion gap: 12 (ref 5–15)
BUN: 8 mg/dL (ref 6–20)
CO2: 22 mmol/L (ref 22–32)
Calcium: 9.4 mg/dL (ref 8.9–10.3)
Chloride: 102 mmol/L (ref 98–111)
Creatinine, Ser: 0.94 mg/dL (ref 0.61–1.24)
GFR, Estimated: >60 mL/min (ref >60)
Glucose, Bld: 270 mg/dL — ABNORMAL HIGH (ref 70–99)
Potassium: 4.2 mmol/L (ref 3.5–5.1)
Sodium: 136 mmol/L (ref 135–145)
Total Bilirubin: 0.8 mg/dL (ref 0.0–1.2)
Total Protein: 7 g/dL (ref 6.5–8.1)

## 2023-10-10 LAB — MAGNESIUM: Magnesium: 1.9 mg/dL (ref 1.7–2.4)

## 2023-10-10 MED ORDER — SODIUM CHLORIDE 0.9% FLUSH
10.0000 mL | Freq: Once | INTRAVENOUS | Status: AC
  Administered 2023-10-10: 10 mL via INTRAVENOUS

## 2023-10-10 MED ORDER — HEPARIN SOD (PORK) LOCK FLUSH 100 UNIT/ML IV SOLN
500.0000 [IU] | Freq: Once | INTRAVENOUS | Status: AC
  Administered 2023-10-10: 500 [IU] via INTRAVENOUS

## 2023-10-10 NOTE — Progress Notes (Signed)
 Patients port flushed without difficulty.  Good blood return noted with no bruising or swelling noted at site.  Band aid applied.  Labs drawn per orders. VSS with discharge and left in satisfactory condition with no s/s of distress noted. All follow ups as scheduled.       Aimy Sweeting [employer]

## 2023-10-11 ENCOUNTER — Inpatient Hospital Stay

## 2023-10-11 ENCOUNTER — Inpatient Hospital Stay: Admitting: Hematology

## 2023-10-11 ENCOUNTER — Inpatient Hospital Stay: Admitting: Dietician

## 2023-10-11 VITALS — BP 149/95 | HR 92 | Resp 19

## 2023-10-11 VITALS — BP 128/91 | HR 94 | Temp 98.1°F | Resp 18 | Wt 191.8 lb

## 2023-10-11 DIAGNOSIS — C20 Malignant neoplasm of rectum: Secondary | ICD-10-CM

## 2023-10-11 DIAGNOSIS — Z5111 Encounter for antineoplastic chemotherapy: Secondary | ICD-10-CM | POA: Diagnosis not present

## 2023-10-11 LAB — CEA: CEA: 4.5 ng/mL (ref 0.0–4.7)

## 2023-10-11 MED ORDER — OXALIPLATIN CHEMO INJECTION 100 MG/20ML
130.0000 mg/m2 | Freq: Once | INTRAVENOUS | Status: AC
  Administered 2023-10-11: 250 mg via INTRAVENOUS
  Filled 2023-10-11: qty 40

## 2023-10-11 MED ORDER — HEPARIN SOD (PORK) LOCK FLUSH 100 UNIT/ML IV SOLN
500.0000 [IU] | Freq: Once | INTRAVENOUS | Status: AC | PRN
  Administered 2023-10-11: 500 [IU]

## 2023-10-11 MED ORDER — DEXAMETHASONE SODIUM PHOSPHATE 10 MG/ML IJ SOLN
10.0000 mg | Freq: Once | INTRAMUSCULAR | Status: AC
  Administered 2023-10-11: 10 mg via INTRAVENOUS
  Filled 2023-10-11: qty 1

## 2023-10-11 MED ORDER — PALONOSETRON HCL INJECTION 0.25 MG/5ML
0.2500 mg | Freq: Once | INTRAVENOUS | Status: AC
Start: 2023-10-11 — End: 2023-10-11
  Administered 2023-10-11: 0.25 mg via INTRAVENOUS
  Filled 2023-10-11: qty 5

## 2023-10-11 MED ORDER — PROCHLORPERAZINE MALEATE 10 MG PO TABS
10.0000 mg | ORAL_TABLET | Freq: Once | ORAL | Status: AC
  Administered 2023-10-11: 10 mg via ORAL

## 2023-10-11 MED ORDER — DEXTROSE 5 % IV SOLN: INTRAVENOUS | Status: DC

## 2023-10-11 NOTE — Progress Notes (Signed)
 Patient tolerated chemotherapy. Pt expressed to RN that he was nauseous when the oxaliplatin  was done. RN notified MD. Per MD and treatment team to give him Compazine  10 mg PO at this time. Side effects with management reviewed with understanding verbalized.  Port site clean and dry with no bruising or swelling noted at site.  Good blood return noted before and after administration of chemotherapy.  Band aid applied.  Patient left in satisfactory condition with VSS and no s/s of distress noted.RN educated pt on the importance of notifying the clinic if complications occur, pt verbalized understanding all questions answered. All follow ups as scheduled.   Michayla Mcneil [employer]

## 2023-10-11 NOTE — Progress Notes (Signed)
 Nutrition Assessment   Reason for Assessment: Referral (rectal cancer)   ASSESSMENT: 43 year old male with stage III recto-sigmoid adenocarcinoma. S/p robotic LAR ([facility]) on 08/23/23. Patient currently receiving adjuvant chemotherapy with CapeOx (start 4/24). Patient is under the care of Dr. Cheree Cords  Past medical history includes HTN, fatty liver, DM2, HLD, ADD, anxiety  Met with patient and wife in infusion. He reports tolerating adjuvant therapy well overall, other than fatigue. Cold sensitivity lasting ~3 days following treatment. Patient reports no change in appetite and tolerating a regular diet s/p surgery. He is eating 3 small meals. Usually eats a good breakfast (jimmy dean biscuits/bowls). Recalls peanut butter/jelly or snack wrap for lunch. Recalls meat, starch, and sometimes green beans for dinner. He does not eat many vegetables. Patient drinks fairlife protein shake occasionally. He is drinking 40-60 ounces of water. Endorses daily alcohol (~6 [employer]). Patient has had some nausea with one episode of vomiting. He is having 3-4 bowel movements. Reports stools are watery or soft. Patient stopped taking ozempic  prior to surgery. He does not check blood sugars. Patient agreeable to resuming CGM.   Nutrition Focused Physical Exam: deferred    Medications: lexapro , zestril , roxicodone, pravastatin , compazine , ozempic  , vyvanse     Labs: glucose 270   Anthropometrics:   Height: 5'9" Weight: 191 lb 12.8 oz  UBW: 180-190 lb (last 12 months) BMI: 28.32   NUTRITION DIAGNOSIS: Food and nutrition related knowledge deficit related to cancer as evidenced by no prior need for associated nutrition information    INTERVENTION:  Educated on importance of increased calorie and protein energy intake to preserve LBM Encourage high protein snacks in between meals - ideas provided  Continue fairlife shake for added protein  Discussed empty calories in alcohol, encouraged pt to reduce  daily intake  Encourage increasing daily water, recommend 64-80 ounces  Pt will monitor blood sugar with CGM - blood glucose management handout provided  Cold sensitivity handout provided  Contact information provided    MONITORING, EVALUATION, GOAL: Pt will tolerate adequate calories and protein to minimize wt loss during treatment    Next Visit: To be scheduled as needed

## 2023-10-11 NOTE — Progress Notes (Signed)
 [facility] [address] [place] [address]   Clinic Day:  10/11/2023  Referring physician: Chandler Combs, MD  Patient Care Team: Chandler Combs, MD as PCP - General (Family Medicine) [name] Windsor Hatcher, MD as Consulting Physician (Gastroenterology) Hyland Mailman, MD as Referring Physician (Optometry)   ASSESSMENT & PLAN:   Assessment:  1.  Stage III (T3 N1) recto-sigmoid colon adenocarcinoma: - Presentation: Rectal bleeding, seen by GI Dr. Aldean Amass - Colonoscopy (07/19/2023): Mild pancolonic diverticulosis.  A single sessile 1 cm nonbleeding polyp of benign appearance found in the transverse colon.  An ulcerated bleeding 8 cm mass of malignant appearance was found in the sigmoid colon.  Mass caused partial obstruction. - CEA (07/20/2023): 39.4, (09/05/2023): 10.6 - CT CAP (07/25/2023): Mass within the sigmoid colon.  Few mildly enlarged lymph nodes adjacent to the sigmoid colon.  Tiny left upper lobe pulmonary nodule, indeterminate. - S/p robotic LAR on 08/23/2023 by Dr. Ashley Blades [facility]) - Pathology: Invasive colonic adenocarcinoma, moderately differentiated arising in a tubulovillous adenoma, tumor size 6.8 x 5.6 cm.  Tumor location: Distal sigmoid/rectosigmoid colon.  1/31 lymph nodes positive.  All margins negative.  LVI-not identified.  PNI-present.  pT3 pN1a.  IHC for DNA mismatch repair proteins shows retained nuclear expression of all 4 mismatch repair proteins. - Cancer next expanded +RNA insight panel Rendell Carrel): Negative - I have called and talked to the pathologist.  Per the OR report and surgical pathology, tumor is at distal sigmoid/rectosigmoid area. - I have recommended 4 months of chemotherapy with CapeOx followed by chemoradiation with Xeloda . - Cycle 1 of CapeOx on 09/20/2023 - Dr. Ashley Blades called me during the week of 10/01/2023.  He told me that the tumor location was mostly in the distal sigmoid region.  He was very confident that he had  all the lymph nodes out and recommended that we may be able to avoid radiation.  I have recommended that we will plan for total of 6 months of chemotherapy if we are avoiding radiation.  2.  Social/family history: - Lives at home with his wife Allyson.  He works Paramedic job.  Non-smoker. - Maternal grandfather had brain tumor.  Paternal aunt had pancreatic cancer.   Plan:  1.  Stage III (T3 N1) recto-sigmoid colon adenocarcinoma, MMR preserved: - Cycle 1 of CapeOx on 09/20/2023. - He has 4-5 soft stools per day since the diagnosis which has not changed.  He tried taking an Imodium when he wanted to go out to play golf.  It has caused constipation the next day.  I have recommended that he may try taking half tablet of Imodium and see if it helps.  No significant nausea or vomiting reported. - I have discussed with him about my conversation with Dr. Ashley Blades who called me during the week of 10/01/2023 and discussed the location of the tumor but also recommended that we may forego radiation.  I have discussed with the patient and his wife who are in agreement with Dr.Mistrot's recommendations.  I have recommended that we we will plan for a total of 6 months of chemotherapy. - Labs today: Normal LFTs and creatinine.  CBC grossly normal.  CEA has normalized at 4.5. - Proceed with cycle 2 without any dose modifications.  Use Voltaren gel to the hands and feet twice daily.  Continue Xeloda  4 tablets twice daily 2 weeks on/1 week off.  RTC 3 weeks for follow-up.  2.  Peripheral neuropathy: -  He has toe numbness most of the time, which was present prior to chemotherapy, likely from his diabetes. - He experienced cold sensitivity after cycle 1 which lasted 3 to 4 days.  He reported tingling and numbness in the fingertips on and off which started this week.  Will closely monitor.  3.  Diabetes: - He has been off of Ozempic  since 2 weeks prior to surgery.  Blood sugar today is 270.  He will talk to his PMD and  start back on it at low-dose.   No orders of the defined types were placed in this encounter.     Nadeen Augusta Teague,acting as a Neurosurgeon for Randall Boros, MD.,have documented all relevant documentation on the behalf of Randall Boros, MD,as directed by  Randall Boros, MD while in the presence of Randall Boros, MD.  I, Randall Boros MD, have reviewed the above documentation for accuracy and completeness, and I agree with the above.     Randall Boros, MD   5/15/202512:03 PM  CHIEF COMPLAINT/PURPOSE OF CONSULT:   Diagnosis: sigmoid colon cancer    Cancer Staging  Rectal adenocarcinoma [facility]) Staging form: Colon and Rectum, AJCC 8th Edition - Clinical stage from 09/10/2023: Stage IIIB (cT3, cN1a, cM0) - Signed by Randall Boros, MD on 09/10/2023    Prior Therapy: Resection on 08/23/23  Current Therapy: CapeOx   HISTORY OF PRESENT ILLNESS:   Oncology History  Rectal adenocarcinoma (HCC)  09/10/2023 Initial Diagnosis   Rectal adenocarcinoma (HCC)   09/10/2023 Cancer Staging   Staging form: Colon and Rectum, AJCC 8th Edition - Clinical stage from 09/10/2023: Stage IIIB (cT3, cN1a, cM0) - Signed by Randall Boros, MD on 09/10/2023 Histopathologic type: Adenocarcinoma, NOS Stage prefix: Initial diagnosis Total positive nodes: 1 Total nodes examined: 31 Histologic grade (G): G2 Histologic grading system: 4 grade system   09/20/2023 -  Chemotherapy   Patient is on Treatment Plan : RECTAL Xelox (Capeox) (130/850) q21d x 6 cycles         Randall Murphy is a 43 y.o. male presenting to clinic today for evaluation of sigmoid colon cancer at the request of Dr. Ashley Blades.  He initially presented to his PCP on 06/19/23 with rectal bleeding. He was referred to Dr. Almeda Jacobs in GI at [facility] and underwent colonoscopy on 07/19/23. He was found to have a malignant-appearing partially-obstructing mass in sigmoid colon. He underwent staging CT C/A/P on 07/25/23  showing: 3 cm mass within sigmoid colon; few mildly enlarged lymph nodes adjacent to sigmoid colon, with index partially necrotic lymph node measuring 1.7 cm; tiny indeterminate LUL pulmonary nodule.  He was referred to colorectal surgery at [facility] and was taken for resection on 08/23/23 under Dr. Ashley Blades. Pathology confirmed 6.8 cm invasive colonic adenocarcinoma, moderately differentiated, arising in a tubulovillous adenoma. Out of 31 biopsied lymph nodes, one was positive for metastatic disease.  Today, he states that he is doing well overall. His appetite level is at 100%. His energy level is at 50%.  INTERVAL HISTORY:   JJESUS HOGGLE is a 43 y.o. male presenting to the clinic today for follow-up of sigmoid colon cancer. He was last seen by me on 09/20/23.  Today, he states that he is doing well overall. His appetite level is at 90%. His energy level is at 50%.   PAST MEDICAL HISTORY:   Past Medical History: Past Medical History:  Diagnosis Date   ADD (attention deficit disorder) 02/22/2015   Anxiety, generalized 02/15/2016   Diabetes mellitus without complication (HCC)  Dyslipidemia 02/22/2015   Elevated liver enzymes    Fatty liver 03/23/2015   Hypertension     Surgical History: Past Surgical History:  Procedure Laterality Date   IR IMAGING GUIDED PORT INSERTION  09/07/2023    Social History: Social History   Socioeconomic History   Marital status: Married    Spouse name: Not on file   Number of children: 2   Years of education: Not on file   Highest education level: Not on file  Occupational History   Occupation: IT  Tobacco Use   Smoking status: Never   Smokeless tobacco: Never  Vaping Use   Vaping status: Never Used  Substance and Sexual Activity   Alcohol use: Yes    Alcohol/week: 4.0 standard drinks of alcohol    Types: 4 Cans of beer per week    Comment: daily   Drug use: Never   Sexual activity: Yes    Birth control/protection: Condom  Other Topics  Concern   Not on file  Social History Narrative   Not on file   Social Drivers of Health   Financial Resource Strain: Low Risk  (07/20/2023)   Received from [facility]   Overall Financial Resource Strain (CARDIA)    Difficulty of Paying Living Expenses: Not very hard  Food Insecurity: No Food Insecurity (08/23/2023)   Received from [facility]   Hunger Vital Sign    Worried About Running Out of Food in the Last Year: Never true    Ran Out of Food in the Last Year: Never true  Transportation Needs: No Transportation Needs (08/23/2023)   Received from [facility] - Transportation    Lack of Transportation (Medical): No    Lack of Transportation (Non-Medical): No  Physical Activity: Insufficiently Active (07/20/2023)   Received from [facility]   Exercise Vital Sign    Days of Exercise per Week: 2 days    Minutes of Exercise per Session: 20 min  Stress: No Stress Concern Present (08/23/2023)   Received from [facility] - Occupational Stress Questionnaire    Feeling of Stress : Not at all  Social Connections: Moderately Integrated (07/20/2023)   Received from [facility]   Social Network    How would you rate your social network (family, work, friends)?: Adequate participation with social networks  Intimate Partner Violence: Not At Risk (08/23/2023)   Received from [facility]   HITS    Over the last 12 months how often did your partner physically hurt you?: Never    Over the last 12 months how often did your partner insult you or talk down to you?: Never    Over the last 12 months how often did your partner threaten you with physical harm?: Never    Over the last 12 months how often did your partner scream or curse at you?: Never    Family History: Family History  Problem Relation Age of Onset   Arthritis Mother    Diabetes Father    Hypertension Father    Stroke Father    Kidney disease Father        ESRD on  dialysis   Diabetes Sister    Hypertension Brother    Diabetes Maternal Grandmother    Brain cancer Maternal Grandfather    Heart attack Paternal Grandfather     Current Medications:  Current Outpatient Medications:    capecitabine  (XELODA ) 500 MG tablet, Take 4 tablets (2,000 mg  total) by mouth 2 (two) times daily after a meal. Take 2 weeks on/1 week off every 3 weeks x 6, Disp: 112 tablet, Rfl: 5   Continuous Blood Gluc Sensor (FREESTYLE LIBRE 3 SENSOR) MISC, 1 Device by Does not apply route every 14 (fourteen) days. Place 1 sensor on the skin every 14 days. Use to check glucose continuously, Disp: 2 each, Rfl: 2   escitalopram  (LEXAPRO ) 10 MG tablet, Take 1 tablet (10 mg total) by mouth daily. (NEEDS TO BE SEEN BEFORE NEXT REFILL), Disp: 30 tablet, Rfl: 0   lidocaine -prilocaine  (EMLA ) cream, Apply to affected area once, Disp: 30 g, Rfl: 3   lisinopril  (ZESTRIL ) 10 MG tablet, Take 1 tablet (10 mg total) by mouth daily., Disp: 90 tablet, Rfl: 3   oxyCODONE (OXY IR/ROXICODONE) 5 MG immediate release tablet, Take 5 mg by mouth every 4 (four) hours as needed., Disp: , Rfl:    pravastatin  (PRAVACHOL ) 40 MG tablet, Take 1 tablet (40 mg total) by mouth daily. (NEEDS TO BE SEEN BEFORE NEXT REFILL), Disp: 30 tablet, Rfl: 0   prochlorperazine  (COMPAZINE ) 10 MG tablet, Take 1 tablet (10 mg total) by mouth every 6 (six) hours as needed for nausea or vomiting., Disp: 60 tablet, Rfl: 3   Semaglutide , 2 MG/DOSE, (OZEMPIC , 2 MG/DOSE,) 8 MG/3ML SOPN, Inject 2 mg into the skin once a week. (NEEDS TO BE SEEN BEFORE NEXT REFILL), Disp: 3 mL, Rfl: 0   VYVANSE  40 MG capsule, Take by mouth., Disp: , Rfl:  No current facility-administered medications for this visit.  Facility-Administered Medications Ordered in Other Visits:    dextrose  5 % solution, , Intravenous, Continuous, Randall Boros, MD, Last Rate: 10 mL/hr at 10/11/23 1139, New Bag at 10/11/23 1139   heparin  lock flush 100 unit/mL, 500 Units,  Intracatheter, Once PRN, Jedadiah Abdallah, MD   oxaliplatin  (ELOXATIN ) 250 mg in dextrose  5 % 500 mL chemo infusion, 130 mg/m2 (Treatment Plan Recorded), Intravenous, Once, Randall Boros, MD   Allergies: No Known Allergies  REVIEW OF SYSTEMS:   Review of Systems  Constitutional:  Negative for chills, fatigue and fever.  HENT:   Negative for lump/mass, mouth sores, nosebleeds, sore throat and trouble swallowing.   Eyes:  Negative for eye problems.  Respiratory:  Negative for cough and shortness of breath.   Cardiovascular:  Negative for chest pain, leg swelling and palpitations.  Gastrointestinal:  Positive for diarrhea. Negative for abdominal pain, constipation, nausea and vomiting.  Genitourinary:  Negative for bladder incontinence, difficulty urinating, dysuria, frequency, hematuria and nocturia.   Musculoskeletal:  Positive for back pain. Negative for arthralgias, flank pain, myalgias and neck pain.  Skin:  Negative for itching and rash.  Neurological:  Positive for numbness. Negative for dizziness and headaches.  Hematological:  Does not bruise/bleed easily.  Psychiatric/Behavioral:  Negative for depression, sleep disturbance and suicidal ideas. The patient is not nervous/anxious.   All other systems reviewed and are negative.    VITALS:   Blood pressure (!) 128/91, pulse 94, temperature 98.1 F (36.7 C), temperature source Oral, resp. rate 18, weight 191 lb 12.8 oz (87 kg), SpO2 100%.  Wt Readings from Last 3 Encounters:  10/11/23 191 lb 12.8 oz (87 kg)  09/07/23 190 lb 2 oz (86.2 kg)  09/07/23 189 lb (85.7 kg)    Body mass index is 28.32 kg/m.  Performance status (ECOG): 0 - Asymptomatic  PHYSICAL EXAM:   Physical Exam Vitals and nursing note reviewed. Exam conducted with a chaperone present.  Constitutional:  Appearance: Normal appearance.  Cardiovascular:     Rate and Rhythm: Normal rate and regular rhythm.     Pulses: Normal pulses.     Heart  sounds: Normal heart sounds.  Pulmonary:     Effort: Pulmonary effort is normal.     Breath sounds: Normal breath sounds.  Abdominal:     Palpations: Abdomen is soft. There is no hepatomegaly, splenomegaly or mass.     Tenderness: There is no abdominal tenderness.  Musculoskeletal:     Right lower leg: No edema.     Left lower leg: No edema.  Lymphadenopathy:     Cervical: No cervical adenopathy.     Right cervical: No superficial, deep or posterior cervical adenopathy.    Left cervical: No superficial, deep or posterior cervical adenopathy.     Upper Body:     Right upper body: No supraclavicular or axillary adenopathy.     Left upper body: No supraclavicular or axillary adenopathy.  Neurological:     General: No focal deficit present.     Mental Status: He is alert and oriented to person, place, and time.  Psychiatric:        Mood and Affect: Mood normal.        Behavior: Behavior normal.   Laparoscopic surgical sites are healing well.  LABS:   CBC    Component Value Date/Time   WBC 8.9 10/10/2023 1221   RBC 5.03 10/10/2023 1221   HGB 13.3 10/10/2023 1221   HGB 14.2 05/18/2022 0938   HCT 38.5 (L) 10/10/2023 1221   HCT 42.4 05/18/2022 0938   PLT 289 10/10/2023 1221   PLT 298 05/18/2022 0938   MCV 76.5 (L) 10/10/2023 1221   MCV 81 05/18/2022 0938   MCH 26.4 10/10/2023 1221   MCHC 34.5 10/10/2023 1221   RDW 21.2 (H) 10/10/2023 1221   RDW 12.3 05/18/2022 0938   LYMPHSABS 1.2 10/10/2023 1221   LYMPHSABS 1.8 05/18/2022 0938   MONOABS 0.7 10/10/2023 1221   EOSABS 0.2 10/10/2023 1221   EOSABS 0.3 05/18/2022 0938   BASOSABS 0.0 10/10/2023 1221   BASOSABS 0.1 05/18/2022 0938    CMP    Component Value Date/Time   NA 136 10/10/2023 1221   NA 137 05/18/2022 0848   K 4.2 10/10/2023 1221   CL 102 10/10/2023 1221   CO2 22 10/10/2023 1221   GLUCOSE 270 (H) 10/10/2023 1221   BUN 8 10/10/2023 1221   BUN 6 05/18/2022 0848   CREATININE 0.94 10/10/2023 1221   CALCIUM  9.4  10/10/2023 1221   PROT 7.0 10/10/2023 1221   PROT 7.6 05/18/2022 0848   ALBUMIN 4.0 10/10/2023 1221   ALBUMIN 4.7 05/18/2022 0848   AST 34 10/10/2023 1221   ALT 30 10/10/2023 1221   ALKPHOS 55 10/10/2023 1221   BILITOT 0.8 10/10/2023 1221   BILITOT 0.8 05/18/2022 0848   GFRNONAA >60 10/10/2023 1221   GFRAA 102 01/22/2020 0924     Lab Results  Component Value Date   CEA1 4.5 10/10/2023   /  CEA  Date Value Ref Range Status  10/10/2023 4.5 0.0 - 4.7 ng/mL Final    Comment:    (NOTE)                             Nonsmokers          <3.9  Smokers             <5.6 [employer] Electrochemiluminescence Immunoassay (ECLIA) Values obtained with different assay methods or kits cannot be used interchangeably.  Results cannot be interpreted as absolute evidence of the presence or absence of malignant disease. Performed At: [facility] [address] [place], [place] [id number] Pearlean Botts MD WU:[id number]    No results found for: "PSA1" No results found for: "CAN199" No results found for: "CAN125"  No results found for: "TOTALPROTELP", "ALBUMINELP", "A1GS", "A2GS", "BETS", "BETA2SER", "GAMS", "MSPIKE", "SPEI" Lab Results  Component Value Date   TIBC 463 (H) 09/05/2023   FERRITIN 299 09/05/2023   IRONPCTSAT 29 09/05/2023   No results found for: "LDH"   STUDIES:   No results found.

## 2023-10-11 NOTE — Patient Instructions (Signed)

## 2023-10-11 NOTE — Patient Instructions (Signed)
 [facility] - A DEPT OF [facility]  Discharge Instructions: Thank you for choosing [facility] to provide your oncology and hematology care.  If you have a lab appointment with the Cancer Center - please note that after April 8th, 2024, all labs will be drawn in the cancer center.  You do not have to check in or register with the main entrance as you have in the past but will complete your check-in in the cancer center.  Wear comfortable clothing and clothing appropriate for easy access to any Portacath or PICC line.   We strive to give you quality time with your provider. You may need to reschedule your appointment if you arrive late (15 or more minutes).  Arriving late affects you and other patients whose appointments are after yours.  Also, if you miss three or more appointments without notifying the office, you may be dismissed from the clinic at the provider's discretion.      For prescription refill requests, have your pharmacy contact our office and allow 72 hours for refills to be completed.    Today you received the following chemotherapy and/or immunotherapy agents Oxaliplatin      To help prevent nausea and vomiting after your treatment, we encourage you to take your nausea medication as directed.  BELOW ARE SYMPTOMS THAT SHOULD BE REPORTED IMMEDIATELY: *FEVER GREATER THAN 100.4 F (38 C) OR HIGHER *CHILLS OR SWEATING *NAUSEA AND VOMITING THAT IS NOT CONTROLLED WITH YOUR NAUSEA MEDICATION *UNUSUAL SHORTNESS OF BREATH *UNUSUAL BRUISING OR BLEEDING *URINARY PROBLEMS (pain or burning when urinating, or frequent urination) *BOWEL PROBLEMS (unusual diarrhea, constipation, pain near the anus) TENDERNESS IN MOUTH AND THROAT WITH OR WITHOUT PRESENCE OF ULCERS (sore throat, sores in mouth, or a toothache) UNUSUAL RASH, SWELLING OR PAIN  UNUSUAL VAGINAL DISCHARGE OR ITCHING   Items with * indicate a potential emergency and should be followed  up as soon as possible or go to the Emergency Department if any problems should occur.  Please show the CHEMOTHERAPY ALERT CARD or IMMUNOTHERAPY ALERT CARD at check-in to the Emergency Department and triage nurse.  Should you have questions after your visit or need to cancel or reschedule your appointment, please contact [facility] [place] - A DEPT OF Eligha Bridegroom [facility] [phone]  and follow the prompts.  Office hours are 8:00 a.m. to 4:30 p.m. Monday - Friday. Please note that voicemails left after 4:00 p.m. may not be returned until the following business day.  We are closed weekends and major holidays. You have access to a nurse at all times for urgent questions. Please call the main number to the clinic [phone] and follow the prompts.  For any non-urgent questions, you may also contact your provider using MyChart. We now offer e-Visits for anyone 60 and older to request care online for non-urgent symptoms. For details visit [email or web address].   Also download the MyChart app! Go to the app store, search "MyChart", open the app, select [place], and log in with your MyChart username and password.

## 2023-10-11 NOTE — Progress Notes (Signed)
 Patient has been examined by Dr. Ellin Saba. Vital signs and labs have been reviewed by MD - ANC, Creatinine, LFTs, hemoglobin, and platelets are within treatment parameters per M.D. - pt may proceed with treatment.  Primary RN and pharmacy notified.

## 2023-10-12 ENCOUNTER — Other Ambulatory Visit: Payer: Self-pay

## 2023-10-15 ENCOUNTER — Other Ambulatory Visit: Payer: Self-pay

## 2023-10-26 ENCOUNTER — Other Ambulatory Visit: Payer: Self-pay

## 2023-11-01 ENCOUNTER — Inpatient Hospital Stay

## 2023-11-01 ENCOUNTER — Inpatient Hospital Stay: Attending: Hematology

## 2023-11-01 ENCOUNTER — Inpatient Hospital Stay: Admitting: Hematology

## 2023-11-01 VITALS — BP 124/82 | HR 67 | Temp 98.4°F | Resp 18

## 2023-11-01 DIAGNOSIS — K649 Unspecified hemorrhoids: Secondary | ICD-10-CM | POA: Insufficient documentation

## 2023-11-01 DIAGNOSIS — E119 Type 2 diabetes mellitus without complications: Secondary | ICD-10-CM | POA: Diagnosis not present

## 2023-11-01 DIAGNOSIS — Z808 Family history of malignant neoplasm of other organs or systems: Secondary | ICD-10-CM | POA: Insufficient documentation

## 2023-11-01 DIAGNOSIS — Z823 Family history of stroke: Secondary | ICD-10-CM | POA: Insufficient documentation

## 2023-11-01 DIAGNOSIS — C20 Malignant neoplasm of rectum: Secondary | ICD-10-CM

## 2023-11-01 DIAGNOSIS — C19 Malignant neoplasm of rectosigmoid junction: Secondary | ICD-10-CM | POA: Insufficient documentation

## 2023-11-01 DIAGNOSIS — I10 Essential (primary) hypertension: Secondary | ICD-10-CM | POA: Diagnosis not present

## 2023-11-01 DIAGNOSIS — Z79630 Long term (current) use of alkylating agent: Secondary | ICD-10-CM | POA: Diagnosis not present

## 2023-11-01 DIAGNOSIS — Z5111 Encounter for antineoplastic chemotherapy: Secondary | ICD-10-CM | POA: Diagnosis present

## 2023-11-01 DIAGNOSIS — K5900 Constipation, unspecified: Secondary | ICD-10-CM | POA: Diagnosis not present

## 2023-11-01 DIAGNOSIS — Z79899 Other long term (current) drug therapy: Secondary | ICD-10-CM | POA: Diagnosis not present

## 2023-11-01 DIAGNOSIS — G620 Drug-induced polyneuropathy: Secondary | ICD-10-CM | POA: Insufficient documentation

## 2023-11-01 DIAGNOSIS — T451X5A Adverse effect of antineoplastic and immunosuppressive drugs, initial encounter: Secondary | ICD-10-CM | POA: Diagnosis not present

## 2023-11-01 DIAGNOSIS — K5730 Diverticulosis of large intestine without perforation or abscess without bleeding: Secondary | ICD-10-CM | POA: Diagnosis not present

## 2023-11-01 DIAGNOSIS — K760 Fatty (change of) liver, not elsewhere classified: Secondary | ICD-10-CM | POA: Diagnosis not present

## 2023-11-01 DIAGNOSIS — Z8261 Family history of arthritis: Secondary | ICD-10-CM | POA: Diagnosis not present

## 2023-11-01 DIAGNOSIS — Z8249 Family history of ischemic heart disease and other diseases of the circulatory system: Secondary | ICD-10-CM | POA: Insufficient documentation

## 2023-11-01 DIAGNOSIS — Z833 Family history of diabetes mellitus: Secondary | ICD-10-CM | POA: Insufficient documentation

## 2023-11-01 DIAGNOSIS — Z800 Family history of malignant neoplasm of digestive organs: Secondary | ICD-10-CM | POA: Insufficient documentation

## 2023-11-01 LAB — CBC WITH DIFFERENTIAL/PLATELET
Abs Immature Granulocytes: 0.01 10*3/uL (ref 0.00–0.07)
Basophils Absolute: 0 10*3/uL (ref 0.0–0.1)
Basophils Relative: 1 %
Eosinophils Absolute: 0.2 10*3/uL (ref 0.0–0.5)
Eosinophils Relative: 6 %
HCT: 36.9 % — ABNORMAL LOW (ref 39.0–52.0)
Hemoglobin: 12.7 g/dL — ABNORMAL LOW (ref 13.0–17.0)
Immature Granulocytes: 0 %
Lymphocytes Relative: 44 %
Lymphs Abs: 1.7 10*3/uL (ref 0.7–4.0)
MCH: 28.3 pg (ref 26.0–34.0)
MCHC: 34.4 g/dL (ref 30.0–36.0)
MCV: 82.4 fL (ref 80.0–100.0)
Monocytes Absolute: 0.5 10*3/uL (ref 0.1–1.0)
Monocytes Relative: 13 %
Neutro Abs: 1.4 10*3/uL — ABNORMAL LOW (ref 1.7–7.7)
Neutrophils Relative %: 36 %
Platelets: 223 10*3/uL (ref 150–400)
RBC: 4.48 MIL/uL (ref 4.22–5.81)
RDW: 23.1 % — ABNORMAL HIGH (ref 11.5–15.5)
WBC: 3.9 10*3/uL — ABNORMAL LOW (ref 4.0–10.5)
nRBC: 0 % (ref 0.0–0.2)

## 2023-11-01 LAB — COMPREHENSIVE METABOLIC PANEL WITH GFR
ALT: 29 U/L (ref 0–44)
AST: 29 U/L (ref 15–41)
Albumin: 4 g/dL (ref 3.5–5.0)
Alkaline Phosphatase: 65 U/L (ref 38–126)
Anion gap: 9 (ref 5–15)
BUN: 15 mg/dL (ref 6–20)
CO2: 23 mmol/L (ref 22–32)
Calcium: 9.1 mg/dL (ref 8.9–10.3)
Chloride: 101 mmol/L (ref 98–111)
Creatinine, Ser: 0.84 mg/dL (ref 0.61–1.24)
GFR, Estimated: >60 mL/min (ref >60)
Glucose, Bld: 184 mg/dL — ABNORMAL HIGH (ref 70–99)
Potassium: 3.6 mmol/L (ref 3.5–5.1)
Sodium: 133 mmol/L — ABNORMAL LOW (ref 135–145)
Total Bilirubin: 1 mg/dL (ref 0.0–1.2)
Total Protein: 7.2 g/dL (ref 6.5–8.1)

## 2023-11-01 LAB — MAGNESIUM: Magnesium: 1.8 mg/dL (ref 1.7–2.4)

## 2023-11-01 MED ORDER — DEXTROSE 5 % IV SOLN: INTRAVENOUS | Status: DC

## 2023-11-01 MED ORDER — SODIUM CHLORIDE 0.9% FLUSH
10.0000 mL | Freq: Once | INTRAVENOUS | Status: AC
  Administered 2023-11-01: 10 mL via INTRAVENOUS

## 2023-11-01 MED ORDER — DEXAMETHASONE SODIUM PHOSPHATE 10 MG/ML IJ SOLN
10.0000 mg | Freq: Once | INTRAMUSCULAR | Status: AC
  Administered 2023-11-01: 10 mg via INTRAVENOUS
  Filled 2023-11-01: qty 1

## 2023-11-01 MED ORDER — OXALIPLATIN CHEMO INJECTION 100 MG/20ML
130.0000 mg/m2 | Freq: Once | INTRAVENOUS | Status: AC
  Administered 2023-11-01: 250 mg via INTRAVENOUS
  Filled 2023-11-01: qty 40

## 2023-11-01 MED ORDER — HEPARIN SOD (PORK) LOCK FLUSH 100 UNIT/ML IV SOLN
500.0000 [IU] | Freq: Once | INTRAVENOUS | Status: AC | PRN
  Administered 2023-11-01: 500 [IU]

## 2023-11-01 MED ORDER — SODIUM CHLORIDE 0.9% FLUSH
10.0000 mL | INTRAVENOUS | Status: DC | PRN
  Administered 2023-11-01: 10 mL

## 2023-11-01 MED ORDER — PALONOSETRON HCL INJECTION 0.25 MG/5ML
0.2500 mg | Freq: Once | INTRAVENOUS | Status: AC
  Administered 2023-11-01: 0.25 mg via INTRAVENOUS
  Filled 2023-11-01: qty 5

## 2023-11-01 NOTE — Progress Notes (Signed)
 [facility] [address] [place] [address]   Clinic Day:  11/01/2023  Referring physician: Chandler Combs, MD  Patient Care Team: Chandler Combs, MD as PCP - General (Family Medicine) [name] Windsor Hatcher, MD as Consulting Physician (Gastroenterology) Hyland Mailman, MD as Referring Physician (Optometry)   ASSESSMENT & PLAN:   Assessment:  1.  Stage III (T3 N1) recto-sigmoid colon adenocarcinoma: - Presentation: Rectal bleeding, seen by GI Dr. Aldean Amass - Colonoscopy (07/19/2023): Mild pancolonic diverticulosis.  A single sessile 1 cm nonbleeding polyp of benign appearance found in the transverse colon.  An ulcerated bleeding 8 cm mass of malignant appearance was found in the sigmoid colon.  Mass caused partial obstruction. - CEA (07/20/2023): 39.4, (09/05/2023): 10.6 - CT CAP (07/25/2023): Mass within the sigmoid colon.  Few mildly enlarged lymph nodes adjacent to the sigmoid colon.  Tiny left upper lobe pulmonary nodule, indeterminate. - S/p robotic LAR on 08/23/2023 by Dr. Ashley Blades [facility] health) - Pathology: Invasive colonic adenocarcinoma, moderately differentiated arising in a tubulovillous adenoma, tumor size 6.8 x 5.6 cm.  Tumor location: Distal sigmoid/rectosigmoid colon.  1/31 lymph nodes positive.  All margins negative.  LVI-not identified.  PNI-present.  pT3 pN1a.  IHC for DNA mismatch repair proteins shows retained nuclear expression of all 4 mismatch repair proteins. - Cancer next expanded +RNA insight panel Rendell Carrel): Negative - I have called and talked to the pathologist.  Per the OR report and surgical pathology, tumor is at distal sigmoid/rectosigmoid area. - I have recommended 4 months of chemotherapy with CapeOx followed by chemoradiation with Xeloda . - Cycle 1 of CapeOx on 09/20/2023 - Dr. Ashley Blades called me during the week of 10/01/2023.  He told me that the tumor location was mostly in the distal sigmoid region.  He was very confident that he had all  the lymph nodes out and recommended that we may be able to avoid radiation.  I have recommended that we will plan for total of 6 months of chemotherapy if we are avoiding radiation.  2.  Social/family history: - Lives at home with his wife Allyson.  He works Paramedic job.  Non-smoker. - Maternal grandfather had brain tumor.  Paternal aunt had pancreatic cancer.   Plan:  1.  Stage III (T3 N1) recto-sigmoid colon adenocarcinoma, MMR preserved: - He had cycle 2 of CapeOx on 10/11/2023. - He had 4-5 soft stools per day, although 2 weeks he takes Xeloda .  He is taking up to 1 Imodium per day as needed.  He is afraid he would get constipated if he takes more. - He also reported decrease in energy levels but is able to do his day-to-day activities. - Reviewed labs today: LFTs and creatinine normal.  Glucose is 184.  White count 3.9 and neutrophil count 1.4.  Platelet count is 223. - Will send Signatera test today. - He will proceed with cycle 3 without any dose modification.  RTC 3 weeks for follow-up.  If fatigue becomes concerning, may decrease the dose of Xeloda .  2.  Peripheral neuropathy: - He has toe numbness most of the time which was present prior to chemotherapy, stable. - He has cold sensitivity lasting about 5 days.  No numbness in the fingertips noted.  3.  Diabetes: - Ozempic  on hold until he finishes chemotherapy.   No orders of the defined types were placed in this encounter.     Nadeen Augusta Teague,acting as a Neurosurgeon for Paulett Boros, MD.,have documented all  relevant documentation on the behalf of Paulett Boros, MD,as directed by  Paulett Boros, MD while in the presence of Paulett Boros, MD.  I, Paulett Boros MD, have reviewed the above documentation for accuracy and completeness, and I agree with the above.     Paulett Boros, MD   6/5/20259:48 AM  CHIEF COMPLAINT/PURPOSE OF CONSULT:   Diagnosis: sigmoid colon cancer    Cancer  Staging  Rectal adenocarcinoma [facility]) Staging form: Colon and Rectum, AJCC 8th Edition - Clinical stage from 09/10/2023: Stage IIIB (cT3, cN1a, cM0) - Signed by Paulett Boros, MD on 09/10/2023    Prior Therapy: Resection on 08/23/23  Current Therapy: CapeOx   HISTORY OF PRESENT ILLNESS:   Oncology History  Rectal adenocarcinoma (HCC)  09/10/2023 Initial Diagnosis   Rectal adenocarcinoma (HCC)   09/10/2023 Cancer Staging   Staging form: Colon and Rectum, AJCC 8th Edition - Clinical stage from 09/10/2023: Stage IIIB (cT3, cN1a, cM0) - Signed by Paulett Boros, MD on 09/10/2023 Histopathologic type: Adenocarcinoma, NOS Stage prefix: Initial diagnosis Total positive nodes: 1 Total nodes examined: 31 Histologic grade (G): G2 Histologic grading system: 4 grade system   09/20/2023 -  Chemotherapy   Patient is on Treatment Plan : RECTAL Xelox (Capeox) (130/850) q21d x 6 cycles         Jariah is a 43 y.o. male presenting to clinic today for evaluation of sigmoid colon cancer at the request of Dr. Ashley Blades.  He initially presented to his PCP on 06/19/23 with rectal bleeding. He was referred to Dr. Almeda Jacobs in GI at [facility] and underwent colonoscopy on 07/19/23. He was found to have a malignant-appearing partially-obstructing mass in sigmoid colon. He underwent staging CT C/A/P on 07/25/23 showing: 3 cm mass within sigmoid colon; few mildly enlarged lymph nodes adjacent to sigmoid colon, with index partially necrotic lymph node measuring 1.7 cm; tiny indeterminate LUL pulmonary nodule.  He was referred to colorectal surgery at [facility] and was taken for resection on 08/23/23 under Dr. Ashley Blades. Pathology confirmed 6.8 cm invasive colonic adenocarcinoma, moderately differentiated, arising in a tubulovillous adenoma. Out of 31 biopsied lymph nodes, one was positive for metastatic disease.  Today, he states that he is doing well overall. His appetite level is at 100%. His energy level is at  50%.  INTERVAL HISTORY:   KAYSHAWN OZBURN is a 43 y.o. male presenting to the clinic today for follow-up of sigmoid colon cancer. He was last seen by me on 10/11/23.  Today, he states that he is doing well overall. His appetite level is at 100%. His energy level is at 25%. Spenser is accompanied by his wife.   He reports 2 weeks of diarrhea 4-5 x a day with soft stools that he is effectively treating with 1 pill of Imodium a day when diarrhea is severe. He attributes diarrhea to chemotherapy pills and hemorrhoids. He states peripheral neuropathy in the toes is stable. Dawon experienced 5 days of cold sensitivity after treatment. He denies any mouth sores or redness/blistering on the palms of the hands and soles of the feet.   Chung has not started Ozempic  yet, as his dietician was hesitant to start this and cause weight loss.   He notes consistently low energy levels and  fatigue. He reports having one day of normal energy levels intermittently followed by several days of worsened fatigue.   PAST MEDICAL HISTORY:   Past Medical History: Past Medical History:  Diagnosis Date   ADD (attention deficit disorder) 02/22/2015   Anxiety,  generalized 02/15/2016   Diabetes mellitus without complication (HCC)    Dyslipidemia 02/22/2015   Elevated liver enzymes    Fatty liver 03/23/2015   Hypertension     Surgical History: Past Surgical History:  Procedure Laterality Date   IR IMAGING GUIDED PORT INSERTION  09/07/2023    Social History: Social History   Socioeconomic History   Marital status: Married    Spouse name: Not on file   Number of children: 2   Years of education: Not on file   Highest education level: Not on file  Occupational History   Occupation: IT  Tobacco Use   Smoking status: Never   Smokeless tobacco: Never  Vaping Use   Vaping status: Never Used  Substance and Sexual Activity   Alcohol use: Yes    Alcohol/week: 4.0 standard drinks of alcohol    Types: 4 Cans of beer  per week    Comment: daily   Drug use: Never   Sexual activity: Yes    Birth control/protection: Condom  Other Topics Concern   Not on file  Social History Narrative   Not on file   Social Drivers of Health   Financial Resource Strain: Low Risk  (07/20/2023)   Received from [facility]   Overall Financial Resource Strain (CARDIA)    Difficulty of Paying Living Expenses: Not very hard  Food Insecurity: No Food Insecurity (08/23/2023)   Received from [facility]   Hunger Vital Sign    Worried About Running Out of Food in the Last Year: Never true    Ran Out of Food in the Last Year: Never true  Transportation Needs: No Transportation Needs (08/23/2023)   Received from [facility] - Transportation    Lack of Transportation (Medical): No    Lack of Transportation (Non-Medical): No  Physical Activity: Insufficiently Active (07/20/2023)   Received [facility]   Exercise Vital Sign    Days of Exercise per Week: 2 days    Minutes of Exercise per Session: 20 min  Stress: No Stress Concern Present (08/23/2023)   Received from [facility] - Occupational Stress Questionnaire    Feeling of Stress : Not at all  Social Connections: Moderately Integrated (07/20/2023)   Received from [facility]   Social Network    How would you rate your social network (family, work, friends)?: Adequate participation with social networks  Intimate Partner Violence: Not At Risk (08/23/2023)   Received from [facility]   HITS    Over the last 12 months how often did your partner physically hurt you?: Never    Over the last 12 months how often did your partner insult you or talk down to you?: Never    Over the last 12 months how often did your partner threaten you with physical harm?: Never    Over the last 12 months how often did your partner scream or curse at you?: Never    Family History: Family History  Problem Relation Age of Onset    Arthritis Mother    Diabetes Father    Hypertension Father    Stroke Father    Kidney disease Father        ESRD on dialysis   Diabetes Sister    Hypertension Brother    Diabetes Maternal Grandmother    Brain cancer Maternal Grandfather    Heart attack Paternal Grandfather     Current Medications:  Current Outpatient Medications:  capecitabine  (XELODA ) 500 MG tablet, Take 4 tablets (2,000 mg total) by mouth 2 (two) times daily after a meal. Take 2 weeks on/1 week off every 3 weeks x 6, Disp: 112 tablet, Rfl: 5   Continuous Blood Gluc Sensor (FREESTYLE LIBRE 3 SENSOR) MISC, 1 Device by Does not apply route every 14 (fourteen) days. Place 1 sensor on the skin every 14 days. Use to check glucose continuously, Disp: 2 each, Rfl: 2   escitalopram  (LEXAPRO ) 10 MG tablet, Take 1 tablet (10 mg total) by mouth daily. (NEEDS TO BE SEEN BEFORE NEXT REFILL), Disp: 30 tablet, Rfl: 0   lidocaine -prilocaine  (EMLA ) cream, Apply to affected area once, Disp: 30 g, Rfl: 3   lisinopril  (ZESTRIL ) 10 MG tablet, Take 1 tablet (10 mg total) by mouth daily., Disp: 90 tablet, Rfl: 3   oxyCODONE (OXY IR/ROXICODONE) 5 MG immediate release tablet, Take 5 mg by mouth every 4 (four) hours as needed., Disp: , Rfl:    pravastatin  (PRAVACHOL ) 40 MG tablet, Take 1 tablet (40 mg total) by mouth daily. (NEEDS TO BE SEEN BEFORE NEXT REFILL), Disp: 30 tablet, Rfl: 0   prochlorperazine  (COMPAZINE ) 10 MG tablet, Take 1 tablet (10 mg total) by mouth every 6 (six) hours as needed for nausea or vomiting., Disp: 60 tablet, Rfl: 3   Semaglutide , 2 MG/DOSE, (OZEMPIC , 2 MG/DOSE,) 8 MG/3ML SOPN, Inject 2 mg into the skin once a week. (NEEDS TO BE SEEN BEFORE NEXT REFILL), Disp: 3 mL, Rfl: 0   VYVANSE  40 MG capsule, Take by mouth., Disp: , Rfl:    Allergies: No Known Allergies  REVIEW OF SYSTEMS:   Review of Systems  Constitutional:  Positive for fatigue. Negative for chills and fever.  HENT:   Negative for lump/mass, mouth  sores, nosebleeds, sore throat and trouble swallowing.   Eyes:  Negative for eye problems.  Respiratory:  Negative for cough and shortness of breath.   Cardiovascular:  Negative for chest pain, leg swelling and palpitations.  Gastrointestinal:  Positive for diarrhea. Negative for abdominal pain, constipation, nausea and vomiting.  Genitourinary:  Negative for bladder incontinence, difficulty urinating, dysuria, frequency, hematuria and nocturia.   Musculoskeletal:  Negative for arthralgias, back pain, flank pain, myalgias and neck pain.  Skin:  Negative for itching and rash.  Neurological:  Negative for dizziness, headaches and numbness.  Hematological:  Does not bruise/bleed easily.  Psychiatric/Behavioral:  Negative for depression, sleep disturbance and suicidal ideas. The patient is not nervous/anxious.   All other systems reviewed and are negative.    VITALS:   There were no vitals taken for this visit.  Wt Readings from Last 3 Encounters:  11/01/23 189 lb (85.7 kg)  10/11/23 191 lb 12.8 oz (87 kg)  09/07/23 190 lb 2 oz (86.2 kg)    There is no height or weight on file to calculate BMI.  Performance status (ECOG): 0 - Asymptomatic  PHYSICAL EXAM:   Physical Exam Vitals and nursing note reviewed. Exam conducted with a chaperone present.  Constitutional:      Appearance: Normal appearance.  Cardiovascular:     Rate and Rhythm: Normal rate and regular rhythm.     Pulses: Normal pulses.     Heart sounds: Normal heart sounds.  Pulmonary:     Effort: Pulmonary effort is normal.     Breath sounds: Normal breath sounds.  Abdominal:     Palpations: Abdomen is soft. There is no hepatomegaly, splenomegaly or mass.     Tenderness: There is no  abdominal tenderness.  Musculoskeletal:     Right lower leg: No edema.     Left lower leg: No edema.  Lymphadenopathy:     Cervical: No cervical adenopathy.     Right cervical: No superficial, deep or posterior cervical adenopathy.     Left cervical: No superficial, deep or posterior cervical adenopathy.     Upper Body:     Right upper body: No supraclavicular or axillary adenopathy.     Left upper body: No supraclavicular or axillary adenopathy.  Neurological:     General: No focal deficit present.     Mental Status: He is alert and oriented to person, place, and time.  Psychiatric:        Mood and Affect: Mood normal.        Behavior: Behavior normal.   Laparoscopic surgical sites are healing well.  LABS:   CBC    Component Value Date/Time   WBC 3.9 (L) 11/01/2023 0817   RBC 4.48 11/01/2023 0817   HGB 12.7 (L) 11/01/2023 0817   HGB 14.2 05/18/2022 0938   HCT 36.9 (L) 11/01/2023 0817   HCT 42.4 05/18/2022 0938   PLT 223 11/01/2023 0817   PLT 298 05/18/2022 0938   MCV 82.4 11/01/2023 0817   MCV 81 05/18/2022 0938   MCH 28.3 11/01/2023 0817   MCHC 34.4 11/01/2023 0817   RDW 23.1 (H) 11/01/2023 0817   RDW 12.3 05/18/2022 0938   LYMPHSABS 1.7 11/01/2023 0817   LYMPHSABS 1.8 05/18/2022 0938   MONOABS 0.5 11/01/2023 0817   EOSABS 0.2 11/01/2023 0817   EOSABS 0.3 05/18/2022 0938   BASOSABS 0.0 11/01/2023 0817   BASOSABS 0.1 05/18/2022 0938    CMP    Component Value Date/Time   NA 133 (L) 11/01/2023 0817   NA 137 05/18/2022 0848   K 3.6 11/01/2023 0817   CL 101 11/01/2023 0817   CO2 23 11/01/2023 0817   GLUCOSE 184 (H) 11/01/2023 0817   BUN 15 11/01/2023 0817   BUN 6 05/18/2022 0848   CREATININE 0.84 11/01/2023 0817   CALCIUM  9.1 11/01/2023 0817   PROT 7.2 11/01/2023 0817   PROT 7.6 05/18/2022 0848   ALBUMIN 4.0 11/01/2023 0817   ALBUMIN 4.7 05/18/2022 0848   AST 29 11/01/2023 0817   ALT 29 11/01/2023 0817   ALKPHOS 65 11/01/2023 0817   BILITOT 1.0 11/01/2023 0817   BILITOT 0.8 05/18/2022 0848   GFRNONAA >60 11/01/2023 0817   GFRAA 102 01/22/2020 0924     Lab Results  Component Value Date   CEA1 4.5 10/10/2023   /  CEA  Date Value Ref Range Status  10/10/2023 4.5 0.0 - 4.7 ng/mL  Final    Comment:    (NOTE)                             Nonsmokers          <3.9                             Smokers             <5.6 Roche [employer] Electrochemiluminescence Immunoassay (ECLIA) Values obtained with different assay methods or kits cannot be used interchangeably.  Results cannot be interpreted as absolute evidence of the presence or absence of malignant disease. Performed At: [facility] [address] [place], [place] [id number] Pearlean Botts MD WU:[id number]  No results found for: "PSA1" No results found for: "NFA213" No results found for: "CAN125"  No results found for: "TOTALPROTELP", "ALBUMINELP", "A1GS", "A2GS", "BETS", "BETA2SER", "GAMS", "MSPIKE", "SPEI" Lab Results  Component Value Date   TIBC 463 (H) 09/05/2023   FERRITIN 299 09/05/2023   IRONPCTSAT 29 09/05/2023   No results found for: "LDH"   STUDIES:   No results found.

## 2023-11-01 NOTE — Addendum Note (Signed)
 Addended by: Lorena Rolling on: 11/01/2023 10:08 AM   Modules accepted: Orders

## 2023-11-01 NOTE — Progress Notes (Signed)
Patient has been assessed, vital signs and labs have been reviewed by Dr. Katragadda. ANC, Creatinine, LFTs, and Platelets are within treatment parameters per Dr. Katragadda. The patient is good to proceed with treatment at this time. Primary RN and pharmacy aware.  

## 2023-11-01 NOTE — Patient Instructions (Signed)
 [facility] [place] - A DEPT OF [facility]  Discharge Instructions: Thank you for choosing [facility] to provide your oncology and hematology care.  If you have a lab appointment with the Cancer Center - please note that after April 8th, 2024, all labs will be drawn in the cancer center.  You do not have to check in or register with the main entrance as you have in the past but will complete your check-in in the cancer center.  Wear comfortable clothing and clothing appropriate for easy access to any Portacath or PICC line.   We strive to give you quality time with your provider. You may need to reschedule your appointment if you arrive late (15 or more minutes).  Arriving late affects you and other patients whose appointments are after yours.  Also, if you miss three or more appointments without notifying the office, you may be dismissed from the clinic at the provider's discretion.      For prescription refill requests, have your pharmacy contact our office and allow 72 hours for refills to be completed.    Today you received the following chemotherapy and/or immunotherapy agents Oxaliplatin , return as scheduled.   To help prevent nausea and vomiting after your treatment, we encourage you to take your nausea medication as directed.  BELOW ARE SYMPTOMS THAT SHOULD BE REPORTED IMMEDIATELY: *FEVER GREATER THAN 100.4 F (38 C) OR HIGHER *CHILLS OR SWEATING *NAUSEA AND VOMITING THAT IS NOT CONTROLLED WITH YOUR NAUSEA MEDICATION *UNUSUAL SHORTNESS OF BREATH *UNUSUAL BRUISING OR BLEEDING *URINARY PROBLEMS (pain or burning when urinating, or frequent urination) *BOWEL PROBLEMS (unusual diarrhea, constipation, pain near the anus) TENDERNESS IN MOUTH AND THROAT WITH OR WITHOUT PRESENCE OF ULCERS (sore throat, sores in mouth, or a toothache) UNUSUAL RASH, SWELLING OR PAIN  UNUSUAL VAGINAL DISCHARGE OR ITCHING   Items with * indicate a potential emergency and  should be followed up as soon as possible or go to the Emergency Department if any problems should occur.  Please show the CHEMOTHERAPY ALERT CARD or IMMUNOTHERAPY ALERT CARD at check-in to the Emergency Department and triage nurse.  Should you have questions after your visit or need to cancel or reschedule your appointment, please contact [facility] [place] - A DEPT OF [facility] [phone]  and follow the prompts.  Office hours are 8:00 a.m. to 4:30 p.m. Monday - Friday. Please note that voicemails left after 4:00 p.m. may not be returned until the following business day.  We are closed weekends and major holidays. You have access to a nurse at all times for urgent questions. Please call the main number to the clinic [phone] and follow the prompts.  For any non-urgent questions, you may also contact your provider using MyChart. We now offer e-Visits for anyone 66 and older to request care online for non-urgent symptoms. For details visit [email or web address].   Also download the MyChart app! Go to the app store, search "MyChart", open the app, select [place], and log in with your MyChart username and password.

## 2023-11-01 NOTE — Patient Instructions (Signed)
 [facility]  Discharge Instructions  You were seen and examined today by Dr. Cheree Cords.  Dr. Cheree Cords discussed your most recent lab work which revealed that everything is stable. You will receive your treatment today. We will order signatera today.  Follow-up as scheduled.    Thank you for choosing [facility] to provide your oncology and hematology care.   To afford each patient quality time with our provider, please arrive at least 15 minutes before your scheduled appointment time. You may need to reschedule your appointment if you arrive late (10 or more minutes). Arriving late affects you and other patients whose appointments are after yours.  Also, if you miss three or more appointments without notifying the office, you may be dismissed from the clinic at the provider's discretion.    Again, thank you for choosing [facility].  Our hope is that these requests will decrease the amount of time that you wait before being seen by our physicians.   If you have a lab appointment with the Cancer Center - please note that after April 8th, all labs will be drawn in the cancer center.  You do not have to check in or register with the main entrance as you have in the past but will complete your check-in at the cancer center.            _____________________________________________________________  Should you have questions after your visit to [facility], please contact our office at [phone] and follow the prompts.  Our office hours are 8:00 a.m. to 4:30 p.m. Monday - Thursday and 8:00 a.m. to 2:30 p.m. Friday.  Please note that voicemails left after 4:00 p.m. may not be returned until the following business day.  We are closed weekends and all major holidays.  You do have access to a nurse 24-7, just call the main number to the clinic [phone] and do not press any options, hold on the line and a nurse will  answer the phone.    For prescription refill requests, have your pharmacy contact our office and allow 72 hours.    Masks are no longer required in the cancer centers. If you would like for your care team to wear a mask while they are taking care of you, please let them know. You may have one support person who is at least 45 years old accompany you for your appointments.

## 2023-11-01 NOTE — Progress Notes (Signed)
 Patient presents today for Oxaliplatin , ANC 1.4 patient okay for treatment per Dr. Cheree Cords. Patient tolerated chemotherapy with no complaints voiced. Side effects with management reviewed understanding verbalized. Port site clean and dry with no bruising or swelling noted at site. Good blood return noted before and after administration of chemotherapy. Band aid applied. Patient left in satisfactory condition with VSS and no s/s of distress noted.

## 2023-11-02 ENCOUNTER — Other Ambulatory Visit: Payer: Self-pay

## 2023-11-09 ENCOUNTER — Other Ambulatory Visit: Payer: Self-pay

## 2023-11-20 ENCOUNTER — Other Ambulatory Visit: Payer: Self-pay

## 2023-11-22 ENCOUNTER — Inpatient Hospital Stay: Admitting: Hematology

## 2023-11-22 ENCOUNTER — Inpatient Hospital Stay

## 2023-11-22 VITALS — BP 121/88 | HR 72 | Temp 97.2°F | Resp 18 | Wt 187.0 lb

## 2023-11-22 VITALS — BP 116/76 | HR 77 | Temp 98.0°F | Resp 16

## 2023-11-22 DIAGNOSIS — C20 Malignant neoplasm of rectum: Secondary | ICD-10-CM

## 2023-11-22 DIAGNOSIS — Z95828 Presence of other vascular implants and grafts: Secondary | ICD-10-CM

## 2023-11-22 DIAGNOSIS — D702 Other drug-induced agranulocytosis: Secondary | ICD-10-CM | POA: Insufficient documentation

## 2023-11-22 DIAGNOSIS — Z5111 Encounter for antineoplastic chemotherapy: Secondary | ICD-10-CM | POA: Diagnosis not present

## 2023-11-22 LAB — COMPREHENSIVE METABOLIC PANEL WITH GFR
ALT: 27 U/L (ref 0–44)
AST: 34 U/L (ref 15–41)
Albumin: 4 g/dL (ref 3.5–5.0)
Alkaline Phosphatase: 62 U/L (ref 38–126)
Anion gap: 13 (ref 5–15)
BUN: 12 mg/dL (ref 6–20)
CO2: 21 mmol/L — ABNORMAL LOW (ref 22–32)
Calcium: 9.3 mg/dL (ref 8.9–10.3)
Chloride: 103 mmol/L (ref 98–111)
Creatinine, Ser: 0.83 mg/dL (ref 0.61–1.24)
GFR, Estimated: >60 mL/min
Glucose, Bld: 169 mg/dL — ABNORMAL HIGH (ref 70–99)
Potassium: 4.1 mmol/L (ref 3.5–5.1)
Sodium: 137 mmol/L (ref 135–145)
Total Bilirubin: 1.1 mg/dL (ref 0.0–1.2)
Total Protein: 7.5 g/dL (ref 6.5–8.1)

## 2023-11-22 LAB — CBC WITH DIFFERENTIAL/PLATELET
Abs Immature Granulocytes: 0.01 10*3/uL (ref 0.00–0.07)
Basophils Absolute: 0 10*3/uL (ref 0.0–0.1)
Basophils Relative: 1 %
Eosinophils Absolute: 0.2 10*3/uL (ref 0.0–0.5)
Eosinophils Relative: 5 %
HCT: 34.4 % — ABNORMAL LOW (ref 39.0–52.0)
Hemoglobin: 11.9 g/dL — ABNORMAL LOW (ref 13.0–17.0)
Immature Granulocytes: 0 %
Lymphocytes Relative: 48 %
Lymphs Abs: 1.4 10*3/uL (ref 0.7–4.0)
MCH: 29.8 pg (ref 26.0–34.0)
MCHC: 34.6 g/dL (ref 30.0–36.0)
MCV: 86 fL (ref 80.0–100.0)
Monocytes Absolute: 0.4 10*3/uL (ref 0.1–1.0)
Monocytes Relative: 13 %
Neutro Abs: 1 10*3/uL — ABNORMAL LOW (ref 1.7–7.7)
Neutrophils Relative %: 33 %
Platelets: 207 10*3/uL (ref 150–400)
RBC: 4 MIL/uL — ABNORMAL LOW (ref 4.22–5.81)
RDW: 23.6 % — ABNORMAL HIGH (ref 11.5–15.5)
WBC: 3 10*3/uL — ABNORMAL LOW (ref 4.0–10.5)
nRBC: 0 % (ref 0.0–0.2)

## 2023-11-22 LAB — MAGNESIUM: Magnesium: 1.9 mg/dL (ref 1.7–2.4)

## 2023-11-22 MED ORDER — HEPARIN SOD (PORK) LOCK FLUSH 100 UNIT/ML IV SOLN
500.0000 [IU] | Freq: Once | INTRAVENOUS | Status: AC
  Administered 2023-11-22: 500 [IU] via INTRAVENOUS

## 2023-11-22 MED ORDER — FILGRASTIM-SNDZ 480 MCG/0.8ML IJ SOSY
480.0000 ug | PREFILLED_SYRINGE | Freq: Once | INTRAMUSCULAR | Status: AC
  Administered 2023-11-22: 480 ug via SUBCUTANEOUS
  Filled 2023-11-22: qty 0.8

## 2023-11-22 MED ORDER — SODIUM CHLORIDE 0.9% FLUSH
10.0000 mL | INTRAVENOUS | Status: DC | PRN
  Administered 2023-11-22: 10 mL via INTRAVENOUS

## 2023-11-22 NOTE — Progress Notes (Signed)
 [facility] [address] [place] [address]   Clinic Day:  11/22/2023  Referring physician: Corrington, Kip A, MD  Patient Care Team: Bobbette Coye LABOR, MD as PCP - General (Family Medicine) Shaaron Lamar HERO, MD as Consulting Physician (Gastroenterology) Ladora Ross Lacy Phebe, MD as Referring Physician (Optometry)   ASSESSMENT & PLAN:   Assessment:  1.  Stage III (T3 N1) recto-sigmoid colon adenocarcinoma: - Presentation: Rectal bleeding, seen by GI Dr. Rosalynn Pottier - Colonoscopy (07/19/2023): Mild pancolonic diverticulosis.  A single sessile 1 cm nonbleeding polyp of benign appearance found in the transverse colon.  An ulcerated bleeding 8 cm mass of malignant appearance was found in the sigmoid colon.  Mass caused partial obstruction. - CEA (07/20/2023): 39.4, (09/05/2023): 10.6 - CT CAP (07/25/2023): Mass within the sigmoid colon.  Few mildly enlarged lymph nodes adjacent to the sigmoid colon.  Tiny left upper lobe pulmonary nodule, indeterminate. - S/p robotic LAR on 08/23/2023 by Dr. Con [facility]) - Pathology: Invasive colonic adenocarcinoma, moderately differentiated arising in a tubulovillous adenoma, tumor size 6.8 x 5.6 cm.  Tumor location: Distal sigmoid/rectosigmoid colon.  1/31 lymph nodes positive.  All margins negative.  LVI-not identified.  PNI-present.  pT3 pN1a.  IHC for DNA mismatch repair proteins shows retained nuclear expression of all 4 mismatch repair proteins. - Cancer next expanded +RNA insight panel [name]): Negative - I have called and talked to the pathologist.  Per the OR report and surgical pathology, tumor is at distal sigmoid/rectosigmoid area. - I have recommended 4 months of chemotherapy with CapeOx followed by chemoradiation with Xeloda . - Cycle 1 of CapeOx on 09/20/2023 - Dr. Con called me during the week of 10/01/2023.  He told me that the tumor location was mostly in the distal sigmoid region.  He was very confident that he had  all the lymph nodes out and recommended that we may be able to avoid radiation.  I have recommended that we will plan for total of 6 months of chemotherapy if we are avoiding radiation.  2.  Social/family history: - Lives at home with his wife Allyson.  He works Paramedic job.  Non-smoker. - Maternal grandfather had brain tumor.  Paternal aunt had pancreatic cancer.   Plan:  1.  Stage III (T3 N1) recto-sigmoid colon adenocarcinoma, MMR preserved: - He received cycle 3 of CapeOx on 11/01/2023. - Diarrhea was better controlled with Imodium daily for 1-2 times per day. - He had intermittent nausea.  No vomiting reported. - Labs today: Normal LFTs and creatinine.  CBC shows white count is low at 3.0 with ANC of 990.  Platelet count is normal. - Will hold his treatment today.  He will receive G-CSF for 80 mcg.  He will come back tomorrow for his cycle 4.  He will start Xeloda  4 tablets twice daily tomorrow.  RTC 3 weeks for follow-up.  2.  Peripheral neuropathy: - Toe numbness present prior to chemotherapy stable.  He has cold sensitivity lasting about 5 days.  No numbness in the fingertips reported.  3.  Diabetes: - Ozempic  on hold until he finishes chemotherapy.  Blood sugar today is 169.   Orders Placed This Encounter  Procedures   Signatera    Select as applicable. If patient is on or planning to receive immunotherapy, select drug: Not on Immunotherapy If Other or Multiple, Write down drug name:  Do not Delete Below This Line   ==========Department Information========== ID: [id number] Department:[facility]  [facility] - A DEPT OF [facility] [address] [place] [address] Dept: [phone] Dept Fax: [phone]    Standing Status:   Future    Number of Occurrences:   1    Expected Date:   11/22/2023    Expiration Date:   11/21/2024    Jennell to follow up with patient for sample collection (mobile phleb, lab, or  saliva)::   No    Surveillance Program draw frequency::   Every 3 Months    Surveillance Program draw count::   4    Cancer type::   Rectal    Stage of diagnosis::   III    History of recurrence?:   No    Current disease status::   Active disease    Is the patient receiving or planning to receive immunotherapy?:   No    Patient status::   Outpatient    By placing this electronic order I confirm the testing ordered herein is medically necessary and this patient has been informed of the details of the genetic test(s) ordered, including the risks, benefits, and alternatives, and has consented to testing.:   Yes    What type of billing?:   [employer] R Teague,acting as a Neurosurgeon for [employer], MD.,have documented all relevant documentation on the behalf of Alean Stands, MD,as directed by  Alean Stands, MD while in the presence of Alean Stands, MD.  I, Alean Stands MD, have reviewed the above documentation for accuracy and completeness, and I agree with the above.     Alean Stands, MD   6/26/20254:21 PM  CHIEF COMPLAINT/PURPOSE OF CONSULT:   Diagnosis: sigmoid colon cancer    Cancer Staging  Rectal adenocarcinoma [facility]) Staging form: Colon and Rectum, AJCC 8th Edition - Clinical stage from 09/10/2023: Stage IIIB (cT3, cN1a, cM0) - Signed by Stands Alean, MD on 09/10/2023    Prior Therapy: Resection on 08/23/23  Current Therapy: CapeOx   HISTORY OF PRESENT ILLNESS:   Oncology History  Rectal adenocarcinoma (HCC)  09/10/2023 Initial Diagnosis   Rectal adenocarcinoma (HCC)   09/10/2023 Cancer Staging   Staging form: Colon and Rectum, AJCC 8th Edition - Clinical stage from 09/10/2023: Stage IIIB (cT3, cN1a, cM0) - Signed by Stands Alean, MD on 09/10/2023 Histopathologic type: Adenocarcinoma, NOS Stage prefix: Initial diagnosis Total positive nodes: 1 Total nodes examined: 31 Histologic grade (G):  G2 Histologic grading system: 4 grade system   09/20/2023 -  Chemotherapy   Patient is on Treatment Plan : RECTAL Xelox (Capeox) (130/850) q21d x 6 cycles         Temple is a 43 y.o. male presenting to clinic today for evaluation of sigmoid colon cancer at the request of Dr. Con.  He initially presented to his PCP on 06/19/23 with rectal bleeding. He was referred to Dr. Frutoso in GI at [facility] and underwent colonoscopy on 07/19/23. He was found to have a malignant-appearing partially-obstructing mass in sigmoid colon. He underwent staging CT C/A/P on 07/25/23 showing: 3 cm mass within sigmoid colon; few mildly enlarged lymph nodes adjacent to sigmoid colon, with index partially necrotic lymph node measuring 1.7 cm; tiny indeterminate LUL pulmonary nodule.  He was referred to colorectal surgery at [facility] and was taken for resection on 08/23/23 under Dr. Con. Pathology confirmed 6.8 cm invasive colonic adenocarcinoma, moderately differentiated, arising in a tubulovillous adenoma. Out of 31 biopsied lymph nodes, one was positive for metastatic  disease.  Today, he states that he is doing well overall. His appetite level is at 100%. His energy level is at 50%.  INTERVAL HISTORY:   Randall Murphy is a 43 y.o. male presenting to the clinic today for follow-up of sigmoid colon cancer. He was last seen by me on 11/01/23.  Today, he states that he is doing well overall. His appetite level is at 100%. His energy level is at 100%. Randall Murphy is accompanied by his wife.   He tolerated his last treatment well and notes improved energy levels after treatment. He reports no worsening fatigue or diarrhea after his last treatment. He has used 1-2 pills of Imodium daily that did not cause constipation. Cold sensitivities lasted 5 days after treatment and symptoms have improved with EMLA  cream. His peripheral neuropathy is stable. He denies any mouth sores. Nausea is well controlled with compazine , which he believes is  due to Xeloda .   His wife notes immediately after his last treatment he had flushing and nausea that lasted for around 10 minutes before resolving. Nausea immediately after treatment also occurred after the second to last treatment. He denies any side effects from antihistamines other than drowsiness. Randall Murphy took 1 pill of compazine  this morning to hopefully prevent nausea.   PAST MEDICAL HISTORY:   Past Medical History: Past Medical History:  Diagnosis Date   ADD (attention deficit disorder) 02/22/2015   Anxiety, generalized 02/15/2016   Diabetes mellitus without complication (HCC)    Dyslipidemia 02/22/2015   Elevated liver enzymes    Fatty liver 03/23/2015   Hypertension     Surgical History: Past Surgical History:  Procedure Laterality Date   IR IMAGING GUIDED PORT INSERTION  09/07/2023    Social History: Social History   Socioeconomic History   Marital status: Married    Spouse name: Not on file   Number of children: 2   Years of education: Not on file   Highest education level: Not on file  Occupational History   Occupation: IT  Tobacco Use   Smoking status: Never   Smokeless tobacco: Never  Vaping Use   Vaping status: Never Used  Substance and Sexual Activity   Alcohol use: Yes    Alcohol/week: 4.0 standard drinks of alcohol    Types: 4 Cans of beer per week    Comment: daily   Drug use: Never   Sexual activity: Yes    Birth control/protection: Condom  Other Topics Concern   Not on file  Social History Narrative   Not on file   Social Drivers of Health   Financial Resource Strain: Low Risk  (07/20/2023)   Received from [facility]   Overall Financial Resource Strain (CARDIA)    Difficulty of Paying Living Expenses: Not very hard  Food Insecurity: No Food Insecurity (08/23/2023)   Received from [facility]   Hunger Vital Sign    Within the past 12 months, you worried that your food would run out before you got the money to buy more.: Never true     Within the past 12 months, the food you bought just didn't last and you didn't have money to get more.: Never true  Transportation Needs: No Transportation Needs (08/23/2023)   Received from [facility] - Transportation    Lack of Transportation (Medical): No    Lack of Transportation (Non-Medical): No  Physical Activity: Insufficiently Active (07/20/2023)   Received from [facility]   Exercise Vital Sign    On  average, how many days per week do you engage in moderate to strenuous exercise (like a brisk walk)?: 2 days    On average, how many minutes do you engage in exercise at this level?: 20 min  Stress: No Stress Concern Present (08/23/2023)   Received from [facility] - Occupational Stress Questionnaire    Feeling of Stress : Not at all  Social Connections: Moderately Integrated (07/20/2023)   Received from [facility]   Social Network    How would you rate your social network (family, work, friends)?: Adequate participation with social networks  Intimate Partner Violence: Not At Risk (08/23/2023)   Received from [facility]   HITS    Over the last 12 months how often did your partner physically hurt you?: Never    Over the last 12 months how often did your partner insult you or talk down to you?: Never    Over the last 12 months how often did your partner threaten you with physical harm?: Never    Over the last 12 months how often did your partner scream or curse at you?: Never    Family History: Family History  Problem Relation Age of Onset   Arthritis Mother    Diabetes Father    Hypertension Father    Stroke Father    Kidney disease Father        ESRD on dialysis   Diabetes Sister    Hypertension Brother    Diabetes Maternal Grandmother    Brain cancer Maternal Grandfather    Heart attack Paternal Grandfather     Current Medications:  Current Outpatient Medications:    buPROPion (WELLBUTRIN XL) 150 MG 24  hr tablet, Take 150 mg by mouth., Disp: , Rfl:    capecitabine  (XELODA ) 500 MG tablet, Take 4 tablets (2,000 mg total) by mouth 2 (two) times daily after a meal. Take 2 weeks on/1 week off every 3 weeks x 6, Disp: 112 tablet, Rfl: 5   Continuous Blood Gluc Sensor (FREESTYLE LIBRE 3 SENSOR) MISC, 1 Device by Does not apply route every 14 (fourteen) days. Place 1 sensor on the skin every 14 days. Use to check glucose continuously, Disp: 2 each, Rfl: 2   escitalopram  (LEXAPRO ) 10 MG tablet, Take 1 tablet (10 mg total) by mouth daily. (NEEDS TO BE SEEN BEFORE NEXT REFILL), Disp: 30 tablet, Rfl: 0   lidocaine -prilocaine  (EMLA ) cream, Apply to affected area once, Disp: 30 g, Rfl: 3   lisinopril  (ZESTRIL ) 10 MG tablet, Take 1 tablet (10 mg total) by mouth daily., Disp: 90 tablet, Rfl: 3   oxyCODONE (OXY IR/ROXICODONE) 5 MG immediate release tablet, Take 5 mg by mouth every 4 (four) hours as needed., Disp: , Rfl:    pravastatin  (PRAVACHOL ) 40 MG tablet, Take 1 tablet (40 mg total) by mouth daily. (NEEDS TO BE SEEN BEFORE NEXT REFILL), Disp: 30 tablet, Rfl: 0   prochlorperazine  (COMPAZINE ) 10 MG tablet, Take 1 tablet (10 mg total) by mouth every 6 (six) hours as needed for nausea or vomiting., Disp: 60 tablet, Rfl: 3   Semaglutide , 2 MG/DOSE, (OZEMPIC , 2 MG/DOSE,) 8 MG/3ML SOPN, Inject 2 mg into the skin once a week. (NEEDS TO BE SEEN BEFORE NEXT REFILL), Disp: 3 mL, Rfl: 0   VYVANSE  40 MG capsule, Take by mouth., Disp: , Rfl:    Allergies: No Known Allergies  REVIEW OF SYSTEMS:   Review of Systems  Constitutional:  Negative for chills, fatigue  and fever.  HENT:   Negative for lump/mass, mouth sores, nosebleeds, sore throat and trouble swallowing.   Eyes:  Negative for eye problems.  Respiratory:  Negative for cough and shortness of breath.   Cardiovascular:  Negative for chest pain, leg swelling and palpitations.  Gastrointestinal:  Negative for abdominal pain, constipation, diarrhea, nausea and  vomiting.  Genitourinary:  Negative for bladder incontinence, difficulty urinating, dysuria, frequency, hematuria and nocturia.   Musculoskeletal:  Negative for arthralgias, back pain, flank pain, myalgias and neck pain.  Skin:  Negative for itching and rash.  Neurological:  Negative for dizziness, headaches and numbness.  Hematological:  Does not bruise/bleed easily.  Psychiatric/Behavioral:  Negative for depression, sleep disturbance and suicidal ideas. The patient is not nervous/anxious.   All other systems reviewed and are negative.    VITALS:   There were no vitals taken for this visit.  Wt Readings from Last 3 Encounters:  11/22/23 187 lb (84.8 kg)  11/01/23 189 lb (85.7 kg)  10/11/23 191 lb 12.8 oz (87 kg)    There is no height or weight on file to calculate BMI.  Performance status (ECOG): 0 - Asymptomatic  PHYSICAL EXAM:   Physical Exam Vitals and nursing note reviewed. Exam conducted with a chaperone present.  Constitutional:      Appearance: Normal appearance.   Cardiovascular:     Rate and Rhythm: Normal rate and regular rhythm.     Pulses: Normal pulses.     Heart sounds: Normal heart sounds.  Pulmonary:     Effort: Pulmonary effort is normal.     Breath sounds: Normal breath sounds.  Abdominal:     Palpations: Abdomen is soft. There is no hepatomegaly, splenomegaly or mass.     Tenderness: There is no abdominal tenderness.   Musculoskeletal:     Right lower leg: No edema.     Left lower leg: No edema.  Lymphadenopathy:     Cervical: No cervical adenopathy.     Right cervical: No superficial, deep or posterior cervical adenopathy.    Left cervical: No superficial, deep or posterior cervical adenopathy.     Upper Body:     Right upper body: No supraclavicular or axillary adenopathy.     Left upper body: No supraclavicular or axillary adenopathy.   Neurological:     General: No focal deficit present.     Mental Status: He is alert and oriented to  person, place, and time.   Psychiatric:        Mood and Affect: Mood normal.        Behavior: Behavior normal.   Laparoscopic surgical sites are healing well.  LABS:   CBC    Component Value Date/Time   WBC 3.0 (L) 11/22/2023 0856   RBC 4.00 (L) 11/22/2023 0856   HGB 11.9 (L) 11/22/2023 0856   HGB 14.2 05/18/2022 0938   HCT 34.4 (L) 11/22/2023 0856   HCT 42.4 05/18/2022 0938   PLT 207 11/22/2023 0856   PLT 298 05/18/2022 0938   MCV 86.0 11/22/2023 0856   MCV 81 05/18/2022 0938   MCH 29.8 11/22/2023 0856   MCHC 34.6 11/22/2023 0856   RDW 23.6 (H) 11/22/2023 0856   RDW 12.3 05/18/2022 0938   LYMPHSABS 1.4 11/22/2023 0856   LYMPHSABS 1.8 05/18/2022 0938   MONOABS 0.4 11/22/2023 0856   EOSABS 0.2 11/22/2023 0856   EOSABS 0.3 05/18/2022 0938   BASOSABS 0.0 11/22/2023 0856   BASOSABS 0.1 05/18/2022 0938    CMP  Component Value Date/Time   NA 137 11/22/2023 0856   NA 137 05/18/2022 0848   K 4.1 11/22/2023 0856   CL 103 11/22/2023 0856   CO2 21 (L) 11/22/2023 0856   GLUCOSE 169 (H) 11/22/2023 0856   BUN 12 11/22/2023 0856   BUN 6 05/18/2022 0848   CREATININE 0.83 11/22/2023 0856   CALCIUM  9.3 11/22/2023 0856   PROT 7.5 11/22/2023 0856   PROT 7.6 05/18/2022 0848   ALBUMIN 4.0 11/22/2023 0856   ALBUMIN 4.7 05/18/2022 0848   AST 34 11/22/2023 0856   ALT 27 11/22/2023 0856   ALKPHOS 62 11/22/2023 0856   BILITOT 1.1 11/22/2023 0856   BILITOT 0.8 05/18/2022 0848   GFRNONAA >60 11/22/2023 0856   GFRAA 102 01/22/2020 0924     Lab Results  Component Value Date   CEA1 4.5 10/10/2023   /  CEA  Date Value Ref Range Status  10/10/2023 4.5 0.0 - 4.7 ng/mL Final    Comment:    (NOTE)                             Nonsmokers          <3.9                             Smokers             <5.6 [employer] Electrochemiluminescence Immunoassay (ECLIA) Values obtained with different assay methods or kits cannot be used interchangeably.  Results cannot  be interpreted as absolute evidence of the presence or absence of malignant disease. Performed At: [facility] [address] [place], [place] [id number] Jennette Shorter MD Ey:[phone]    No results found for: PSA1 No results found for: CAN199 No results found for: CAN125  No results found for: TOTALPROTELP, ALBUMINELP, A1GS, A2GS, BETS, BETA2SER, GAMS, MSPIKE, SPEI Lab Results  Component Value Date   TIBC 463 (H) 09/05/2023   FERRITIN 299 09/05/2023   IRONPCTSAT 29 09/05/2023   No results found for: LDH   STUDIES:   No results found.

## 2023-11-22 NOTE — Patient Instructions (Signed)

## 2023-11-22 NOTE — Progress Notes (Signed)
 Patients port flushed without difficulty.  Good blood return noted with no bruising or swelling noted at site.  Patient remains accessed for treatment.

## 2023-11-22 NOTE — Progress Notes (Signed)
 Patient presents today for chemotherapy infusion of Oxaliplatin .  Vital signs are stable.  Labs reviewed by Dr. Rogers during the office visit ANC 1.0. Patient's treatment to be held today and Filgrastim injection to be given and Signetera blood work to be drawn prior to port de-access. Patient to return tomorrow for labs and possible treatment.   Patient tolerated injection in right arm with no complaints voiced.  Site clean and dry with no bruising or swelling noted.  No complaints of pain. Patients port flushed without difficulty.  Good blood return noted with no bruising or swelling noted at site.  Band aid applied. Discharged with vital signs stable and no signs or symptoms of distress noted.

## 2023-11-22 NOTE — Patient Instructions (Signed)
 [facility] [place] - A DEPT OF [facility]  Discharge Instructions: Thank you for choosing [facility] to provide your oncology and hematology care.  If you have a lab appointment with the Cancer Center - please note that after April 8th, 2024, all labs will be drawn in the cancer center.  You do not have to check in or register with the main entrance as you have in the past but will complete your check-in in the cancer center.  Wear comfortable clothing and clothing appropriate for easy access to any Portacath or PICC line.   We strive to give you quality time with your provider. You may need to reschedule your appointment if you arrive late (15 or more minutes).  Arriving late affects you and other patients whose appointments are after yours.  Also, if you miss three or more appointments without notifying the office, you may be dismissed from the clinic at the provider's discretion.      For prescription refill requests, have your pharmacy contact our office and allow 72 hours for refills to be completed.    Today you received the following chemotherapy and/or immunotherapy agents Filgrastim.  Filgrastim Injection What is this medication? FILGRASTIM (fil GRA stim) lowers the risk of infection in people who are receiving chemotherapy. It works by Systems analyst make more white blood cells, which protects your body from infection. It may also be used to help people who have been exposed to high doses of radiation. It can be used to help prepare your body before a stem cell transplant. It works by helping your bone marrow make and release stem cells into the blood. This medicine may be used for other purposes; ask your health care provider or pharmacist if you have questions. COMMON BRAND NAME(S): Neupogen, Nivestym, Nypozi, Releuko, Zarxio What should I tell my care team before I take this medication? They need to know if you have any of these  conditions: History of blood diseases, such as sickle cell anemia Kidney disease Recent or ongoing radiation An unusual or allergic reaction to filgrastim, pegfilgrastim, latex, rubber, other medications, foods, dyes, or preservatives Pregnant or trying to get pregnant Breast-feeding How should I use this medication? This medication is injected under the skin or into a vein. It is usually given by your care team in a hospital or clinic setting. It may be given at home. If you get this medication at home, you will be taught how to prepare and give it. Use exactly as directed. Take it as directed on the prescription label at the same time every day. Keep taking it unless your care team tells you to stop. It is important that you put your used needles and syringes in a special sharps container. Do not put them in a trash can. If you do not have a sharps container, call your pharmacist or care team to get one. This medication comes with INSTRUCTIONS FOR USE. Ask your pharmacist for directions on how to use this medication. Read the information carefully. Talk to your pharmacist or care team if you have questions. Talk to your care team about the use of this medication in children. While it may be prescribed for children for selected conditions, precautions do apply. Overdosage: If you think you have taken too much of this medicine contact a poison control center or emergency room at once. NOTE: This medicine is only for you. Do not share this medicine with others. What  if I miss a dose? It is important not to miss any doses. Talk to your care team about what to do if you miss a dose. What may interact with this medication? Medications that may cause a release of neutrophils, such as lithium This list may not describe all possible interactions. Give your health care provider a list of all the medicines, herbs, non-prescription drugs, or dietary supplements you use. Also tell them if you smoke, drink  alcohol, or use illegal drugs. Some items may interact with your medicine. What should I watch for while using this medication? Your condition will be monitored carefully while you are receiving this medication. You may need bloodwork while taking this medication. Talk to your care team about your risk of cancer. You may be more at risk for certain types of cancer if you take this medication. What side effects may I notice from receiving this medication? Side effects that you should report to your care team as soon as possible: Allergic reactions--skin rash, itching, hives, swelling of the face, lips, tongue, or throat Capillary leak syndrome--stomach or muscle pain, unusual weakness or fatigue, feeling faint or lightheaded, decrease in the amount of urine, swelling of the ankles, hands, or feet, trouble breathing High white blood cell level--fever, fatigue, trouble breathing, night sweats, change in vision, weight loss Inflammation of the aorta--fever, fatigue, back, chest, or stomach pain, severe headache Kidney injury (glomerulonephritis)--decrease in the amount of urine, red or dark brown urine, foamy or bubbly urine, swelling of the ankles, hands, or feet Shortness of breath or trouble breathing Spleen injury--pain in upper left stomach or shoulder Unusual bruising or bleeding Side effects that usually do not require medical attention (report to your care team if they continue or are bothersome): Back pain Bone pain Fatigue Fever Headache Nausea This list may not describe all possible side effects. Call your doctor for medical advice about side effects. You may report side effects to FDA at 1-800-FDA-1088. Where should I keep my medication? Keep out of the reach of children and pets. Keep this medication in the original packaging until you are ready to take it. Protect from light. See product for storage information. Each product may have different instructions. Get rid of any unused  medication after the expiration date. To get rid of medications that are no longer needed or have expired: Take the medication to a medications take-back program. Check with your pharmacy or law enforcement to find a location. If you cannot return the medication, ask your pharmacist or care team how to get rid of this medication safely. NOTE: This sheet is a summary. It may not cover all possible information. If you have questions about this medicine, talk to your doctor, pharmacist, or health care provider.  2024 Elsevier/Gold Standard (2021-10-06 00:00:00)      To help prevent nausea and vomiting after your treatment, we encourage you to take your nausea medication as directed.  BELOW ARE SYMPTOMS THAT SHOULD BE REPORTED IMMEDIATELY: *FEVER GREATER THAN 100.4 F (38 C) OR HIGHER *CHILLS OR SWEATING *NAUSEA AND VOMITING THAT IS NOT CONTROLLED WITH YOUR NAUSEA MEDICATION *UNUSUAL SHORTNESS OF BREATH *UNUSUAL BRUISING OR BLEEDING *URINARY PROBLEMS (pain or burning when urinating, or frequent urination) *BOWEL PROBLEMS (unusual diarrhea, constipation, pain near the anus) TENDERNESS IN MOUTH AND THROAT WITH OR WITHOUT PRESENCE OF ULCERS (sore throat, sores in mouth, or a toothache) UNUSUAL RASH, SWELLING OR PAIN  UNUSUAL VAGINAL DISCHARGE OR ITCHING   Items with * indicate a potential emergency and  should be followed up as soon as possible or go to the Emergency Department if any problems should occur.  Please show the CHEMOTHERAPY ALERT CARD or IMMUNOTHERAPY ALERT CARD at check-in to the Emergency Department and triage nurse.  Should you have questions after your visit or need to cancel or reschedule your appointment, please contact [facility] [place] - A DEPT OF [facility] [phone]  and follow the prompts.  Office hours are 8:00 a.m. to 4:30 p.m. Monday - Friday. Please note that voicemails left after 4:00 p.m. may not be returned until the following business  day.  We are closed weekends and major holidays. You have access to a nurse at all times for urgent questions. Please call the main number to the clinic [phone] and follow the prompts.  For any non-urgent questions, you may also contact your provider using MyChart. We now offer e-Visits for anyone 40 and older to request care online for non-urgent symptoms. For details visit [email or web address].   Also download the MyChart app! Go to the app store, search MyChart, open the app, select [place], and log in with your MyChart username and password.

## 2023-11-22 NOTE — Progress Notes (Signed)
Patient is taking Xeloda as prescribed.  He has not missed any doses and reports no side effects at this time.   

## 2023-11-23 ENCOUNTER — Inpatient Hospital Stay

## 2023-11-23 VITALS — BP 113/77 | HR 99 | Temp 98.7°F | Resp 18

## 2023-11-23 DIAGNOSIS — C20 Malignant neoplasm of rectum: Secondary | ICD-10-CM

## 2023-11-23 DIAGNOSIS — Z5111 Encounter for antineoplastic chemotherapy: Secondary | ICD-10-CM | POA: Diagnosis not present

## 2023-11-23 LAB — CBC WITH DIFFERENTIAL/PLATELET
Abs Immature Granulocytes: 0.22 10*3/uL — ABNORMAL HIGH (ref 0.00–0.07)
Basophils Absolute: 0.1 10*3/uL (ref 0.0–0.1)
Basophils Relative: 0 %
Eosinophils Absolute: 0.2 10*3/uL (ref 0.0–0.5)
Eosinophils Relative: 1 %
HCT: 35.2 % — ABNORMAL LOW (ref 39.0–52.0)
Hemoglobin: 12.3 g/dL — ABNORMAL LOW (ref 13.0–17.0)
Immature Granulocytes: 2 %
Lymphocytes Relative: 10 %
Lymphs Abs: 1.5 10*3/uL (ref 0.7–4.0)
MCH: 30.7 pg (ref 26.0–34.0)
MCHC: 34.9 g/dL (ref 30.0–36.0)
MCV: 87.8 fL (ref 80.0–100.0)
Monocytes Absolute: 0.8 10*3/uL (ref 0.1–1.0)
Monocytes Relative: 6 %
Neutro Abs: 11.3 10*3/uL — ABNORMAL HIGH (ref 1.7–7.7)
Neutrophils Relative %: 81 %
Platelets: 193 10*3/uL (ref 150–400)
RBC: 4.01 MIL/uL — ABNORMAL LOW (ref 4.22–5.81)
RDW: 23.9 % — ABNORMAL HIGH (ref 11.5–15.5)
WBC: 13.9 10*3/uL — ABNORMAL HIGH (ref 4.0–10.5)
nRBC: 0 % (ref 0.0–0.2)

## 2023-11-23 MED ORDER — SODIUM CHLORIDE 0.9 % IV SOLN
150.0000 mg | Freq: Once | INTRAVENOUS | Status: AC
  Administered 2023-11-23: 150 mg via INTRAVENOUS
  Filled 2023-11-23: qty 150

## 2023-11-23 MED ORDER — PALONOSETRON HCL INJECTION 0.25 MG/5ML
0.2500 mg | Freq: Once | INTRAVENOUS | Status: AC
  Administered 2023-11-23: 0.25 mg via INTRAVENOUS
  Filled 2023-11-23: qty 5

## 2023-11-23 MED ORDER — HEPARIN SOD (PORK) LOCK FLUSH 100 UNIT/ML IV SOLN
500.0000 [IU] | Freq: Once | INTRAVENOUS | Status: AC | PRN
  Administered 2023-11-23: 500 [IU]

## 2023-11-23 MED ORDER — DEXAMETHASONE SODIUM PHOSPHATE 10 MG/ML IJ SOLN
10.0000 mg | Freq: Once | INTRAMUSCULAR | Status: AC
  Administered 2023-11-23: 10 mg via INTRAVENOUS
  Filled 2023-11-23: qty 1

## 2023-11-23 MED ORDER — DEXTROSE 5 % IV SOLN
INTRAVENOUS | Status: DC
Start: 2023-11-23 — End: 2023-11-23

## 2023-11-23 MED ORDER — CETIRIZINE HCL 10 MG/ML IV SOLN
10.0000 mg | Freq: Once | INTRAVENOUS | Status: AC
  Administered 2023-11-23: 10 mg via INTRAVENOUS
  Filled 2023-11-23: qty 1

## 2023-11-23 MED ORDER — SODIUM CHLORIDE 0.9% FLUSH
10.0000 mL | Freq: Once | INTRAVENOUS | Status: AC
  Administered 2023-11-23: 10 mL via INTRAVENOUS

## 2023-11-23 MED ORDER — SODIUM CHLORIDE 0.9% FLUSH
10.0000 mL | INTRAVENOUS | Status: DC | PRN
Start: 2023-11-23 — End: 2023-11-23
  Administered 2023-11-23: 10 mL

## 2023-11-23 MED ORDER — OXALIPLATIN CHEMO INJECTION 100 MG/20ML
130.0000 mg/m2 | Freq: Once | INTRAVENOUS | Status: AC
  Administered 2023-11-23: 250 mg via INTRAVENOUS
  Filled 2023-11-23: qty 40

## 2023-11-23 NOTE — Progress Notes (Signed)
 Patient presents today for chemotherapy infusion.  Patient is in satisfactory condition with no new complaints voiced.  Vital signs are stable.  Labs reviewed.   ANC today is 11.3.  We will proceed with treatment per MD orders.   Patient tolerated treatment well with no complaints voiced.  Patient left ambulatory with wife in stable condition.  Vital signs stable at discharge.  Follow up as scheduled.

## 2023-11-23 NOTE — Patient Instructions (Signed)
 [facility] [place] - A DEPT OF [place]  Discharge Instructions: Thank you for choosing [facility] to provide your oncology and hematology care.  If you have a lab appointment with the Cancer Center - please note that after April 8th, 2024, all labs will be drawn in the cancer center.  You do not have to check in or register with the main entrance as you have in the past but will complete your check-in in the cancer center.  Wear comfortable clothing and clothing appropriate for easy access to any Portacath or PICC line.   We strive to give you quality time with your provider. You may need to reschedule your appointment if you arrive late (15 or more minutes).  Arriving late affects you and other patients whose appointments are after yours.  Also, if you miss three or more appointments without notifying the office, you may be dismissed from the clinic at the provider's discretion.      For prescription refill requests, have your pharmacy contact our office and allow 72 hours for refills to be completed.    Today you received the following chemotherapy and/or immunotherapy agents Oxaliplatin .  Oxaliplatin  Injection What is this medication? OXALIPLATIN  (ox AL i PLA tin) treats colorectal cancer. It works by slowing down the growth of cancer cells. This medicine may be used for other purposes; ask your health care provider or pharmacist if you have questions. COMMON BRAND NAME(S): Eloxatin  What should I tell my care team before I take this medication? They need to know if you have any of these conditions: Heart disease History of irregular heartbeat or rhythm Liver disease Low blood cell levels (white cells, red cells, and platelets) Lung or breathing disease, such as asthma Take medications that treat or prevent blood clots Tingling of the fingers, toes, or other nerve disorder An unusual or allergic reaction to oxaliplatin , other medications,  foods, dyes, or preservatives If you or your partner are pregnant or trying to get pregnant Breast-feeding How should I use this medication? This medication is injected into a vein. It is given by your care team in a hospital or clinic setting. Talk to your care team about the use of this medication in children. Special care may be needed. Overdosage: If you think you have taken too much of this medicine contact a poison control center or emergency room at once. NOTE: This medicine is only for you. Do not share this medicine with others. What if I miss a dose? Keep appointments for follow-up doses. It is important not to miss a dose. Call your care team if you are unable to keep an appointment. What may interact with this medication? Do not take this medication with any of the following: Cisapride Dronedarone Pimozide Thioridazine This medication may also interact with the following: Aspirin and aspirin-like medications Certain medications that treat or prevent blood clots, such as warfarin, apixaban, dabigatran, and rivaroxaban Cisplatin Cyclosporine Diuretics Medications for infection, such as acyclovir, adefovir, amphotericin B, bacitracin, cidofovir, foscarnet, ganciclovir, gentamicin, pentamidine, vancomycin NSAIDs, medications for pain and inflammation, such as ibuprofen or naproxen Other medications that cause heart rhythm changes Pamidronate Zoledronic acid This list may not describe all possible interactions. Give your health care provider a list of all the medicines, herbs, non-prescription drugs, or dietary supplements you use. Also tell them if you smoke, drink alcohol, or use illegal drugs. Some items may interact with your medicine. What should I watch for while using  this medication? Your condition will be monitored carefully while you are receiving this medication. You may need blood work while taking this medication. This medication may make you feel generally unwell.  This is not uncommon as chemotherapy can affect healthy cells as well as cancer cells. Report any side effects. Continue your course of treatment even though you feel ill unless your care team tells you to stop. This medication may increase your risk of getting an infection. Call your care team for advice if you get a fever, chills, sore throat, or other symptoms of a cold or flu. Do not treat yourself. Try to avoid being around people who are sick. Avoid taking medications that contain aspirin, acetaminophen , ibuprofen, naproxen, or ketoprofen unless instructed by your care team. These medications may hide a fever. Be careful brushing or flossing your teeth or using a toothpick because you may get an infection or bleed more easily. If you have any dental work done, tell your dentist you are receiving this medication. This medication can make you more sensitive to cold. Do not drink cold drinks or use ice. Cover exposed skin before coming in contact with cold temperatures or cold objects. When out in cold weather wear warm clothing and cover your mouth and nose to warm the air that goes into your lungs. Tell your care team if you get sensitive to the cold. Talk to your care team if you or your partner are pregnant or think either of you might be pregnant. This medication can cause serious birth defects if taken during pregnancy and for 9 months after the last dose. A negative pregnancy test is required before starting this medication. A reliable form of contraception is recommended while taking this medication and for 9 months after the last dose. Talk to your care team about effective forms of contraception. Do not father a child while taking this medication and for 6 months after the last dose. Use a condom while having sex during this time period. Do not breastfeed while taking this medication and for 3 months after the last dose. This medication may cause infertility. Talk to your care team if you are  concerned about your fertility. What side effects may I notice from receiving this medication? Side effects that you should report to your care team as soon as possible: Allergic reactions--skin rash, itching, hives, swelling of the face, lips, tongue, or throat Bleeding--bloody or black, tar-like stools, vomiting blood or Randall Murphy material that looks like coffee grounds, red or dark Randall Murphy urine, small red or purple spots on skin, unusual bruising or bleeding Dry cough, shortness of breath or trouble breathing Heart rhythm changes--fast or irregular heartbeat, dizziness, feeling faint or lightheaded, chest pain, trouble breathing Infection--fever, chills, cough, sore throat, wounds that don't heal, pain or trouble when passing urine, general feeling of discomfort or being unwell Liver injury--right upper belly pain, loss of appetite, nausea, light-colored stool, dark yellow or Randall Murphy urine, yellowing skin or eyes, unusual weakness or fatigue Low red blood cell level--unusual weakness or fatigue, dizziness, headache, trouble breathing Muscle injury--unusual weakness or fatigue, muscle pain, dark yellow or Randall Murphy urine, decrease in amount of urine Pain, tingling, or numbness in the hands or feet Sudden and severe headache, confusion, change in vision, seizures, which may be signs of posterior reversible encephalopathy syndrome (PRES) Unusual bruising or bleeding Side effects that usually do not require medical attention (report to your care team if they continue or are bothersome): Diarrhea Nausea Pain, redness, or swelling with  sores inside the mouth or throat Unusual weakness or fatigue Vomiting This list may not describe all possible side effects. Call your doctor for medical advice about side effects. You may report side effects to FDA at 1-800-FDA-1088. Where should I keep my medication? This medication is given in a hospital or clinic. It will not be stored at home. NOTE: This sheet is a  summary. It may not cover all possible information. If you have questions about this medicine, talk to your doctor, pharmacist, or health care provider.  2024 Elsevier/Gold Standard (2023-04-27 00:00:00)       To help prevent nausea and vomiting after your treatment, we encourage you to take your nausea medication as directed.  BELOW ARE SYMPTOMS THAT SHOULD BE REPORTED IMMEDIATELY: *FEVER GREATER THAN 100.4 F (38 C) OR HIGHER *CHILLS OR SWEATING *NAUSEA AND VOMITING THAT IS NOT CONTROLLED WITH YOUR NAUSEA MEDICATION *UNUSUAL SHORTNESS OF BREATH *UNUSUAL BRUISING OR BLEEDING *URINARY PROBLEMS (pain or burning when urinating, or frequent urination) *BOWEL PROBLEMS (unusual diarrhea, constipation, pain near the anus) TENDERNESS IN MOUTH AND THROAT WITH OR WITHOUT PRESENCE OF ULCERS (sore throat, sores in mouth, or a toothache) UNUSUAL RASH, SWELLING OR PAIN  UNUSUAL VAGINAL DISCHARGE OR ITCHING   Items with * indicate a potential emergency and should be followed up as soon as possible or go to the Emergency Department if any problems should occur.  Please show the CHEMOTHERAPY ALERT CARD or IMMUNOTHERAPY ALERT CARD at check-in to the Emergency Department and triage nurse.  Should you have questions after your visit or need to cancel or reschedule your appointment, please contact [facility] [place] - A DEPT OF [facility] [phone]  and follow the prompts.  Office hours are 8:00 a.m. to 4:30 p.m. Monday - Friday. Please note that voicemails left after 4:00 p.m. may not be returned until the following business day.  We are closed weekends and major holidays. You have access to a nurse at all times for urgent questions. Please call the main number to the clinic [phone] and follow the prompts.  For any non-urgent questions, you may also contact your provider using MyChart. We now offer e-Visits for anyone 72 and older to request care online for non-urgent  symptoms. For details visit [email or web address].   Also download the MyChart app! Go to the app store, search MyChart, open the app, select [place], and log in with your MyChart username and password.

## 2023-12-13 ENCOUNTER — Encounter: Payer: Self-pay | Admitting: *Deleted

## 2023-12-13 ENCOUNTER — Inpatient Hospital Stay

## 2023-12-13 ENCOUNTER — Inpatient Hospital Stay: Admitting: Hematology

## 2023-12-13 ENCOUNTER — Inpatient Hospital Stay: Attending: Hematology

## 2023-12-13 VITALS — BP 128/88 | HR 91 | Temp 97.9°F | Resp 18

## 2023-12-13 VITALS — BP 116/78 | HR 91 | Temp 96.9°F | Resp 18 | Wt 189.9 lb

## 2023-12-13 DIAGNOSIS — C20 Malignant neoplasm of rectum: Secondary | ICD-10-CM

## 2023-12-13 DIAGNOSIS — Z79630 Long term (current) use of alkylating agent: Secondary | ICD-10-CM | POA: Insufficient documentation

## 2023-12-13 DIAGNOSIS — Z8249 Family history of ischemic heart disease and other diseases of the circulatory system: Secondary | ICD-10-CM | POA: Diagnosis not present

## 2023-12-13 DIAGNOSIS — Z79899 Other long term (current) drug therapy: Secondary | ICD-10-CM | POA: Diagnosis not present

## 2023-12-13 DIAGNOSIS — Z833 Family history of diabetes mellitus: Secondary | ICD-10-CM | POA: Diagnosis not present

## 2023-12-13 DIAGNOSIS — I10 Essential (primary) hypertension: Secondary | ICD-10-CM | POA: Insufficient documentation

## 2023-12-13 DIAGNOSIS — Z808 Family history of malignant neoplasm of other organs or systems: Secondary | ICD-10-CM | POA: Insufficient documentation

## 2023-12-13 DIAGNOSIS — Z5111 Encounter for antineoplastic chemotherapy: Secondary | ICD-10-CM | POA: Insufficient documentation

## 2023-12-13 DIAGNOSIS — R066 Hiccough: Secondary | ICD-10-CM | POA: Insufficient documentation

## 2023-12-13 DIAGNOSIS — D72819 Decreased white blood cell count, unspecified: Secondary | ICD-10-CM | POA: Insufficient documentation

## 2023-12-13 DIAGNOSIS — Z8261 Family history of arthritis: Secondary | ICD-10-CM | POA: Insufficient documentation

## 2023-12-13 DIAGNOSIS — R197 Diarrhea, unspecified: Secondary | ICD-10-CM | POA: Diagnosis not present

## 2023-12-13 DIAGNOSIS — K625 Hemorrhage of anus and rectum: Secondary | ICD-10-CM | POA: Insufficient documentation

## 2023-12-13 DIAGNOSIS — Z823 Family history of stroke: Secondary | ICD-10-CM | POA: Diagnosis not present

## 2023-12-13 DIAGNOSIS — E119 Type 2 diabetes mellitus without complications: Secondary | ICD-10-CM | POA: Insufficient documentation

## 2023-12-13 DIAGNOSIS — Z800 Family history of malignant neoplasm of digestive organs: Secondary | ICD-10-CM | POA: Insufficient documentation

## 2023-12-13 DIAGNOSIS — R0602 Shortness of breath: Secondary | ICD-10-CM | POA: Diagnosis not present

## 2023-12-13 DIAGNOSIS — Z7985 Long-term (current) use of injectable non-insulin antidiabetic drugs: Secondary | ICD-10-CM | POA: Insufficient documentation

## 2023-12-13 DIAGNOSIS — Z841 Family history of disorders of kidney and ureter: Secondary | ICD-10-CM | POA: Diagnosis not present

## 2023-12-13 DIAGNOSIS — Z95828 Presence of other vascular implants and grafts: Secondary | ICD-10-CM

## 2023-12-13 DIAGNOSIS — R599 Enlarged lymph nodes, unspecified: Secondary | ICD-10-CM | POA: Diagnosis not present

## 2023-12-13 DIAGNOSIS — C19 Malignant neoplasm of rectosigmoid junction: Secondary | ICD-10-CM | POA: Insufficient documentation

## 2023-12-13 LAB — CBC WITH DIFFERENTIAL/PLATELET
Abs Immature Granulocytes: 0.01 K/uL (ref 0.00–0.07)
Basophils Absolute: 0 K/uL (ref 0.0–0.1)
Basophils Relative: 1 %
Eosinophils Absolute: 0.1 K/uL (ref 0.0–0.5)
Eosinophils Relative: 4 %
HCT: 33.3 % — ABNORMAL LOW (ref 39.0–52.0)
Hemoglobin: 11.5 g/dL — ABNORMAL LOW (ref 13.0–17.0)
Immature Granulocytes: 0 %
Lymphocytes Relative: 42 %
Lymphs Abs: 1.3 K/uL (ref 0.7–4.0)
MCH: 32.7 pg (ref 26.0–34.0)
MCHC: 34.5 g/dL (ref 30.0–36.0)
MCV: 94.6 fL (ref 80.0–100.0)
Monocytes Absolute: 0.5 K/uL (ref 0.1–1.0)
Monocytes Relative: 17 %
Neutro Abs: 1.1 K/uL — ABNORMAL LOW (ref 1.7–7.7)
Neutrophils Relative %: 36 %
Platelets: 203 K/uL (ref 150–400)
RBC: 3.52 MIL/uL — ABNORMAL LOW (ref 4.22–5.81)
RDW: 22.1 % — ABNORMAL HIGH (ref 11.5–15.5)
Smear Review: NORMAL
WBC: 3.1 K/uL — ABNORMAL LOW (ref 4.0–10.5)
nRBC: 0 % (ref 0.0–0.2)

## 2023-12-13 LAB — COMPREHENSIVE METABOLIC PANEL WITH GFR
ALT: 24 U/L (ref 0–44)
AST: 33 U/L (ref 15–41)
Albumin: 3.8 g/dL (ref 3.5–5.0)
Alkaline Phosphatase: 64 U/L (ref 38–126)
Anion gap: 12 (ref 5–15)
BUN: 9 mg/dL (ref 6–20)
CO2: 21 mmol/L — ABNORMAL LOW (ref 22–32)
Calcium: 9.2 mg/dL (ref 8.9–10.3)
Chloride: 104 mmol/L (ref 98–111)
Creatinine, Ser: 1.1 mg/dL (ref 0.61–1.24)
GFR, Estimated: >60 mL/min (ref >60)
Glucose, Bld: 237 mg/dL — ABNORMAL HIGH (ref 70–99)
Potassium: 4.1 mmol/L (ref 3.5–5.1)
Sodium: 137 mmol/L (ref 135–145)
Total Bilirubin: 1.1 mg/dL (ref 0.0–1.2)
Total Protein: 7.2 g/dL (ref 6.5–8.1)

## 2023-12-13 LAB — MAGNESIUM: Magnesium: 1.7 mg/dL (ref 1.7–2.4)

## 2023-12-13 MED ORDER — OXALIPLATIN CHEMO INJECTION 100 MG/20ML
130.0000 mg/m2 | Freq: Once | INTRAVENOUS | Status: AC
  Administered 2023-12-13: 250 mg via INTRAVENOUS
  Filled 2023-12-13: qty 40

## 2023-12-13 MED ORDER — DEXTROSE 5 % IV SOLN: INTRAVENOUS | Status: DC

## 2023-12-13 MED ORDER — SODIUM CHLORIDE 0.9% FLUSH
10.0000 mL | INTRAVENOUS | Status: DC | PRN
  Administered 2023-12-13: 10 mL via INTRAVENOUS

## 2023-12-13 MED ORDER — PALONOSETRON HCL INJECTION 0.25 MG/5ML
0.2500 mg | Freq: Once | INTRAVENOUS | Status: AC
  Administered 2023-12-13: 0.25 mg via INTRAVENOUS
  Filled 2023-12-13: qty 5

## 2023-12-13 MED ORDER — CETIRIZINE HCL 10 MG/ML IV SOLN
10.0000 mg | Freq: Once | INTRAVENOUS | Status: AC
  Administered 2023-12-13: 10 mg via INTRAVENOUS
  Filled 2023-12-13: qty 1

## 2023-12-13 MED ORDER — BACLOFEN 10 MG PO TABS: 10.0000 mg | ORAL_TABLET | Freq: Three times a day (TID) | ORAL | 0 refills | Status: AC | PRN

## 2023-12-13 MED ORDER — SODIUM CHLORIDE 0.9 % IV SOLN
150.0000 mg | Freq: Once | INTRAVENOUS | Status: AC
  Administered 2023-12-13: 150 mg via INTRAVENOUS
  Filled 2023-12-13: qty 5

## 2023-12-13 MED ORDER — SODIUM CHLORIDE 0.9% FLUSH
10.0000 mL | INTRAVENOUS | Status: DC | PRN
Start: 2023-12-13 — End: 2023-12-13
  Administered 2023-12-13: 10 mL

## 2023-12-13 MED ORDER — HEPARIN SOD (PORK) LOCK FLUSH 100 UNIT/ML IV SOLN
500.0000 [IU] | Freq: Once | INTRAVENOUS | Status: AC | PRN
Start: 2023-12-13 — End: 2023-12-13
  Administered 2023-12-13: 500 [IU]

## 2023-12-13 MED ORDER — DEXAMETHASONE SODIUM PHOSPHATE 10 MG/ML IJ SOLN
10.0000 mg | Freq: Once | INTRAMUSCULAR | Status: AC
  Administered 2023-12-13: 10 mg via INTRAVENOUS
  Filled 2023-12-13: qty 1

## 2023-12-13 NOTE — Patient Instructions (Signed)

## 2023-12-13 NOTE — Patient Instructions (Signed)
 [facility] [place] - A DEPT OF [facility]  Discharge Instructions: Thank you for choosing [facility] to provide your oncology and hematology care.  If you have a lab appointment with the Cancer Center - please note that after April 8th, 2024, all labs will be drawn in the cancer center.  You do not have to check in or register with the main entrance as you have in the past but will complete your check-in in the cancer center.  Wear comfortable clothing and clothing appropriate for easy access to any Portacath or PICC line.   We strive to give you quality time with your provider. You may need to reschedule your appointment if you arrive late (15 or more minutes).  Arriving late affects you and other patients whose appointments are after yours.  Also, if you miss three or more appointments without notifying the office, you may be dismissed from the clinic at the provider's discretion.      For prescription refill requests, have your pharmacy contact our office and allow 72 hours for refills to be completed.    Today you received the following chemotherapy and/or immunotherapy agents oxaliplatin   To help prevent nausea and vomiting after your treatment, we encourage you to take your nausea medication as directed.  BELOW ARE SYMPTOMS THAT SHOULD BE REPORTED IMMEDIATELY: *FEVER GREATER THAN 100.4 F (38 C) OR HIGHER *CHILLS OR SWEATING *NAUSEA AND VOMITING THAT IS NOT CONTROLLED WITH YOUR NAUSEA MEDICATION *UNUSUAL SHORTNESS OF BREATH *UNUSUAL BRUISING OR BLEEDING *URINARY PROBLEMS (pain or burning when urinating, or frequent urination) *BOWEL PROBLEMS (unusual diarrhea, constipation, pain near the anus) TENDERNESS IN MOUTH AND THROAT WITH OR WITHOUT PRESENCE OF ULCERS (sore throat, sores in mouth, or a toothache) UNUSUAL RASH, SWELLING OR PAIN  UNUSUAL VAGINAL DISCHARGE OR ITCHING   Items with * indicate a potential emergency and should be followed up  as soon as possible or go to the Emergency Department if any problems should occur.  Please show the CHEMOTHERAPY ALERT CARD or IMMUNOTHERAPY ALERT CARD at check-in to the Emergency Department and triage nurse.  Should you have questions after your visit or need to cancel or reschedule your appointment, please contact [facility] [place] - A DEPT OF [facility] [phone]  and follow the prompts.  Office hours are 8:00 a.m. to 4:30 p.m. Monday - Friday. Please note that voicemails left after 4:00 p.m. may not be returned until the following business day.  We are closed weekends and major holidays. You have access to a nurse at all times for urgent questions. Please call the main number to the clinic [phone] and follow the prompts.  For any non-urgent questions, you may also contact your provider using MyChart. We now offer e-Visits for anyone 82 and older to request care online for non-urgent symptoms. For details visit [email or web address].   Also download the MyChart app! Go to the app store, search "MyChart", open the app, select [place], and log in with your MyChart username and password.

## 2023-12-13 NOTE — Progress Notes (Signed)
 Patients port flushed without difficulty.  Good blood return noted with no bruising or swelling noted at site.  Patient remains accessed for treatment.

## 2023-12-13 NOTE — Progress Notes (Signed)

## 2023-12-13 NOTE — Progress Notes (Signed)
 [facility] [address] [place] [address]   Clinic Day:  12/13/2023  Referring physician: Corrington, Kip A, MD  Patient Care Team: Randall Murphy LABOR, MD as PCP - General (Family Medicine) Randall Lamar HERO, MD as Consulting Physician (Gastroenterology) Randall Ross Lacy Phebe, MD as Referring Physician (Optometry)   ASSESSMENT & PLAN:   Assessment:  1.  Stage III (T3 N1) recto-sigmoid colon adenocarcinoma: - Presentation: Rectal bleeding, seen by GI Dr. Rosalynn Pottier - Colonoscopy (07/19/2023): Mild pancolonic diverticulosis.  Murphy single sessile 1 cm nonbleeding polyp of benign appearance found in the transverse colon.  An ulcerated bleeding 8 cm mass of malignant appearance was found in the sigmoid colon.  Mass caused partial obstruction. - CEA (07/20/2023): 39.4, (09/05/2023): 10.6 - CT CAP (07/25/2023): Mass within the sigmoid colon.  Few mildly enlarged lymph nodes adjacent to the sigmoid colon.  Tiny left upper lobe pulmonary nodule, indeterminate. - S/p robotic LAR on 08/23/2023 by Dr. Con [facility] health) - Pathology: Invasive colonic adenocarcinoma, moderately differentiated arising in Murphy tubulovillous adenoma, tumor size 6.8 x 5.6 cm.  Tumor location: Distal sigmoid/rectosigmoid colon.  1/31 lymph nodes positive.  All margins negative.  LVI-not identified.  PNI-present.  pT3 pN1a.  IHC for DNA mismatch repair proteins shows retained nuclear expression of all 4 mismatch repair proteins. - Cancer next expanded +RNA insight panel Randall Murphy): Negative - I have called and talked to the pathologist.  Per the OR report and surgical pathology, tumor is at distal sigmoid/rectosigmoid area. - I have recommended 4 months of chemotherapy with CapeOx followed by chemoradiation with Xeloda . - Cycle 1 of CapeOx on 09/20/2023 - Dr. Con called me during the week of 10/01/2023.  He told me that the tumor location was mostly in the distal sigmoid region.  He was very confident that he had  all the lymph nodes out and recommended that we may be able to avoid radiation.  I have recommended that we will plan for total of 6 months of chemotherapy if we are avoiding radiation.  2.  Social/family history: - Lives at home with his wife Randall Murphy.  He works Paramedic job.  Non-smoker. - Maternal grandfather had brain tumor.  Paternal aunt had pancreatic cancer.   Plan:  1.  Stage III (T3 N1) recto-sigmoid colon adenocarcinoma, MMR preserved: - He received cycle 4 of CapeOx on 11/23/2023. - Diarrhea is better controlled with Imodium. - He developed hiccups after last treatment which lasted about 3 to 4 days. - Will give baclofen  10 mg 3 times daily if she develops recurrent hiccups. - Reviewed labs today: Normal LFTs and creatinine.  CBC grossly normal with mild leukopenia of 3.1 with ANC of 1100.  Differential shows 17% monocytes.  He may proceed with cycle 5 today.  Continue capecitabine  4 tablets twice daily 2 weeks on/1 week off. - Starting next cycle, he will have CBC D and other labs done 1 day prior to treatment.  If he needs G-CSF we can give it to him.  Will need to extend his chemotherapy to 8 cycles as we are not doing radiation.  2.  Peripheral neuropathy: - Toe numbness present prior to chemotherapy is stable.  He has cold sensitivity lasting about 5 to 7 days.  No numbness in fingertips reported.  3.  Diabetes: - Ozempic  on hold until he finishes chemotherapy.  Blood sugar today is 237.   No orders of the defined types were placed in this encounter.  Randall Murphy,acting as Murphy Neurosurgeon for Randall Stands, MD.,have documented all relevant documentation on the behalf of Randall Stands, MD,as directed by  Randall Stands, MD while in the presence of Randall Stands, MD.  I, Randall Stands MD, have reviewed the above documentation for accuracy and completeness, and I agree with the above.      Randall Stands, MD   7/17/20252:21  PM  CHIEF COMPLAINT/PURPOSE OF CONSULT:   Diagnosis: sigmoid colon cancer    Cancer Staging  Rectal adenocarcinoma [facility]) Staging form: Colon and Rectum, AJCC 8th Edition - Clinical stage from 09/10/2023: Stage IIIB (cT3, cN1a, cM0) - Signed by Murphy Alean, MD on 09/10/2023    Prior Therapy: Resection on 08/23/23  Current Therapy: CapeOx   HISTORY OF PRESENT ILLNESS:   Oncology History  Rectal adenocarcinoma (HCC)  09/10/2023 Initial Diagnosis   Rectal adenocarcinoma (HCC)   09/10/2023 Cancer Staging   Staging form: Colon and Rectum, AJCC 8th Edition - Clinical stage from 09/10/2023: Stage IIIB (cT3, cN1a, cM0) - Signed by Murphy Alean, MD on 09/10/2023 Histopathologic type: Adenocarcinoma, NOS Stage prefix: Initial diagnosis Total positive nodes: 1 Total nodes examined: 31 Histologic grade (G): G2 Histologic grading system: 4 grade system   09/20/2023 -  Chemotherapy   Patient is on Treatment Plan : RECTAL Xelox (Capeox) (130/850) q21d x 6 cycles         Randall Murphy is Murphy 43 y.o. male presenting to clinic today for evaluation of sigmoid colon cancer at the request of Dr. Con.  He initially presented to his PCP on 06/19/23 with rectal bleeding. He was referred to Dr. Frutoso in GI at [facility] and underwent colonoscopy on 07/19/23. He was found to have Murphy malignant-appearing partially-obstructing mass in sigmoid colon. He underwent staging CT C/Murphy/P on 07/25/23 showing: 3 cm mass within sigmoid colon; few mildly enlarged lymph nodes adjacent to sigmoid colon, with index partially necrotic lymph node measuring 1.7 cm; tiny indeterminate LUL pulmonary nodule.  He was referred to colorectal surgery at [facility] and was taken for resection on 08/23/23 under Dr. Con. Pathology confirmed 6.8 cm invasive colonic adenocarcinoma, moderately differentiated, arising in Murphy tubulovillous adenoma. Out of 31 biopsied lymph nodes, one was positive for metastatic disease.  Today, he states  that he is doing well overall. His appetite level is at 100%. His energy level is at 50%.  INTERVAL HISTORY:   Randall Murphy is Murphy 43 y.o. male presenting to the clinic today for follow-up of sigmoid colon cancer. He was last seen by me on 11/22/2023.  Today, he states that he is doing well overall. His appetite level is at 100%. His energy level is at 85%. Tin is accompanied by his wife.   He tolerated cycle 4 of chemotherapy well. He reports tiredness and hiccups for 3-4 days after treatment that resolved on its own. He attempted to treat hiccups with OTC medications like Rolaids, but states it was ineffective. Diarrhea is well controlled with Imodium. He denies any mouth sores or significant changes in taste. Hanan states he has mild change in taste with foods cooked in oil, which prevents him from eating things such as fried foods.  Eshaan reports back pain lasting 1 day after G-CSF injection. He did take Claritin after the injection.   PAST MEDICAL HISTORY:   Past Medical History: Past Medical History:  Diagnosis Date   ADD (attention deficit disorder) 02/22/2015   Anxiety, generalized 02/15/2016   Diabetes mellitus without complication (HCC)    Dyslipidemia 02/22/2015  Elevated liver enzymes    Fatty liver 03/23/2015   Hypertension     Surgical History: Past Surgical History:  Procedure Laterality Date   IR IMAGING GUIDED PORT INSERTION  09/07/2023    Social History: Social History   Socioeconomic History   Marital status: Married    Spouse name: Not on file   Number of children: 2   Years of education: Not on file   Highest education level: Not on file  Occupational History   Occupation: IT  Tobacco Use   Smoking status: Never   Smokeless tobacco: Never  Vaping Use   Vaping status: Never Used  Substance and Sexual Activity   Alcohol use: Yes    Alcohol/week: 4.0 standard drinks of alcohol    Types: 4 Cans of beer per week    Comment: daily   Drug use: Never    Sexual activity: Yes    Birth control/protection: Condom  Other Topics Concern   Not on file  Social History Narrative   Not on file   Social Drivers of Health   Financial Resource Strain: Low Risk  (07/20/2023)   Received from [facility]   Overall Financial Resource Strain (CARDIA)    Difficulty of Paying Living Expenses: Not very hard  Food Insecurity: No Food Insecurity (08/23/2023)   Received from [facility]   Hunger Vital Sign    Within the past 12 months, you worried that your food would run out before you got the money to buy more.: Never true    Within the past 12 months, the food you bought just didn't last and you didn't have money to get more.: Never true  Transportation Needs: No Transportation Needs (08/23/2023)   Received from [facility] - Transportation    Lack of Transportation (Medical): No    Lack of Transportation (Non-Medical): No  Physical Activity: Insufficiently Active (07/20/2023)   Received from [facility]   Exercise Vital Sign    On average, how many days per week do you engage in moderate to strenuous exercise (like Murphy brisk walk)?: 2 days    On average, how many minutes do you engage in exercise at this level?: 20 min  Stress: No Stress Concern Present (08/23/2023)   Received from [facility] - Occupational Stress Questionnaire    Feeling of Stress : Not at all  Social Connections: Moderately Integrated (07/20/2023)   Received from [facility]   Social Network    How would you rate your social network (family, work, friends)?: Adequate participation with social networks  Intimate Partner Violence: Not At Risk (08/23/2023)   Received from [facility]   HITS    Over the last 12 months how often did your partner physically hurt you?: Never    Over the last 12 months how often did your partner insult you or talk down to you?: Never    Over the last 12 months how often did your partner  threaten you with physical harm?: Never    Over the last 12 months how often did your partner scream or curse at you?: Never    Family History: Family History  Problem Relation Age of Onset   Arthritis Mother    Diabetes Father    Hypertension Father    Stroke Father    Kidney disease Father        ESRD on dialysis   Diabetes Sister    Hypertension Brother  Diabetes Maternal Grandmother    Brain cancer Maternal Grandfather    Heart attack Paternal Grandfather     Current Medications:  Current Outpatient Medications:    buPROPion (WELLBUTRIN XL) 150 MG 24 hr tablet, Take 150 mg by mouth., Disp: , Rfl:    capecitabine  (XELODA ) 500 MG tablet, Take 4 tablets (2,000 mg total) by mouth 2 (two) times daily after Murphy meal. Take 2 weeks on/1 week off every 3 weeks x 6, Disp: 112 tablet, Rfl: 5   Continuous Blood Gluc Sensor (FREESTYLE LIBRE 3 SENSOR) MISC, 1 Device by Does not apply route every 14 (fourteen) days. Place 1 sensor on the skin every 14 days. Use to check glucose continuously, Disp: 2 each, Rfl: 2   escitalopram  (LEXAPRO ) 10 MG tablet, Take 1 tablet (10 mg total) by mouth daily. (NEEDS TO BE SEEN BEFORE NEXT REFILL), Disp: 30 tablet, Rfl: 0   lidocaine -prilocaine  (EMLA ) cream, Apply to affected area once, Disp: 30 g, Rfl: 3   lisinopril  (ZESTRIL ) 10 MG tablet, Take 1 tablet (10 mg total) by mouth daily., Disp: 90 tablet, Rfl: 3   oxyCODONE (OXY IR/ROXICODONE) 5 MG immediate release tablet, Take 5 mg by mouth every 4 (four) hours as needed., Disp: , Rfl:    pravastatin  (PRAVACHOL ) 40 MG tablet, Take 1 tablet (40 mg total) by mouth daily. (NEEDS TO BE SEEN BEFORE NEXT REFILL), Disp: 30 tablet, Rfl: 0   prochlorperazine  (COMPAZINE ) 10 MG tablet, Take 1 tablet (10 mg total) by mouth every 6 (six) hours as needed for nausea or vomiting., Disp: 60 tablet, Rfl: 3   Semaglutide , 2 MG/DOSE, (OZEMPIC , 2 MG/DOSE,) 8 MG/3ML SOPN, Inject 2 mg into the skin once Murphy week. (NEEDS TO BE SEEN BEFORE  NEXT REFILL), Disp: 3 mL, Rfl: 0   VYVANSE  40 MG capsule, Take by mouth., Disp: , Rfl:  No current facility-administered medications for this visit.  Facility-Administered Medications Ordered in Other Visits:    dextrose  5 % solution, , Intravenous, Continuous, Rogers Hai, MD, Last Rate: 10 mL/hr at 12/13/23 1105, New Bag at 12/13/23 1105   heparin  lock flush 100 unit/mL, 500 Units, Intracatheter, Once PRN, Satin Boal, MD   oxaliplatin  (ELOXATIN ) 250 mg in dextrose  5 % 500 mL chemo infusion, 130 mg/m2 (Treatment Plan Recorded), Intravenous, Once, Rogers Hai, MD, Last Rate: 275 mL/hr at 12/13/23 1238, 250 mg at 12/13/23 1238   sodium chloride  flush (NS) 0.9 % injection 10 mL, 10 mL, Intracatheter, PRN, Chelcie Estorga, MD   Allergies: No Known Allergies  REVIEW OF SYSTEMS:   Review of Systems  Constitutional:  Negative for chills, fatigue and fever.  HENT:   Negative for lump/mass, mouth sores, nosebleeds, sore throat and trouble swallowing.   Eyes:  Negative for eye problems.  Respiratory:  Positive for shortness of breath (with exertion). Negative for cough.   Cardiovascular:  Negative for chest pain, leg swelling and palpitations.  Gastrointestinal:  Positive for diarrhea (occasional) and nausea (occasional). Negative for abdominal pain, constipation and vomiting.  Genitourinary:  Negative for bladder incontinence, difficulty urinating, dysuria, frequency, hematuria and nocturia.   Musculoskeletal:  Negative for arthralgias, back pain, flank pain, myalgias and neck pain.  Skin:  Negative for itching and rash.  Neurological:  Negative for dizziness, headaches and numbness.  Hematological:  Does not bruise/bleed easily.  Psychiatric/Behavioral:  Negative for depression, sleep disturbance and suicidal ideas. The patient is not nervous/anxious.   All other systems reviewed and are negative.    VITALS:  There were no vitals taken for this visit.  Wt  Readings from Last 3 Encounters:  12/13/23 189 lb 14.4 oz (86.1 kg)  11/22/23 187 lb (84.8 kg)  11/01/23 189 lb (85.7 kg)    There is no height or weight on file to calculate BMI.  Performance status (ECOG): 0 - Asymptomatic  PHYSICAL EXAM:   Physical Exam Vitals and nursing note reviewed. Exam conducted with Murphy chaperone present.  Constitutional:      Appearance: Normal appearance.  Cardiovascular:     Rate and Rhythm: Normal rate and regular rhythm.     Pulses: Normal pulses.     Heart sounds: Normal heart sounds.  Pulmonary:     Effort: Pulmonary effort is normal.     Breath sounds: Normal breath sounds.  Abdominal:     Palpations: Abdomen is soft. There is no hepatomegaly, splenomegaly or mass.     Tenderness: There is no abdominal tenderness.  Musculoskeletal:     Right lower leg: No edema.     Left lower leg: No edema.  Lymphadenopathy:     Cervical: No cervical adenopathy.     Right cervical: No superficial, deep or posterior cervical adenopathy.    Left cervical: No superficial, deep or posterior cervical adenopathy.     Upper Body:     Right upper body: No supraclavicular or axillary adenopathy.     Left upper body: No supraclavicular or axillary adenopathy.  Neurological:     General: No focal deficit present.     Mental Status: He is alert and oriented to person, place, and time.  Psychiatric:        Mood and Affect: Mood normal.        Behavior: Behavior normal.   Laparoscopic surgical sites are healing well.  LABS:   CBC    Component Value Date/Time   WBC 3.1 (L) 12/13/2023 0926   RBC 3.52 (L) 12/13/2023 0926   HGB 11.5 (L) 12/13/2023 0926   HGB 14.2 05/18/2022 0938   HCT 33.3 (L) 12/13/2023 0926   HCT 42.4 05/18/2022 0938   PLT 203 12/13/2023 0926   PLT 298 05/18/2022 0938   MCV 94.6 12/13/2023 0926   MCV 81 05/18/2022 0938   MCH 32.7 12/13/2023 0926   MCHC 34.5 12/13/2023 0926   RDW 22.1 (H) 12/13/2023 0926   RDW 12.3 05/18/2022 0938    LYMPHSABS 1.3 12/13/2023 0926   LYMPHSABS 1.8 05/18/2022 0938   MONOABS 0.5 12/13/2023 0926   EOSABS 0.1 12/13/2023 0926   EOSABS 0.3 05/18/2022 0938   BASOSABS 0.0 12/13/2023 0926   BASOSABS 0.1 05/18/2022 0938    CMP    Component Value Date/Time   NA 137 12/13/2023 0926   NA 137 05/18/2022 0848   K 4.1 12/13/2023 0926   CL 104 12/13/2023 0926   CO2 21 (L) 12/13/2023 0926   GLUCOSE 237 (H) 12/13/2023 0926   BUN 9 12/13/2023 0926   BUN 6 05/18/2022 0848   CREATININE 1.10 12/13/2023 0926   CALCIUM  9.2 12/13/2023 0926   PROT 7.2 12/13/2023 0926   PROT 7.6 05/18/2022 0848   ALBUMIN 3.8 12/13/2023 0926   ALBUMIN 4.7 05/18/2022 0848   AST 33 12/13/2023 0926   ALT 24 12/13/2023 0926   ALKPHOS 64 12/13/2023 0926   BILITOT 1.1 12/13/2023 0926   BILITOT 0.8 05/18/2022 0848   GFRNONAA >60 12/13/2023 0926   GFRAA 102 01/22/2020 0924     Lab Results  Component Value Date   CEA1  4.5 10/10/2023   /  CEA  Date Value Ref Range Status  10/10/2023 4.5 0.0 - 4.7 ng/mL Final    Comment:    (NOTE)                             Nonsmokers          <3.9                             Smokers             <5.6 Roche [employer] Electrochemiluminescence Immunoassay (ECLIA) Values obtained with different assay methods or kits cannot be used interchangeably.  Results cannot be interpreted as absolute evidence of the presence or absence of malignant disease. Performed At: [facility] [address] [place], [place] [id number] Jennette Shorter MD Ey:[id number]    No results found for: PSA1 No results found for: CAN199 No results found for: CAN125  No results found for: TOTALPROTELP, ALBUMINELP, A1GS, A2GS, BETS, BETA2SER, GAMS, MSPIKE, SPEI Lab Results  Component Value Date   TIBC 463 (H) 09/05/2023   FERRITIN 299 09/05/2023   IRONPCTSAT 29 09/05/2023   No results found for: LDH   STUDIES:   No results found.

## 2023-12-13 NOTE — Progress Notes (Signed)
 Patient has been examined by Dr. Rogers. Vital signs and labs have been reviewed by MD - ANC (1100), Creatinine, LFTs, hemoglobin, and platelets are within treatment parameters per M.D. - pt may proceed with treatment.  Primary RN and pharmacy notified.

## 2023-12-18 ENCOUNTER — Other Ambulatory Visit: Payer: Self-pay

## 2023-12-19 ENCOUNTER — Other Ambulatory Visit: Payer: Self-pay

## 2024-01-02 ENCOUNTER — Inpatient Hospital Stay: Attending: Hematology

## 2024-01-02 VITALS — BP 122/92 | HR 90 | Temp 96.6°F | Resp 18 | Wt 190.7 lb

## 2024-01-02 DIAGNOSIS — Z823 Family history of stroke: Secondary | ICD-10-CM | POA: Diagnosis not present

## 2024-01-02 DIAGNOSIS — Z8261 Family history of arthritis: Secondary | ICD-10-CM | POA: Diagnosis not present

## 2024-01-02 DIAGNOSIS — Z5111 Encounter for antineoplastic chemotherapy: Secondary | ICD-10-CM | POA: Insufficient documentation

## 2024-01-02 DIAGNOSIS — Z833 Family history of diabetes mellitus: Secondary | ICD-10-CM | POA: Diagnosis not present

## 2024-01-02 DIAGNOSIS — K760 Fatty (change of) liver, not elsewhere classified: Secondary | ICD-10-CM | POA: Diagnosis not present

## 2024-01-02 DIAGNOSIS — Z79630 Long term (current) use of alkylating agent: Secondary | ICD-10-CM | POA: Insufficient documentation

## 2024-01-02 DIAGNOSIS — Z800 Family history of malignant neoplasm of digestive organs: Secondary | ICD-10-CM | POA: Insufficient documentation

## 2024-01-02 DIAGNOSIS — Z8249 Family history of ischemic heart disease and other diseases of the circulatory system: Secondary | ICD-10-CM | POA: Diagnosis not present

## 2024-01-02 DIAGNOSIS — R599 Enlarged lymph nodes, unspecified: Secondary | ICD-10-CM | POA: Insufficient documentation

## 2024-01-02 DIAGNOSIS — R911 Solitary pulmonary nodule: Secondary | ICD-10-CM | POA: Diagnosis not present

## 2024-01-02 DIAGNOSIS — F411 Generalized anxiety disorder: Secondary | ICD-10-CM | POA: Diagnosis not present

## 2024-01-02 DIAGNOSIS — D702 Other drug-induced agranulocytosis: Secondary | ICD-10-CM

## 2024-01-02 DIAGNOSIS — R066 Hiccough: Secondary | ICD-10-CM | POA: Diagnosis not present

## 2024-01-02 DIAGNOSIS — C20 Malignant neoplasm of rectum: Secondary | ICD-10-CM

## 2024-01-02 DIAGNOSIS — D61818 Other pancytopenia: Secondary | ICD-10-CM | POA: Diagnosis not present

## 2024-01-02 DIAGNOSIS — C19 Malignant neoplasm of rectosigmoid junction: Secondary | ICD-10-CM | POA: Diagnosis present

## 2024-01-02 DIAGNOSIS — E785 Hyperlipidemia, unspecified: Secondary | ICD-10-CM | POA: Insufficient documentation

## 2024-01-02 DIAGNOSIS — K625 Hemorrhage of anus and rectum: Secondary | ICD-10-CM | POA: Diagnosis not present

## 2024-01-02 DIAGNOSIS — E119 Type 2 diabetes mellitus without complications: Secondary | ICD-10-CM | POA: Insufficient documentation

## 2024-01-02 DIAGNOSIS — G629 Polyneuropathy, unspecified: Secondary | ICD-10-CM | POA: Diagnosis not present

## 2024-01-02 DIAGNOSIS — K5730 Diverticulosis of large intestine without perforation or abscess without bleeding: Secondary | ICD-10-CM | POA: Diagnosis not present

## 2024-01-02 DIAGNOSIS — I10 Essential (primary) hypertension: Secondary | ICD-10-CM | POA: Insufficient documentation

## 2024-01-02 DIAGNOSIS — Z79899 Other long term (current) drug therapy: Secondary | ICD-10-CM | POA: Insufficient documentation

## 2024-01-02 LAB — CBC WITH DIFFERENTIAL/PLATELET
Abs Immature Granulocytes: 0 K/uL (ref 0.00–0.07)
Basophils Absolute: 0 K/uL (ref 0.0–0.1)
Basophils Relative: 1 %
Eosinophils Absolute: 0.1 K/uL (ref 0.0–0.5)
Eosinophils Relative: 4 %
HCT: 34.4 % — ABNORMAL LOW (ref 39.0–52.0)
Hemoglobin: 12.2 g/dL — ABNORMAL LOW (ref 13.0–17.0)
Immature Granulocytes: 0 %
Lymphocytes Relative: 38 %
Lymphs Abs: 1.2 K/uL (ref 0.7–4.0)
MCH: 35.4 pg — ABNORMAL HIGH (ref 26.0–34.0)
MCHC: 35.5 g/dL (ref 30.0–36.0)
MCV: 99.7 fL (ref 80.0–100.0)
Monocytes Absolute: 0.5 K/uL (ref 0.1–1.0)
Monocytes Relative: 16 %
Neutro Abs: 1.3 K/uL — ABNORMAL LOW (ref 1.7–7.7)
Neutrophils Relative %: 41 %
Platelets: 163 K/uL (ref 150–400)
RBC: 3.45 MIL/uL — ABNORMAL LOW (ref 4.22–5.81)
RDW: 19.3 % — ABNORMAL HIGH (ref 11.5–15.5)
WBC: 3.3 K/uL — ABNORMAL LOW (ref 4.0–10.5)
nRBC: 0 % (ref 0.0–0.2)

## 2024-01-02 LAB — COMPREHENSIVE METABOLIC PANEL WITH GFR
ALT: 29 U/L (ref 0–44)
AST: 40 U/L (ref 15–41)
Albumin: 3.9 g/dL (ref 3.5–5.0)
Alkaline Phosphatase: 70 U/L (ref 38–126)
Anion gap: 13 (ref 5–15)
BUN: 8 mg/dL (ref 6–20)
CO2: 22 mmol/L (ref 22–32)
Calcium: 9.7 mg/dL (ref 8.9–10.3)
Chloride: 100 mmol/L (ref 98–111)
Creatinine, Ser: 0.89 mg/dL (ref 0.61–1.24)
GFR, Estimated: >60 mL/min (ref >60)
Glucose, Bld: 243 mg/dL — ABNORMAL HIGH (ref 70–99)
Potassium: 3.8 mmol/L (ref 3.5–5.1)
Sodium: 135 mmol/L (ref 135–145)
Total Bilirubin: 1.1 mg/dL (ref 0.0–1.2)
Total Protein: 7.2 g/dL (ref 6.5–8.1)

## 2024-01-02 LAB — MAGNESIUM: Magnesium: 1.8 mg/dL (ref 1.7–2.4)

## 2024-01-02 MED ORDER — FILGRASTIM-SNDZ 480 MCG/0.8ML IJ SOSY
480.0000 ug | PREFILLED_SYRINGE | Freq: Once | INTRAMUSCULAR | Status: AC
  Administered 2024-01-02: 480 ug via SUBCUTANEOUS
  Filled 2024-01-02: qty 0.8

## 2024-01-02 NOTE — Patient Instructions (Signed)
 [facility] - A DEPT OF [facility]  Discharge Instructions: Thank you for choosing [facility] to provide your oncology and hematology care.  If you have a lab appointment with the Cancer Center - please note that after April 8th, 2024, all labs will be drawn in the cancer center.  You do not have to check in or register with the main entrance as you have in the past but will complete your check-in in the cancer center.  Wear comfortable clothing and clothing appropriate for easy access to any Portacath or PICC line.   We strive to give you quality time with your provider. You may need to reschedule your appointment if you arrive late (15 or more minutes).  Arriving late affects you and other patients whose appointments are after yours.  Also, if you miss three or more appointments without notifying the office, you may be dismissed from the clinic at the provider's discretion.      For prescription refill requests, have your pharmacy contact our office and allow 72 hours for refills to be completed.    Today you received the following chemotherapy and/or immunotherapy agents [email or web address]      To help prevent nausea and vomiting after your treatment, we encourage you to take your nausea medication as directed.  BELOW ARE SYMPTOMS THAT SHOULD BE REPORTED IMMEDIATELY: *FEVER GREATER THAN 100.4 F (38 C) OR HIGHER *CHILLS OR SWEATING *NAUSEA AND VOMITING THAT IS NOT CONTROLLED WITH YOUR NAUSEA MEDICATION *UNUSUAL SHORTNESS OF BREATH *UNUSUAL BRUISING OR BLEEDING *URINARY PROBLEMS (pain or burning when urinating, or frequent urination) *BOWEL PROBLEMS (unusual diarrhea, constipation, pain near the anus) TENDERNESS IN MOUTH AND THROAT WITH OR WITHOUT PRESENCE OF ULCERS (sore throat, sores in mouth, or a toothache) UNUSUAL RASH, SWELLING OR PAIN  UNUSUAL VAGINAL DISCHARGE OR ITCHING   Items with * indicate a potential emergency and should be followed up as  soon as possible or go to the Emergency Department if any problems should occur.  Please show the CHEMOTHERAPY ALERT CARD or IMMUNOTHERAPY ALERT CARD at check-in to the Emergency Department and triage nurse.  Should you have questions after your visit or need to cancel or reschedule your appointment, please contact [facility] [place] - A DEPT OF [facility] [phone]  and follow the prompts.  Office hours are 8:00 a.m. to 4:30 p.m. Monday - Friday. Please note that voicemails left after 4:00 p.m. may not be returned until the following business day.  We are closed weekends and major holidays. You have access to a nurse at all times for urgent questions. Please call the main number to the clinic [phone] and follow the prompts.  For any non-urgent questions, you may also contact your provider using MyChart. We now offer e-Visits for anyone 54 and older to request care online for non-urgent symptoms. For details visit [email or web address].   Also download the MyChart app! Go to the app store, search MyChart, open the app, select [place], and log in with your MyChart username and password.

## 2024-01-03 ENCOUNTER — Other Ambulatory Visit: Payer: Self-pay | Admitting: Hematology

## 2024-01-03 ENCOUNTER — Inpatient Hospital Stay

## 2024-01-03 ENCOUNTER — Inpatient Hospital Stay: Admitting: Hematology

## 2024-01-03 ENCOUNTER — Encounter: Payer: Self-pay | Admitting: Hematology

## 2024-01-03 VITALS — BP 107/77 | HR 90 | Temp 98.0°F | Resp 18 | Wt 188.1 lb

## 2024-01-03 VITALS — BP 120/83 | HR 96 | Temp 97.6°F | Resp 18

## 2024-01-03 DIAGNOSIS — C20 Malignant neoplasm of rectum: Secondary | ICD-10-CM

## 2024-01-03 DIAGNOSIS — D702 Other drug-induced agranulocytosis: Secondary | ICD-10-CM

## 2024-01-03 DIAGNOSIS — Z5111 Encounter for antineoplastic chemotherapy: Secondary | ICD-10-CM | POA: Diagnosis not present

## 2024-01-03 LAB — CBC WITH DIFFERENTIAL/PLATELET
Abs Immature Granulocytes: 0.13 K/uL — ABNORMAL HIGH (ref 0.00–0.07)
Basophils Absolute: 0.1 K/uL (ref 0.0–0.1)
Basophils Relative: 0 %
Eosinophils Absolute: 0.2 K/uL (ref 0.0–0.5)
Eosinophils Relative: 2 %
HCT: 36.2 % — ABNORMAL LOW (ref 39.0–52.0)
Hemoglobin: 12.4 g/dL — ABNORMAL LOW (ref 13.0–17.0)
Immature Granulocytes: 1 %
Lymphocytes Relative: 10 %
Lymphs Abs: 1.4 K/uL (ref 0.7–4.0)
MCH: 34.5 pg — ABNORMAL HIGH (ref 26.0–34.0)
MCHC: 34.3 g/dL (ref 30.0–36.0)
MCV: 100.8 fL — ABNORMAL HIGH (ref 80.0–100.0)
Monocytes Absolute: 0.9 K/uL (ref 0.1–1.0)
Monocytes Relative: 6 %
Neutro Abs: 11.2 K/uL — ABNORMAL HIGH (ref 1.7–7.7)
Neutrophils Relative %: 81 %
Platelets: 157 K/uL (ref 150–400)
RBC: 3.59 MIL/uL — ABNORMAL LOW (ref 4.22–5.81)
RDW: 18.7 % — ABNORMAL HIGH (ref 11.5–15.5)
WBC: 13.9 K/uL — ABNORMAL HIGH (ref 4.0–10.5)
nRBC: 0 % (ref 0.0–0.2)

## 2024-01-03 MED ORDER — SODIUM CHLORIDE 0.9 % IV SOLN
150.0000 mg | Freq: Once | INTRAVENOUS | Status: AC
  Administered 2024-01-03: 150 mg via INTRAVENOUS
  Filled 2024-01-03: qty 150

## 2024-01-03 MED ORDER — DEXTROSE 5 % IV SOLN
INTRAVENOUS | Status: DC
Start: 2024-01-03 — End: 2024-01-03

## 2024-01-03 MED ORDER — OXALIPLATIN CHEMO INJECTION 100 MG/20ML
130.0000 mg/m2 | Freq: Once | INTRAVENOUS | Status: AC
  Administered 2024-01-03: 250 mg via INTRAVENOUS
  Filled 2024-01-03: qty 40

## 2024-01-03 MED ORDER — SODIUM CHLORIDE 0.9% FLUSH: 10.0000 mL | INTRAVENOUS | Status: DC | PRN

## 2024-01-03 MED ORDER — PALONOSETRON HCL INJECTION 0.25 MG/5ML
0.2500 mg | Freq: Once | INTRAVENOUS | Status: AC
Start: 2024-01-03 — End: 2024-01-03
  Administered 2024-01-03: 0.25 mg via INTRAVENOUS
  Filled 2024-01-03: qty 5

## 2024-01-03 MED ORDER — CETIRIZINE HCL 10 MG/ML IV SOLN
10.0000 mg | Freq: Once | INTRAVENOUS | Status: AC
  Administered 2024-01-03: 10 mg via INTRAVENOUS
  Filled 2024-01-03: qty 1

## 2024-01-03 MED ORDER — DEXAMETHASONE SODIUM PHOSPHATE 10 MG/ML IJ SOLN
10.0000 mg | Freq: Once | INTRAMUSCULAR | Status: AC
  Administered 2024-01-03: 10 mg via INTRAVENOUS
  Filled 2024-01-03: qty 1

## 2024-01-03 NOTE — Progress Notes (Signed)
Patient has been assessed, vital signs and labs have been reviewed by Dr. Katragadda. ANC, Creatinine, LFTs, and Platelets are within treatment parameters per Dr. Katragadda. The patient is good to proceed with treatment at this time. Primary RN and pharmacy aware.  

## 2024-01-03 NOTE — Patient Instructions (Signed)
 [facility] - A DEPT OF [facility]  Discharge Instructions: Thank you for choosing [facility] to provide your oncology and hematology care.  If you have a lab appointment with the Cancer Center - please note that after April 8th, 2024, all labs will be drawn in the cancer center.  You do not have to check in or register with the main entrance as you have in the past but will complete your check-in in the cancer center.  Wear comfortable clothing and clothing appropriate for easy access to any Portacath or PICC line.   We strive to give you quality time with your provider. You may need to reschedule your appointment if you arrive late (15 or more minutes).  Arriving late affects you and other patients whose appointments are after yours.  Also, if you miss three or more appointments without notifying the office, you may be dismissed from the clinic at the provider's discretion.      For prescription refill requests, have your pharmacy contact our office and allow 72 hours for refills to be completed.    Today you received the following chemotherapy and/or immunotherapy agents Oxaliplatin    To help prevent nausea and vomiting after your treatment, we encourage you to take your nausea medication as directed.  Oxaliplatin  Injection What is this medication? OXALIPLATIN  (ox AL i PLA tin) treats colorectal cancer. It works by slowing down the growth of cancer cells. This medicine may be used for other purposes; ask your health care provider or pharmacist if you have questions. COMMON BRAND NAME(S): Eloxatin  What should I tell my care team before I take this medication? They need to know if you have any of these conditions: Heart disease History of irregular heartbeat or rhythm Liver disease Low blood cell levels (white cells, red cells, and platelets) Lung or breathing disease, such as asthma Take medications that treat or prevent blood  clots Tingling of the fingers, toes, or other nerve disorder An unusual or allergic reaction to oxaliplatin , other medications, foods, dyes, or preservatives If you or your partner are pregnant or trying to get pregnant Breast-feeding How should I use this medication? This medication is injected into a vein. It is given by your care team in a hospital or clinic setting. Talk to your care team about the use of this medication in children. Special care may be needed. Overdosage: If you think you have taken too much of this medicine contact a poison control center or emergency room at once. NOTE: This medicine is only for you. Do not share this medicine with others. What if I miss a dose? Keep appointments for follow-up doses. It is important not to miss a dose. Call your care team if you are unable to keep an appointment. What may interact with this medication? Do not take this medication with any of the following: Cisapride Dronedarone Pimozide Thioridazine This medication may also interact with the following: Aspirin and aspirin-like medications Certain medications that treat or prevent blood clots, such as warfarin, apixaban, dabigatran, and rivaroxaban Cisplatin Cyclosporine Diuretics Medications for infection, such as acyclovir, adefovir, amphotericin B, bacitracin, cidofovir, foscarnet, ganciclovir, gentamicin, pentamidine, vancomycin NSAIDs, medications for pain and inflammation, such as ibuprofen or naproxen Other medications that cause heart rhythm changes Pamidronate Zoledronic acid This list may not describe all possible interactions. Give your health care provider a list of all the medicines, herbs, non-prescription drugs, or dietary supplements you use. Also tell them if you  smoke, drink alcohol, or use illegal drugs. Some items may interact with your medicine. What should I watch for while using this medication? Your condition will be monitored carefully while you are  receiving this medication. You may need blood work while taking this medication. This medication may make you feel generally unwell. This is not uncommon as chemotherapy can affect healthy cells as well as cancer cells. Report any side effects. Continue your course of treatment even though you feel ill unless your care team tells you to stop. This medication may increase your risk of getting an infection. Call your care team for advice if you get a fever, chills, sore throat, or other symptoms of a cold or flu. Do not treat yourself. Try to avoid being around people who are sick. Avoid taking medications that contain aspirin, acetaminophen , ibuprofen, naproxen, or ketoprofen unless instructed by your care team. These medications may hide a fever. Be careful brushing or flossing your teeth or using a toothpick because you may get an infection or bleed more easily. If you have any dental work done, tell your dentist you are receiving this medication. This medication can make you more sensitive to cold. Do not drink cold drinks or use ice. Cover exposed skin before coming in contact with cold temperatures or cold objects. When out in cold weather wear warm clothing and cover your mouth and nose to warm the air that goes into your lungs. Tell your care team if you get sensitive to the cold. Talk to your care team if you or your partner are pregnant or think either of you might be pregnant. This medication can cause serious birth defects if taken during pregnancy and for 9 months after the last dose. A negative pregnancy test is required before starting this medication. A reliable form of contraception is recommended while taking this medication and for 9 months after the last dose. Talk to your care team about effective forms of contraception. Do not father a child while taking this medication and for 6 months after the last dose. Use a condom while having sex during this time period. Do not breastfeed while  taking this medication and for 3 months after the last dose. This medication may cause infertility. Talk to your care team if you are concerned about your fertility. What side effects may I notice from receiving this medication? Side effects that you should report to your care team as soon as possible: Allergic reactions--skin rash, itching, hives, swelling of the face, lips, tongue, or throat Bleeding--bloody or black, tar-like stools, vomiting blood or brown material that looks like coffee grounds, red or dark brown urine, small red or purple spots on skin, unusual bruising or bleeding Dry cough, shortness of breath or trouble breathing Heart rhythm changes--fast or irregular heartbeat, dizziness, feeling faint or lightheaded, chest pain, trouble breathing Infection--fever, chills, cough, sore throat, wounds that don't heal, pain or trouble when passing urine, general feeling of discomfort or being unwell Liver injury--right upper belly pain, loss of appetite, nausea, light-colored stool, dark yellow or brown urine, yellowing skin or eyes, unusual weakness or fatigue Low red blood cell level--unusual weakness or fatigue, dizziness, headache, trouble breathing Muscle injury--unusual weakness or fatigue, muscle pain, dark yellow or brown urine, decrease in amount of urine Pain, tingling, or numbness in the hands or feet Sudden and severe headache, confusion, change in vision, seizures, which may be signs of posterior reversible encephalopathy syndrome (PRES) Unusual bruising or bleeding Side effects that usually do not  require medical attention (report to your care team if they continue or are bothersome): Diarrhea Nausea Pain, redness, or swelling with sores inside the mouth or throat Unusual weakness or fatigue Vomiting This list may not describe all possible side effects. Call your doctor for medical advice about side effects. You may report side effects to FDA at 1-800-FDA-1088. Where  should I keep my medication? This medication is given in a hospital or clinic. It will not be stored at home. NOTE: This sheet is a summary. It may not cover all possible information. If you have questions about this medicine, talk to your doctor, pharmacist, or health care provider.  2024 Elsevier/Gold Standard (2023-04-27 00:00:00)  BELOW ARE SYMPTOMS THAT SHOULD BE REPORTED IMMEDIATELY: *FEVER GREATER THAN 100.4 F (38 C) OR HIGHER *CHILLS OR SWEATING *NAUSEA AND VOMITING THAT IS NOT CONTROLLED WITH YOUR NAUSEA MEDICATION *UNUSUAL SHORTNESS OF BREATH *UNUSUAL BRUISING OR BLEEDING *URINARY PROBLEMS (pain or burning when urinating, or frequent urination) *BOWEL PROBLEMS (unusual diarrhea, constipation, pain near the anus) TENDERNESS IN MOUTH AND THROAT WITH OR WITHOUT PRESENCE OF ULCERS (sore throat, sores in mouth, or a toothache) UNUSUAL RASH, SWELLING OR PAIN  UNUSUAL VAGINAL DISCHARGE OR ITCHING   Items with * indicate a potential emergency and should be followed up as soon as possible or go to the Emergency Department if any problems should occur.  Please show the CHEMOTHERAPY ALERT CARD or IMMUNOTHERAPY ALERT CARD at check-in to the Emergency Department and triage nurse.  Should you have questions after your visit or need to cancel or reschedule your appointment, please contact [facility] [place] - A DEPT OF [facility] [phone]  and follow the prompts.  Office hours are 8:00 a.m. to 4:30 p.m. Monday - Friday. Please note that voicemails left after 4:00 p.m. may not be returned until the following business day.  We are closed weekends and major holidays. You have access to a nurse at all times for urgent questions. Please call the main number to the clinic [phone] and follow the prompts.  For any non-urgent questions, you may also contact your provider using MyChart. We now offer e-Visits for anyone 47 and older to request care online for non-urgent  symptoms. For details visit [email or web address].   Also download the MyChart app! Go to the app store, search MyChart, open the app, select [facility], and log in with your MyChart username and password.

## 2024-01-03 NOTE — Progress Notes (Signed)
 Patient presents today for Oxaliplatin  infusion. Patient is in satisfactory condition with no new complaints voiced.  Vital signs are stable.  Labs reviewed by Dr. Rogers during the office visit and all labs are within treatment parameters.  We will proceed with treatment per MD orders.   Treatment given today per MD orders. Tolerated infusion without adverse affects. Vital signs stable. No complaints at this time. Discharged from clinic ambulatory in stable condition. Alert and oriented x 3. F/U with [facility] as scheduled.

## 2024-01-03 NOTE — Progress Notes (Signed)
 [facility] [address], [place] [address]   Clinic Day:  01/03/2024  Referring physician: Corrington, Kip Murphy, Murphy  Patient Care Team: Randall Murphy LABOR, Murphy as PCP - General (Family Medicine) Randall Lamar HERO, Murphy as Consulting Physician (Gastroenterology) Randall Ross Lacy Phebe, Murphy as Referring Physician (Optometry)   ASSESSMENT & PLAN:   Assessment:  1.  Stage III (T3 N1) recto-sigmoid colon adenocarcinoma: - Presentation: Rectal bleeding, seen by GI Randall Murphy - Colonoscopy (07/19/2023): Mild pancolonic diverticulosis.  Murphy single sessile 1 cm nonbleeding polyp of benign appearance found in the transverse colon.  An ulcerated bleeding 8 cm mass of malignant appearance was found in the sigmoid colon.  Mass caused partial obstruction. - CEA (07/20/2023): 39.4, (09/05/2023): 10.6 - CT CAP (07/25/2023): Mass within the sigmoid colon.  Few mildly enlarged lymph nodes adjacent to the sigmoid colon.  Tiny left upper lobe pulmonary nodule, indeterminate. - S/p robotic LAR on 08/23/2023 by Dr. Con [facility]) - Pathology: Invasive colonic adenocarcinoma, moderately differentiated arising in Murphy tubulovillous adenoma, tumor size 6.8 x 5.6 cm.  Tumor location: Distal sigmoid/rectosigmoid colon.  1/31 lymph nodes positive.  All margins negative.  LVI-not identified.  PNI-present.  pT3 pN1a.  IHC for DNA mismatch repair proteins shows retained nuclear expression of all 4 mismatch repair proteins. - Cancer next expanded +RNA insight panel [name]): Negative - I have called and talked to the pathologist.  Per the OR report and surgical pathology, tumor is at distal sigmoid/rectosigmoid area. - I have recommended 4 months of chemotherapy with CapeOx followed by chemoradiation with Xeloda . - Cycle 1 of CapeOx on 09/20/2023, total 8 cycles planned. - Dr. Con called me during the week of 10/01/2023.  He told me that the tumor location was mostly in the distal sigmoid region.  He was very  confident that he had all the lymph nodes out and recommended that we may be able to avoid radiation.  I have recommended that we will plan for total of 6 months of chemotherapy if we are avoiding radiation.  2.  Social/family history: - Lives at home with his wife Randall Murphy.  He works Paramedic job.  Non-smoker. - Maternal grandfather had brain tumor.  Paternal aunt had pancreatic cancer.   Plan:  1.  Stage III (T3 N1) recto-sigmoid colon adenocarcinoma, MMR preserved: - He completed cycle 5 of CapeOx on 12/13/2023. - No signs or symptoms of hand-foot skin reaction or mucositis. - Occasional diarrhea is well-controlled with Imodium.  He received G-CSF injection yesterday as his white count was 1.3.  He had some low back pain from it. - We reviewed labs today: White count increased to 13.9.  Platelet count is normal.  Hemoglobin is stable at 12.4.  LFTs are normal. - He may proceed with cycle 6 today. - Will follow-up on Signatera test which has not been resulted from June. - Recommend follow-up in 3 weeks.  He will receive total of 8 cycles as we are avoiding radiation therapy. - Upon completion of cycle 8, he will have imaging with CT CAP.  We discussed surveillance with CEA, LFTs every 3 months, imaging every 6 months with Signatera.  2.  Peripheral neuropathy: - Toe numbness present prior to chemotherapy is stable. - He has cold sensitivity lasting 7 days.  No worsening of the baseline numbness.  3.  Diabetes: - Ozempic  kept on hold until he finishes chemotherapy.  Blood sugar today is 243.   No orders of  the defined types were placed in this encounter.    Randall Murphy,acting as Murphy Neurosurgeon for Randall Murphy.,have documented all relevant documentation on the behalf of Randall Murphy,as directed by  Randall Murphy while in the presence of Randall Murphy.  I, Randall Stands Murphy, have reviewed the above documentation for accuracy and  completeness, and I agree with the above.     Randall Murphy   8/7/20251:21 PM  CHIEF COMPLAINT/PURPOSE OF CONSULT:   Diagnosis: sigmoid colon cancer    Cancer Staging  Rectal adenocarcinoma [facility]) Staging form: Colon and Rectum, AJCC 8th Edition - Clinical stage from 09/10/2023: Stage IIIB (cT3, cN1a, cM0) - Signed by Murphy Alean, Murphy on 09/10/2023    Prior Therapy: Resection on 08/23/23  Current Therapy: CapeOx   HISTORY OF PRESENT ILLNESS:   Oncology History  Rectal adenocarcinoma (HCC)  09/10/2023 Initial Diagnosis   Rectal adenocarcinoma (HCC)   09/10/2023 Cancer Staging   Staging form: Colon and Rectum, AJCC 8th Edition - Clinical stage from 09/10/2023: Stage IIIB (cT3, cN1a, cM0) - Signed by Murphy Alean, Murphy on 09/10/2023 Histopathologic type: Adenocarcinoma, NOS Stage prefix: Initial diagnosis Total positive nodes: 1 Total nodes examined: 31 Histologic grade (G): G2 Histologic grading system: 4 grade system   09/20/2023 -  Chemotherapy   Patient is on Treatment Plan : RECTAL Xelox (Capeox) (130/850) q21d x 6 cycles         Randall Murphy is Murphy 43 y.o. male presenting to clinic today for evaluation of sigmoid colon cancer at the request of Dr. Con.  He initially presented to his PCP on 06/19/23 with rectal bleeding. He was referred to Dr. Frutoso in GI at [facility] and underwent colonoscopy on 07/19/23. He was found to have Murphy malignant-appearing partially-obstructing mass in sigmoid colon. He underwent staging CT C/Murphy/P on 07/25/23 showing: 3 cm mass within sigmoid colon; few mildly enlarged lymph nodes adjacent to sigmoid colon, with index partially necrotic lymph node measuring 1.7 cm; tiny indeterminate LUL pulmonary nodule.  He was referred to colorectal surgery at [facility] and was taken for resection on 08/23/23 under Dr. Con. Pathology confirmed 6.8 cm invasive colonic adenocarcinoma, moderately differentiated, arising in Murphy tubulovillous adenoma. Out  of 31 biopsied lymph nodes, one was positive for metastatic disease.  Today, he states that he is doing well overall. His appetite level is at 100%. His energy level is at 50%.  INTERVAL HISTORY:   Randall Murphy is Murphy 43 y.o. male presenting to the clinic today for follow-up of sigmoid colon cancer. He was last seen by me on 12/13/2023.  Today, he states that he is doing well overall. His appetite level is at 100%. His energy level is at 100%. Randall Murphy is accompanied by his wife.   He notes G-CSF injection caused back aches the night after the injection. His neuropathy is stable. Cold sensitivities continue to last 1 week after treatment. Randall Murphy states he had hiccups the day after treatment. Diarrhea is well controlled with imodium. He denies any mouth sores, or redness and blistering on the palms and soles.   Randall Murphy has had difficulties receiving his capecitabine  medication, though he will receive it either today or tomorrow.   He will have Murphy sigmoidoscopy scheduled with the next month and will have annual colonoscopies. Randall Murphy's wife states he will receive sigmoidoscopes every 6 months for the next 1-2 years.  PAST MEDICAL HISTORY:   Past Medical History: Past Medical History:  Diagnosis Date   ADD (attention  deficit disorder) 02/22/2015   Anxiety, generalized 02/15/2016   Diabetes mellitus without complication (HCC)    Dyslipidemia 02/22/2015   Elevated liver enzymes    Fatty liver 03/23/2015   Hypertension     Surgical History: Past Surgical History:  Procedure Laterality Date   IR IMAGING GUIDED PORT INSERTION  09/07/2023    Social History: Social History   Socioeconomic History   Marital status: Married    Spouse name: Not on file   Number of children: 2   Years of education: Not on file   Highest education level: Not on file  Occupational History   Occupation: IT  Tobacco Use   Smoking status: Never   Smokeless tobacco: Never  Vaping Use   Vaping status: Never Used   Substance and Sexual Activity   Alcohol use: Yes    Alcohol/week: 4.0 standard drinks of alcohol    Types: 4 Cans of beer per week    Comment: daily   Drug use: Never   Sexual activity: Yes    Birth control/protection: Condom  Other Topics Concern   Not on file  Social History Narrative   Not on file   Social Drivers of Murphy   Financial Resource Strain: Low Risk  (07/20/2023)   Received from [facility]   Overall Financial Resource Strain (CARDIA)    Difficulty of Paying Living Expenses: Not very hard  Food Insecurity: No Food Insecurity (08/23/2023)   Received from [facility]   Hunger Vital Sign    Within the past 12 months, you worried that your food would run out before you got the money to buy more.: Never true    Within the past 12 months, the food you bought just didn't last and you didn't have money to get more.: Never true  Transportation Needs: No Transportation Needs (08/23/2023)   Received from [facility] - Transportation    Lack of Transportation (Medical): No    Lack of Transportation (Non-Medical): No  Physical Activity: Insufficiently Active (07/20/2023)   Received from [facility]   Exercise Vital Sign    On average, how many days per week do you engage in moderate to strenuous exercise (like Murphy brisk walk)?: 2 days    On average, how many minutes do you engage in exercise at this level?: 20 min  Stress: No Stress Concern Present (08/23/2023)   Received from [facility] of [facility] - Occupational Stress Questionnaire    Feeling of Stress : Not at all  Social Connections: Moderately Integrated (07/20/2023)   Received from [facility]   Social Network    How would you rate your social network (family, work, friends)?: Adequate participation with social networks  Intimate Partner Violence: Not At Risk (08/23/2023)   Received from [facility]   HITS    Over the last 12 months how often did your partner  physically hurt you?: Never    Over the last 12 months how often did your partner insult you or talk down to you?: Never    Over the last 12 months how often did your partner threaten you with physical harm?: Never    Over the last 12 months how often did your partner scream or curse at you?: Never    Family History: Family History  Problem Relation Age of Onset   Arthritis Mother    Diabetes Father    Hypertension Father    Stroke Father    Kidney disease  Father        ESRD on dialysis   Diabetes Sister    Hypertension Brother    Diabetes Maternal Grandmother    Brain cancer Maternal Grandfather    Heart attack Paternal Grandfather     Current Medications:  Current Outpatient Medications:    baclofen  (LIORESAL ) 10 MG tablet, Take 1 tablet (10 mg total) by mouth 3 (three) times daily as needed for muscle spasms. 1 tablet 3 times daily as needed for hiccups, Disp: 30 each, Rfl: 0   buPROPion (WELLBUTRIN XL) 150 MG 24 hr tablet, Take 150 mg by mouth., Disp: , Rfl:    capecitabine  (XELODA ) 500 MG tablet, Take 4 tablets (2,000 mg total) by mouth 2 (two) times daily after Murphy meal. Take 2 weeks on/1 week off every 3 weeks x 6, Disp: 112 tablet, Rfl: 5   Continuous Blood Gluc Sensor (FREESTYLE LIBRE 3 SENSOR) MISC, 1 Device by Does not apply route every 14 (fourteen) days. Place 1 sensor on the skin every 14 days. Use to check glucose continuously, Disp: 2 each, Rfl: 2   escitalopram  (LEXAPRO ) 10 MG tablet, Take 1 tablet (10 mg total) by mouth daily. (NEEDS TO BE SEEN BEFORE NEXT REFILL), Disp: 30 tablet, Rfl: 0   lidocaine -prilocaine  (EMLA ) cream, Apply to affected area once, Disp: 30 g, Rfl: 3   lisinopril  (ZESTRIL ) 10 MG tablet, Take 1 tablet (10 mg total) by mouth daily., Disp: 90 tablet, Rfl: 3   oxyCODONE (OXY IR/ROXICODONE) 5 MG immediate release tablet, Take 5 mg by mouth every 4 (four) hours as needed., Disp: , Rfl:    pravastatin  (PRAVACHOL ) 40 MG tablet, Take 1 tablet (40 mg  total) by mouth daily. (NEEDS TO BE SEEN BEFORE NEXT REFILL), Disp: 30 tablet, Rfl: 0   prochlorperazine  (COMPAZINE ) 10 MG tablet, Take 1 tablet (10 mg total) by mouth every 6 (six) hours as needed for nausea or vomiting., Disp: 60 tablet, Rfl: 3   Semaglutide , 2 MG/DOSE, (OZEMPIC , 2 MG/DOSE,) 8 MG/3ML SOPN, Inject 2 mg into the skin once Murphy week. (NEEDS TO BE SEEN BEFORE NEXT REFILL), Disp: 3 mL, Rfl: 0   VYVANSE  40 MG capsule, Take by mouth., Disp: , Rfl:  No current facility-administered medications for this visit.  Facility-Administered Medications Ordered in Other Visits:    dextrose  5 % solution, , Intravenous, Continuous, Rogers Hai, Murphy, Last Rate: 10 mL/hr at 01/03/24 1107, New Bag at 01/03/24 1107   oxaliplatin  (ELOXATIN ) 250 mg in dextrose  5 % 500 mL chemo infusion, 130 mg/m2 (Treatment Plan Recorded), Intravenous, Once, Rogers Hai, Murphy, Last Rate: 275 mL/hr at 01/03/24 1222, 250 mg at 01/03/24 1222   Allergies: No Known Allergies  REVIEW OF SYSTEMS:   Review of Systems  Constitutional:  Negative for chills, fatigue and fever.  HENT:   Negative for lump/mass, mouth sores, nosebleeds, sore throat and trouble swallowing.   Eyes:  Negative for eye problems.  Respiratory:  Negative for cough and shortness of breath.   Cardiovascular:  Negative for chest pain, leg swelling and palpitations.  Gastrointestinal:  Negative for abdominal pain, constipation, diarrhea, nausea and vomiting.  Genitourinary:  Negative for bladder incontinence, difficulty urinating, dysuria, frequency, hematuria and nocturia.   Musculoskeletal:  Negative for arthralgias, back pain, flank pain, myalgias and neck pain.  Skin:  Negative for itching and rash.  Neurological:  Positive for numbness (in hands and feet). Negative for dizziness and headaches.  Hematological:  Does not bruise/bleed easily.  Psychiatric/Behavioral:  Negative for  depression, sleep disturbance and suicidal ideas. The  patient is not nervous/anxious.   All other systems reviewed and are negative.    VITALS:   Blood pressure 107/77, pulse 90, temperature 98 F (36.7 C), temperature source Oral, resp. rate 18, weight 188 lb 1.6 oz (85.3 kg), SpO2 100%.  Wt Readings from Last 3 Encounters:  01/03/24 188 lb 1.6 oz (85.3 kg)  01/02/24 190 lb 11.2 oz (86.5 kg)  12/13/23 189 lb 14.4 oz (86.1 kg)    Body mass index is 27.78 kg/m.  Performance status (ECOG): 0 - Asymptomatic  PHYSICAL EXAM:   Physical Exam Vitals and nursing note reviewed. Exam conducted with Murphy chaperone present.  Constitutional:      Appearance: Normal appearance.  Cardiovascular:     Rate and Rhythm: Normal rate and regular rhythm.     Pulses: Normal pulses.     Heart sounds: Normal heart sounds.  Pulmonary:     Effort: Pulmonary effort is normal.     Breath sounds: Normal breath sounds.  Abdominal:     Palpations: Abdomen is soft. There is no hepatomegaly, splenomegaly or mass.     Tenderness: There is no abdominal tenderness.  Musculoskeletal:     Right lower leg: No edema.     Left lower leg: No edema.  Lymphadenopathy:     Cervical: No cervical adenopathy.     Right cervical: No superficial, deep or posterior cervical adenopathy.    Left cervical: No superficial, deep or posterior cervical adenopathy.     Upper Body:     Right upper body: No supraclavicular or axillary adenopathy.     Left upper body: No supraclavicular or axillary adenopathy.  Neurological:     General: No focal deficit present.     Mental Status: He is alert and oriented to person, place, and time.  Psychiatric:        Mood and Affect: Mood normal.        Behavior: Behavior normal.   Laparoscopic surgical sites are healing well.  LABS:   CBC    Component Value Date/Time   WBC 13.9 (H) 01/03/2024 1002   RBC 3.59 (L) 01/03/2024 1002   HGB 12.4 (L) 01/03/2024 1002   HGB 14.2 05/18/2022 0938   HCT 36.2 (L) 01/03/2024 1002   HCT 42.4  05/18/2022 0938   PLT 157 01/03/2024 1002   PLT 298 05/18/2022 0938   MCV 100.8 (H) 01/03/2024 1002   MCV 81 05/18/2022 0938   MCH 34.5 (H) 01/03/2024 1002   MCHC 34.3 01/03/2024 1002   RDW 18.7 (H) 01/03/2024 1002   RDW 12.3 05/18/2022 0938   LYMPHSABS 1.4 01/03/2024 1002   LYMPHSABS 1.8 05/18/2022 0938   MONOABS 0.9 01/03/2024 1002   EOSABS 0.2 01/03/2024 1002   EOSABS 0.3 05/18/2022 0938   BASOSABS 0.1 01/03/2024 1002   BASOSABS 0.1 05/18/2022 0938    CMP    Component Value Date/Time   NA 135 01/02/2024 1020   NA 137 05/18/2022 0848   K 3.8 01/02/2024 1020   CL 100 01/02/2024 1020   CO2 22 01/02/2024 1020   GLUCOSE 243 (H) 01/02/2024 1020   BUN 8 01/02/2024 1020   BUN 6 05/18/2022 0848   CREATININE 0.89 01/02/2024 1020   CALCIUM  9.7 01/02/2024 1020   PROT 7.2 01/02/2024 1020   PROT 7.6 05/18/2022 0848   ALBUMIN 3.9 01/02/2024 1020   ALBUMIN 4.7 05/18/2022 0848   AST 40 01/02/2024 1020   ALT 29 01/02/2024 1020  ALKPHOS 70 01/02/2024 1020   BILITOT 1.1 01/02/2024 1020   BILITOT 0.8 05/18/2022 0848   GFRNONAA >60 01/02/2024 1020   GFRAA 102 01/22/2020 0924     Lab Results  Component Value Date   CEA1 4.5 10/10/2023   /  CEA  Date Value Ref Range Status  10/10/2023 4.5 0.0 - 4.7 ng/mL Final    Comment:    (NOTE)                             Nonsmokers          <3.9                             Smokers             <5.6 Roche [employer] Electrochemiluminescence Immunoassay (ECLIA) Values obtained with different assay methods or kits cannot be used interchangeably.  Results cannot be interpreted as absolute evidence of the presence or absence of malignant disease. Performed At: [facility] [address] [place], [place] [id number] Jennette Shorter Murphy Ey:[phone]    No results found for: PSA1 No results found for: CAN199 No results found for: CAN125  No results found for: TOTALPROTELP, ALBUMINELP, A1GS, A2GS, BETS, BETA2SER,  GAMS, MSPIKE, SPEI Lab Results  Component Value Date   TIBC 463 (H) 09/05/2023   FERRITIN 299 09/05/2023   IRONPCTSAT 29 09/05/2023   No results found for: LDH   STUDIES:   No results found.

## 2024-01-03 NOTE — Patient Instructions (Addendum)
 [facility]  Discharge Instructions  You were seen and examined today by Dr. Rogers.  Dr. Rogers discussed your most recent lab work which revealed that everything looks good and stable.   Start taking the Xeloda  tomorrow.  Dr. Rogers will repeat CT scan after you finish the chemotherapy.  Follow-up as scheduled.    Thank you for choosing [facility] to provide your oncology and hematology care.   To afford each patient quality time with our provider, please arrive at least 15 minutes before your scheduled appointment time. You may need to reschedule your appointment if you arrive late (10 or more minutes). Arriving late affects you and other patients whose appointments are after yours.  Also, if you miss three or more appointments without notifying the office, you may be dismissed from the clinic at the provider's discretion.    Again, thank you for choosing [facility].  Our hope is that these requests will decrease the amount of time that you wait before being seen by our physicians.   If you have a lab appointment with the Cancer Center - please note that after April 8th, all labs will be drawn in the cancer center.  You do not have to check in or register with the main entrance as you have in the past but will complete your check-in at the cancer center.            _____________________________________________________________  Should you have questions after your visit to [facility], please contact our office at [phone] and follow the prompts.  Our office hours are 8:00 a.m. to 4:30 p.m. Monday - Thursday and 8:00 a.m. to 2:30 p.m. Friday.  Please note that voicemails left after 4:00 p.m. may not be returned until the following business day.  We are closed weekends and all major holidays.  You do have access to a nurse 24-7, just call the main number to the clinic [phone] and do not  press any options, hold on the line and a nurse will answer the phone.    For prescription refill requests, have your pharmacy contact our office and allow 72 hours.    Masks are no longer required in the cancer centers. If you would like for your care team to wear a mask while they are taking care of you, please let them know. You may have one support person who is at least 45 years old accompany you for your appointments.

## 2024-01-03 NOTE — Progress Notes (Signed)
 Tolerating Xeloda  without complications

## 2024-01-04 ENCOUNTER — Encounter: Payer: Self-pay | Admitting: Hematology

## 2024-01-04 ENCOUNTER — Other Ambulatory Visit: Payer: Self-pay

## 2024-01-17 DIAGNOSIS — G629 Polyneuropathy, unspecified: Secondary | ICD-10-CM | POA: Insufficient documentation

## 2024-01-17 NOTE — Assessment & Plan Note (Addendum)
 Stage III rectosigmoid colon adenocarcinoma, MMR preserved Oncology history as below As per previous documentation, previous oncologist spoke to the surgeon and confirmed that the tumor location was mostly in the distal sigmoid region.  Also confirmed that all the lymph nodes were taken out and recommended to avoid radiation Total 8 cycles of adjuvant CapeOx planned  - Day 1 cycle 7 next week.  Tolerated previous cycles well. - Continue current dose of capecitabine  at 2000 mg twice daily to be taken 14 days on/7 days off.  Patient had previous prescription started late.  Will finish in 2 days. -Upon completion of 8 cycles.  Will do surveillance per NCCN guidelines - Will obtain CEA with next cycle.  Will bring him for labs on Wednesday. -11/21/2023: Signatera: Negative.  Will repeat in 6 months. Due 04/2024  Return to clinic prior to cycle 8 of chemotherapy

## 2024-01-17 NOTE — Assessment & Plan Note (Addendum)
 Toe numbness present prior to chemotherapy is stable.  He has cold sensitivity lasting 7 days.  No worsening of the baseline numbness.  - Continue to monitor for now

## 2024-01-17 NOTE — Progress Notes (Addendum)
 Patient Care Team: Corrington, Kip A, MD as PCP - General (Family Medicine) Rourk, Lamar HERO, MD as Consulting Physician (Gastroenterology) Ladora Ross Lacy Phebe, MD as Referring Physician (Optometry)  Clinic Day:  01/19/2024  Referring physician: Bobbette Coye LABOR, MD   CHIEF COMPLAINT:  CC: Stage III (T3 N1) recto-sigmoid colon adenocarcinoma, MMR preserved   Randall GORMAN Blush 43 y.o. male was transferred to my care after his prior physician has left.   ASSESSMENT & PLAN:   Assessment & Plan: Randall Murphy  is a 43 y.o. male with stage III (T3 N1) recto-sigmoid colon adenocarcinoma, MMR preserved  Assessment & Plan Rectal adenocarcinoma (HCC) Stage III rectosigmoid colon adenocarcinoma, MMR preserved Oncology history as below As per previous documentation, previous oncologist spoke to the surgeon and confirmed that the tumor location was mostly in the distal sigmoid region.  Also confirmed that all the lymph nodes were taken out and recommended to avoid radiation Total 8 cycles of adjuvant CapeOx planned  - Day 1 cycle 7 next week.  Tolerated previous cycles well. - Continue current dose of capecitabine  at 2000 mg twice daily to be taken 14 days on/7 days off.  Patient had previous prescription started late.  Will finish in 2 days. -Upon completion of 8 cycles.  Will do surveillance per NCCN guidelines - Will obtain CEA with next cycle.  Will bring him for labs on Wednesday. -11/21/2023: Signatera: Negative.  Will repeat in 6 months. Due 04/2024  Return to clinic prior to cycle 8 of chemotherapy Other polyneuropathy Toe numbness present prior to chemotherapy is stable.  He has cold sensitivity lasting 7 days.  No worsening of the baseline numbness.  - Continue to monitor for now Pancytopenia [facility]) Likely secondary to myelosuppression from chemotherapy Anemia panel from 08/2023 was within normal limits  - Will continue to monitor for now    The patient understands the plans  discussed today and is in agreement with them.  He knows to contact our office if he develops concerns prior to his next appointment.  50 minutes of total time was spent for this patient encounter, including preparation, face-to-face counseling with the patient and coordination of care, physical exam, and documentation of the encounter. > 50% of the time was spent on counseling as documented under my assessment and plan.    Randall Murphy,acting as a Neurosurgeon for [name], MD.,have documented all relevant documentation on the behalf of [name], MD,as directed by  [name], MD while in the presence of [name], MD.  I, [name] MD, have reviewed the above documentation for accuracy and completeness, and I agree with the above.     [name], MD  [facility]  - A DEPT OF [facility] [address] [place] [address] Dept: [phone] Dept Fax: [phone]   Orders Placed This Encounter  Procedures   CEA    Standing Status:   Future    Expected Date:   01/23/2024    Expiration Date:   04/22/2024     ONCOLOGY HISTORY:   I have reviewed his chart and materials related to his cancer extensively and collaborated history with the patient. Summary of oncologic history is as follows:   Diagnosis: Stage III (T3 N1) recto-sigmoid colon adenocarcinoma, MMR preserved   -Presentation: Rectal bleeding -07/19/2023: Colonoscopy found: Mild pancolonic diverticulosis. A single sessile 1 cm nonbleeding polyp of benign appearance found in the transverse colon. An  ulcerated bleeding 8 cm mass of malignant appearance was found in the sigmoid colon. Mass caused partial obstruction.   -07/20/2023: CEA: 39.4.  -07/25/2023: CT CAP: Mass within the sigmoid colon measures approximately 3.0 x 2.1 cm suspicious for malignancy. There are a few mildly enlarged lymph nodes adjacent to the sigmoid colon.  Index partially necrotic lymph node measures 1.7 x 1.2 cm. Tiny left upper lobe pulmonary nodule  -08/23/2023: Robotic L[facility] at [facility] Pathology of sigmoid colon resection: Invasive colonic adenocarcinoma, moderately differentiated, arising in a tubulovillous adenoma. Tumor size 6.8 x 5.6 cm. Tumor location: Distal sigmoid/rectosigmoid colon. 1/31 lymph nodes positive for malignancy. All margins negative. Perineural invasion present. No lymphovascular invasion identified. MMR preserved. Pathology of distal and proximal donut excisions were negative for malignancy.  -Cancer next expanded +RNA insight panel [name]): Negative -09/05/2023: CEA: 10.6 -09/07/2023: IR guided port placement -09/20/2023 to current: CapeOx -10/10/2023: CEA: 4.5 -11/21/2023: Signatera: Negative  Current Treatment: Adjuvant capecitabine + oxaliplatin   INTERVAL HISTORY:   Randall Murphy is here today for follow up. Patient is accompanied by his wife today.  Patient reports no complaints.  Denies fever, chills, abdominal pain, nausea, vomiting, diarrhea.  Reports some peripheral neuropathy in his feet but is stable.  Overall, he is doing well.  He reported that his previous capecitabine  prescription came late and he started late and will finish tomorrow.  Recommended to start capecitabine  the same day as oxaliplatin .   I have reviewed the past medical history, past surgical history, social history and family history with the patient and they are unchanged from previous note.  ALLERGIES:  has no known allergies.  MEDICATIONS:  Current Outpatient Medications  Medication Sig Dispense Refill   baclofen  (LIORESAL ) 10 MG tablet Take 1 tablet (10 mg total) by mouth 3 (three) times daily as needed for muscle spasms. 1 tablet 3 times daily as needed for hiccups 30 each 0   buPROPion (WELLBUTRIN XL) 150 MG 24 hr tablet Take 150 mg by mouth.     capecitabine  (XELODA ) 500 MG tablet TAKE 4 TABLETS BY MOUTH 2 TIMES A DAY AFTER  A MEAL FOR 14 DAYS ON, THEN 7 DAYS OFF. 112 tablet 5   Continuous Blood Gluc Sensor (FREESTYLE LIBRE 3 SENSOR) MISC 1 Device by Does not apply route every 14 (fourteen) days. Place 1 sensor on the skin every 14 days. Use to check glucose continuously 2 each 2   escitalopram  (LEXAPRO ) 10 MG tablet Take 1 tablet (10 mg total) by mouth daily. (NEEDS TO BE SEEN BEFORE NEXT REFILL) 30 tablet 0   lidocaine -prilocaine  (EMLA ) cream Apply to affected area once 30 g 3   lisinopril  (ZESTRIL ) 10 MG tablet Take 1 tablet (10 mg total) by mouth daily. 90 tablet 3   oxyCODONE (OXY IR/ROXICODONE) 5 MG immediate release tablet Take 5 mg by mouth every 4 (four) hours as needed.     pravastatin  (PRAVACHOL ) 40 MG tablet Take 1 tablet (40 mg total) by mouth daily. (NEEDS TO BE SEEN BEFORE NEXT REFILL) 30 tablet 0   prochlorperazine  (COMPAZINE ) 10 MG tablet Take 1 tablet (10 mg total) by mouth every 6 (six) hours as needed for nausea or vomiting. 60 tablet 3   Semaglutide , 2 MG/DOSE, (OZEMPIC , 2 MG/DOSE,) 8 MG/3ML SOPN Inject 2 mg into the skin once a week. (NEEDS TO BE SEEN BEFORE NEXT REFILL) 3 mL 0   VYVANSE  40 MG capsule Take by mouth.     No current facility-administered medications for this visit.  REVIEW OF SYSTEMS:   Constitutional: Denies fevers, chills or abnormal weight loss Eyes: Denies blurriness of vision Ears, nose, mouth, throat, and face: Denies mucositis or sore throat Respiratory: Denies cough, dyspnea or wheezes Cardiovascular: Denies palpitation, chest discomfort or lower extremity swelling Gastrointestinal:  Denies nausea, heartburn or change in bowel habits Skin: Denies abnormal skin rashes Lymphatics: Denies new lymphadenopathy or easy bruising Neurological:Denies numbness, tingling or new weaknesses Behavioral/Psych: Mood is stable, no new changes  All other systems were reviewed with the patient and are negative.   VITALS:  Blood pressure 116/81, pulse 89, temperature 98.1 F (36.7  C), temperature source Oral, resp. rate 18, weight 190 lb 11.2 oz (86.5 kg), SpO2 100%.  Wt Readings from Last 3 Encounters:  01/18/24 190 lb 11.2 oz (86.5 kg)  01/03/24 188 lb 1.6 oz (85.3 kg)  01/02/24 190 lb 11.2 oz (86.5 kg)    Body mass index is 28.16 kg/m.  Performance status (ECOG): 0 - Asymptomatic  PHYSICAL EXAM:   GENERAL:alert, no distress and comfortable SKIN: skin color, texture, turgor are normal, no rashes or significant lesions LUNGS: clear to auscultation and percussion with normal breathing effort HEART: regular rate & rhythm and no murmurs and no lower extremity edema ABDOMEN:abdomen soft, non-tender and normal bowel sounds Musculoskeletal:no cyanosis of digits and no clubbing  NEURO: alert & oriented x 3 with fluent speech, no focal motor/sensory deficits  LABORATORY DATA:  I have reviewed the data as listed    Component Value Date/Time   NA 135 01/18/2024 0813   NA 137 05/18/2022 0848   K 3.8 01/18/2024 0813   CL 102 01/18/2024 0813   CO2 23 01/18/2024 0813   GLUCOSE 246 (H) 01/18/2024 0813   BUN 7 01/18/2024 0813   BUN 6 05/18/2022 0848   CREATININE 0.80 01/18/2024 0813   CALCIUM  9.2 01/18/2024 0813   PROT 6.9 01/18/2024 0813   PROT 7.6 05/18/2022 0848   ALBUMIN 3.8 01/18/2024 0813   ALBUMIN 4.7 05/18/2022 0848   AST 39 01/18/2024 0813   ALT 28 01/18/2024 0813   ALKPHOS 71 01/18/2024 0813   BILITOT 1.0 01/18/2024 0813   BILITOT 0.8 05/18/2022 0848   GFRNONAA >60 01/18/2024 0813   GFRAA 102 01/22/2020 0924    Lab Results  Component Value Date   WBC 2.7 (L) 01/18/2024   NEUTROABS 0.9 (L) 01/18/2024   HGB 11.8 (L) 01/18/2024   HCT 33.1 (L) 01/18/2024   MCV 99.4 01/18/2024   PLT 101 (L) 01/18/2024     Chemistry      Component Value Date/Time   NA 135 01/18/2024 0813   NA 137 05/18/2022 0848   K 3.8 01/18/2024 0813   CL 102 01/18/2024 0813   CO2 23 01/18/2024 0813   BUN 7 01/18/2024 0813   BUN 6 05/18/2022 0848   CREATININE 0.80  01/18/2024 0813      Component Value Date/Time   CALCIUM  9.2 01/18/2024 0813   ALKPHOS 71 01/18/2024 0813   AST 39 01/18/2024 0813   ALT 28 01/18/2024 0813   BILITOT 1.0 01/18/2024 0813   BILITOT 0.8 05/18/2022 0848      Latest Reference Range & Units 09/05/23 09:21  Iron 45 - 182 ug/dL 866  UIBC ug/dL 669  TIBC 749 - 549 ug/dL 536 (H)  Saturation Ratios 17.9 - 39.5 % 29  Ferritin 24 - 336 ng/mL 299  (H): Data is abnormally high  RADIOGRAPHIC STUDIES: I have personally reviewed the radiological images as listed and  agreed with the findings in the report.  None new to review

## 2024-01-18 ENCOUNTER — Inpatient Hospital Stay (HOSPITAL_BASED_OUTPATIENT_CLINIC_OR_DEPARTMENT_OTHER): Admitting: Oncology

## 2024-01-18 ENCOUNTER — Inpatient Hospital Stay

## 2024-01-18 VITALS — BP 116/81 | HR 89 | Temp 98.1°F | Resp 18 | Wt 190.7 lb

## 2024-01-18 DIAGNOSIS — G6289 Other specified polyneuropathies: Secondary | ICD-10-CM

## 2024-01-18 DIAGNOSIS — C20 Malignant neoplasm of rectum: Secondary | ICD-10-CM | POA: Diagnosis not present

## 2024-01-18 DIAGNOSIS — D61818 Other pancytopenia: Secondary | ICD-10-CM | POA: Diagnosis not present

## 2024-01-18 DIAGNOSIS — Z5111 Encounter for antineoplastic chemotherapy: Secondary | ICD-10-CM | POA: Diagnosis not present

## 2024-01-18 LAB — COMPREHENSIVE METABOLIC PANEL WITH GFR
ALT: 28 U/L (ref 0–44)
AST: 39 U/L (ref 15–41)
Albumin: 3.8 g/dL (ref 3.5–5.0)
Alkaline Phosphatase: 71 U/L (ref 38–126)
Anion gap: 10 (ref 5–15)
BUN: 7 mg/dL (ref 6–20)
CO2: 23 mmol/L (ref 22–32)
Calcium: 9.2 mg/dL (ref 8.9–10.3)
Chloride: 102 mmol/L (ref 98–111)
Creatinine, Ser: 0.8 mg/dL (ref 0.61–1.24)
GFR, Estimated: >60 mL/min (ref >60)
Glucose, Bld: 246 mg/dL — ABNORMAL HIGH (ref 70–99)
Potassium: 3.8 mmol/L (ref 3.5–5.1)
Sodium: 135 mmol/L (ref 135–145)
Total Bilirubin: 1 mg/dL (ref 0.0–1.2)
Total Protein: 6.9 g/dL (ref 6.5–8.1)

## 2024-01-18 LAB — CBC WITH DIFFERENTIAL/PLATELET
Basophils Absolute: 0.1 K/uL (ref 0.0–0.1)
Basophils Relative: 3 %
Eosinophils Absolute: 0.1 K/uL (ref 0.0–0.5)
Eosinophils Relative: 4 %
HCT: 33.1 % — ABNORMAL LOW (ref 39.0–52.0)
Hemoglobin: 11.8 g/dL — ABNORMAL LOW (ref 13.0–17.0)
Lymphocytes Relative: 42 %
Lymphs Abs: 1.1 K/uL (ref 0.7–4.0)
MCH: 35.4 pg — ABNORMAL HIGH (ref 26.0–34.0)
MCHC: 35.6 g/dL (ref 30.0–36.0)
MCV: 99.4 fL (ref 80.0–100.0)
Monocytes Absolute: 0.5 K/uL (ref 0.1–1.0)
Monocytes Relative: 18 %
Neutro Abs: 0.9 K/uL — ABNORMAL LOW (ref 1.7–7.7)
Neutrophils Relative %: 33 %
Platelets: 101 K/uL — ABNORMAL LOW (ref 150–400)
RBC: 3.33 MIL/uL — ABNORMAL LOW (ref 4.22–5.81)
RDW: 16.3 % — ABNORMAL HIGH (ref 11.5–15.5)
Smear Review: NORMAL
WBC: 2.7 K/uL — ABNORMAL LOW (ref 4.0–10.5)
nRBC: 0 % (ref 0.0–0.2)

## 2024-01-18 LAB — MAGNESIUM: Magnesium: 1.6 mg/dL — ABNORMAL LOW (ref 1.7–2.4)

## 2024-01-18 NOTE — Progress Notes (Signed)
 [name] presented for Portacath access and flush with labs for treatment next week. Portacath located right chest wall accessed with  H 20 needle.  Good blood return present. Portacath flushed with 20ml NS and needle removed intact. No bruising or swelling noted at the site. Procedure tolerated well and without incident.     Discharged from clinic ambulatory in stable condition. Alert and oriented x 3. F/U with [facility] as scheduled.

## 2024-01-19 ENCOUNTER — Other Ambulatory Visit: Payer: Self-pay | Admitting: Oncology

## 2024-01-19 ENCOUNTER — Encounter: Payer: Self-pay | Admitting: Oncology

## 2024-01-19 DIAGNOSIS — C20 Malignant neoplasm of rectum: Secondary | ICD-10-CM

## 2024-01-19 DIAGNOSIS — D61818 Other pancytopenia: Secondary | ICD-10-CM | POA: Insufficient documentation

## 2024-01-19 NOTE — Assessment & Plan Note (Addendum)
 Likely secondary to myelosuppression from chemotherapy Anemia panel from 08/2023 was within normal limits  - Will continue to monitor for now

## 2024-01-20 ENCOUNTER — Other Ambulatory Visit: Payer: Self-pay

## 2024-01-23 ENCOUNTER — Inpatient Hospital Stay

## 2024-01-23 ENCOUNTER — Other Ambulatory Visit

## 2024-01-23 DIAGNOSIS — C20 Malignant neoplasm of rectum: Secondary | ICD-10-CM

## 2024-01-23 DIAGNOSIS — Z5111 Encounter for antineoplastic chemotherapy: Secondary | ICD-10-CM | POA: Diagnosis not present

## 2024-01-23 LAB — COMPREHENSIVE METABOLIC PANEL WITH GFR
ALT: 28 U/L (ref 0–44)
AST: 38 U/L (ref 15–41)
Albumin: 4 g/dL (ref 3.5–5.0)
Alkaline Phosphatase: 73 U/L (ref 38–126)
Anion gap: 13 (ref 5–15)
BUN: 9 mg/dL (ref 6–20)
CO2: 22 mmol/L (ref 22–32)
Calcium: 9.8 mg/dL (ref 8.9–10.3)
Chloride: 102 mmol/L (ref 98–111)
Creatinine, Ser: 0.75 mg/dL (ref 0.61–1.24)
GFR, Estimated: >60 mL/min (ref >60)
Glucose, Bld: 242 mg/dL — ABNORMAL HIGH (ref 70–99)
Potassium: 4.2 mmol/L (ref 3.5–5.1)
Sodium: 137 mmol/L (ref 135–145)
Total Bilirubin: 1.1 mg/dL (ref 0.0–1.2)
Total Protein: 7.5 g/dL (ref 6.5–8.1)

## 2024-01-23 LAB — CBC WITH DIFFERENTIAL/PLATELET
Abs Immature Granulocytes: 0.01 K/uL (ref 0.00–0.07)
Basophils Absolute: 0 K/uL (ref 0.0–0.1)
Basophils Relative: 1 %
Eosinophils Absolute: 0.1 K/uL (ref 0.0–0.5)
Eosinophils Relative: 4 %
HCT: 34.7 % — ABNORMAL LOW (ref 39.0–52.0)
Hemoglobin: 12.3 g/dL — ABNORMAL LOW (ref 13.0–17.0)
Immature Granulocytes: 0 %
Lymphocytes Relative: 41 %
Lymphs Abs: 1.3 K/uL (ref 0.7–4.0)
MCH: 35.4 pg — ABNORMAL HIGH (ref 26.0–34.0)
MCHC: 35.4 g/dL (ref 30.0–36.0)
MCV: 100 fL (ref 80.0–100.0)
Monocytes Absolute: 0.6 K/uL (ref 0.1–1.0)
Monocytes Relative: 18 %
Neutro Abs: 1.2 K/uL — ABNORMAL LOW (ref 1.7–7.7)
Neutrophils Relative %: 36 %
Platelets: 158 K/uL (ref 150–400)
RBC: 3.47 MIL/uL — ABNORMAL LOW (ref 4.22–5.81)
RDW: 16.9 % — ABNORMAL HIGH (ref 11.5–15.5)
WBC: 3.2 K/uL — ABNORMAL LOW (ref 4.0–10.5)
nRBC: 0 % (ref 0.0–0.2)

## 2024-01-23 LAB — MAGNESIUM: Magnesium: 1.7 mg/dL (ref 1.7–2.4)

## 2024-01-23 NOTE — Progress Notes (Signed)
 Patients port flushed without difficulty.  Good blood return noted with no bruising or swelling noted at site. Labs drawn per orders. VSS with discharge and left in satisfactory condition with no s/s of distress noted. All follow ups as scheduled. All follow ups as scheduled.    ANC 1.2- per Dr Davonna patient does not need Zarxio  shot today and proceed with treatment tomorrow 01/24/24. Pt updated and agrees to plan.   Rokia Bosket

## 2024-01-24 ENCOUNTER — Encounter: Payer: Self-pay | Admitting: Oncology

## 2024-01-24 ENCOUNTER — Inpatient Hospital Stay

## 2024-01-24 VITALS — BP 148/90 | HR 99 | Temp 97.3°F | Resp 19

## 2024-01-24 DIAGNOSIS — Z5111 Encounter for antineoplastic chemotherapy: Secondary | ICD-10-CM | POA: Diagnosis not present

## 2024-01-24 DIAGNOSIS — C20 Malignant neoplasm of rectum: Secondary | ICD-10-CM

## 2024-01-24 LAB — CEA: CEA: 6.3 ng/mL — ABNORMAL HIGH (ref 0.0–4.7)

## 2024-01-24 MED ORDER — DEXTROSE 5 % IV SOLN: INTRAVENOUS | Status: DC

## 2024-01-24 MED ORDER — DEXAMETHASONE SODIUM PHOSPHATE 10 MG/ML IJ SOLN
10.0000 mg | Freq: Once | INTRAMUSCULAR | Status: AC
  Administered 2024-01-24: 10 mg via INTRAVENOUS
  Filled 2024-01-24: qty 1

## 2024-01-24 MED ORDER — SODIUM CHLORIDE 0.9 % IV SOLN
150.0000 mg | Freq: Once | INTRAVENOUS | Status: AC
  Administered 2024-01-24: 150 mg via INTRAVENOUS
  Filled 2024-01-24: qty 150

## 2024-01-24 MED ORDER — PALONOSETRON HCL INJECTION 0.25 MG/5ML
0.2500 mg | Freq: Once | INTRAVENOUS | Status: AC
  Administered 2024-01-24: 0.25 mg via INTRAVENOUS
  Filled 2024-01-24: qty 5

## 2024-01-24 MED ORDER — OXALIPLATIN CHEMO INJECTION 100 MG/20ML
130.0000 mg/m2 | Freq: Once | INTRAVENOUS | Status: AC
  Administered 2024-01-24: 250 mg via INTRAVENOUS
  Filled 2024-01-24: qty 40

## 2024-01-24 MED ORDER — CETIRIZINE HCL 10 MG/ML IV SOLN
10.0000 mg | Freq: Once | INTRAVENOUS | Status: AC
  Administered 2024-01-24: 10 mg via INTRAVENOUS
  Filled 2024-01-24: qty 1

## 2024-01-24 NOTE — Progress Notes (Signed)
 Patient presents today for chemotherapy infusion.  Patient is in satisfactory condition with no new complaints voiced.  Vital signs are stable.  Labs from 01/23/2024 reviewed.  ANC is 1.2.  Dr. Davonna aware.  All other labs are within treatment parameters.  We will proceed with treatment per MD orders.    Patient tolerated treatment well with no complaints voiced.  Patient left ambulatory with wife in stable condition.  Vital signs stable at discharge.  Follow up as scheduled.

## 2024-01-24 NOTE — Patient Instructions (Signed)
 [facility] - A DEPT OF [facility]  Discharge Instructions: Thank you for choosing [facility] to provide your oncology and hematology care.  If you have a lab appointment with the Cancer Center - please note that after April 8th, 2024, all labs will be drawn in the cancer center.  You do not have to check in or register with the main entrance as you have in the past but will complete your check-in in the cancer center.  Wear comfortable clothing and clothing appropriate for easy access to any Portacath or PICC line.   We strive to give you quality time with your provider. You may need to reschedule your appointment if you arrive late (15 or more minutes).  Arriving late affects you and other patients whose appointments are after yours.  Also, if you miss three or more appointments without notifying the office, you may be dismissed from the clinic at the provider's discretion.      For prescription refill requests, have your pharmacy contact our office and allow 72 hours for refills to be completed.    Today you received the following chemotherapy and/or immunotherapy agents Oxaliplatin .  Oxaliplatin  Injection What is this medication? OXALIPLATIN  (ox AL i PLA tin) treats colorectal cancer. It works by slowing down the growth of cancer cells. This medicine may be used for other purposes; ask your health care provider or pharmacist if you have questions. COMMON BRAND NAME(S): Eloxatin  What should I tell my care team before I take this medication? They need to know if you have any of these conditions: Heart disease History of irregular heartbeat or rhythm Liver disease Low blood cell levels (white cells, red cells, and platelets) Lung or breathing disease, such as asthma Take medications that treat or prevent blood clots Tingling of the fingers, toes, or other nerve disorder An unusual or allergic reaction to oxaliplatin , other medications,  foods, dyes, or preservatives If you or your partner are pregnant or trying to get pregnant Breast-feeding How should I use this medication? This medication is injected into a vein. It is given by your care team in a hospital or clinic setting. Talk to your care team about the use of this medication in children. Special care may be needed. Overdosage: If you think you have taken too much of this medicine contact a poison control center or emergency room at once. NOTE: This medicine is only for you. Do not share this medicine with others. What if I miss a dose? Keep appointments for follow-up doses. It is important not to miss a dose. Call your care team if you are unable to keep an appointment. What may interact with this medication? Do not take this medication with any of the following: Cisapride Dronedarone Pimozide Thioridazine This medication may also interact with the following: Aspirin and aspirin-like medications Certain medications that treat or prevent blood clots, such as warfarin, apixaban, dabigatran, and rivaroxaban Cisplatin Cyclosporine Diuretics Medications for infection, such as acyclovir, adefovir, amphotericin B, bacitracin, cidofovir, foscarnet, ganciclovir, gentamicin, pentamidine, vancomycin NSAIDs, medications for pain and inflammation, such as ibuprofen or naproxen Other medications that cause heart rhythm changes Pamidronate Zoledronic acid This list may not describe all possible interactions. Give your health care provider a list of all the medicines, herbs, non-prescription drugs, or dietary supplements you use. Also tell them if you smoke, drink alcohol, or use illegal drugs. Some items may interact with your medicine. What should I watch for while using  this medication? Your condition will be monitored carefully while you are receiving this medication. You may need blood work while taking this medication. This medication may make you feel generally unwell.  This is not uncommon as chemotherapy can affect healthy cells as well as cancer cells. Report any side effects. Continue your course of treatment even though you feel ill unless your care team tells you to stop. This medication may increase your risk of getting an infection. Call your care team for advice if you get a fever, chills, sore throat, or other symptoms of a cold or flu. Do not treat yourself. Try to avoid being around people who are sick. Avoid taking medications that contain aspirin, acetaminophen , ibuprofen, naproxen, or ketoprofen unless instructed by your care team. These medications may hide a fever. Be careful brushing or flossing your teeth or using a toothpick because you may get an infection or bleed more easily. If you have any dental work done, tell your dentist you are receiving this medication. This medication can make you more sensitive to cold. Do not drink cold drinks or use ice. Cover exposed skin before coming in contact with cold temperatures or cold objects. When out in cold weather wear warm clothing and cover your mouth and nose to warm the air that goes into your lungs. Tell your care team if you get sensitive to the cold. Talk to your care team if you or your partner are pregnant or think either of you might be pregnant. This medication can cause serious birth defects if taken during pregnancy and for 9 months after the last dose. A negative pregnancy test is required before starting this medication. A reliable form of contraception is recommended while taking this medication and for 9 months after the last dose. Talk to your care team about effective forms of contraception. Do not father a child while taking this medication and for 6 months after the last dose. Use a condom while having sex during this time period. Do not breastfeed while taking this medication and for 3 months after the last dose. This medication may cause infertility. Talk to your care team if you are  concerned about your fertility. What side effects may I notice from receiving this medication? Side effects that you should report to your care team as soon as possible: Allergic reactions--skin rash, itching, hives, swelling of the face, lips, tongue, or throat Bleeding--bloody or black, tar-like stools, vomiting blood or Fayola Meckes material that looks like coffee grounds, red or dark Haili Donofrio urine, small red or purple spots on skin, unusual bruising or bleeding Dry cough, shortness of breath or trouble breathing Heart rhythm changes--fast or irregular heartbeat, dizziness, feeling faint or lightheaded, chest pain, trouble breathing Infection--fever, chills, cough, sore throat, wounds that don't heal, pain or trouble when passing urine, general feeling of discomfort or being unwell Liver injury--right upper belly pain, loss of appetite, nausea, light-colored stool, dark yellow or Tracia Lacomb urine, yellowing skin or eyes, unusual weakness or fatigue Low red blood cell level--unusual weakness or fatigue, dizziness, headache, trouble breathing Muscle injury--unusual weakness or fatigue, muscle pain, dark yellow or Sabine Tenenbaum urine, decrease in amount of urine Pain, tingling, or numbness in the hands or feet Sudden and severe headache, confusion, change in vision, seizures, which may be signs of posterior reversible encephalopathy syndrome (PRES) Unusual bruising or bleeding Side effects that usually do not require medical attention (report to your care team if they continue or are bothersome): Diarrhea Nausea Pain, redness, or swelling with  sores inside the mouth or throat Unusual weakness or fatigue Vomiting This list may not describe all possible side effects. Call your doctor for medical advice about side effects. You may report side effects to FDA at 1-800-FDA-1088. Where should I keep my medication? This medication is given in a hospital or clinic. It will not be stored at home. NOTE: This sheet is a  summary. It may not cover all possible information. If you have questions about this medicine, talk to your doctor, pharmacist, or health care provider.  2024 Elsevier/Gold Standard (2023-04-27 00:00:00)       To help prevent nausea and vomiting after your treatment, we encourage you to take your nausea medication as directed.  BELOW ARE SYMPTOMS THAT SHOULD BE REPORTED IMMEDIATELY: *FEVER GREATER THAN 100.4 F (38 C) OR HIGHER *CHILLS OR SWEATING *NAUSEA AND VOMITING THAT IS NOT CONTROLLED WITH YOUR NAUSEA MEDICATION *UNUSUAL SHORTNESS OF BREATH *UNUSUAL BRUISING OR BLEEDING *URINARY PROBLEMS (pain or burning when urinating, or frequent urination) *BOWEL PROBLEMS (unusual diarrhea, constipation, pain near the anus) TENDERNESS IN MOUTH AND THROAT WITH OR WITHOUT PRESENCE OF ULCERS (sore throat, sores in mouth, or a toothache) UNUSUAL RASH, SWELLING OR PAIN  UNUSUAL VAGINAL DISCHARGE OR ITCHING   Items with * indicate a potential emergency and should be followed up as soon as possible or go to the Emergency Department if any problems should occur.  Please show the CHEMOTHERAPY ALERT CARD or IMMUNOTHERAPY ALERT CARD at check-in to the Emergency Department and triage nurse.  Should you have questions after your visit or need to cancel or reschedule your appointment, please contact [facility] [place] - A DEPT [facility] [phone]  and follow the prompts.  Office hours are 8:00 a.m. to 4:30 p.m. Monday - Friday. Please note that voicemails left after 4:00 p.m. may not be returned until the following business day.  We are closed weekends and major holidays. You have access to a nurse at all times for urgent questions. Please call the main number to the clinic [phone] and follow the prompts.  For any non-urgent questions, you may also contact your provider using MyChart. We now offer e-Visits for anyone 72 and older to request care online for non-urgent  symptoms. For details visit [email or web address].   Also download the MyChart app! Go to the app store, search MyChart, open the app, select [place], and log in with your MyChart username and password.

## 2024-01-31 ENCOUNTER — Encounter: Payer: Self-pay | Admitting: Oncology

## 2024-02-05 ENCOUNTER — Encounter: Payer: Self-pay | Admitting: Hematology

## 2024-02-13 ENCOUNTER — Inpatient Hospital Stay: Attending: Oncology

## 2024-02-13 DIAGNOSIS — Z79899 Other long term (current) drug therapy: Secondary | ICD-10-CM | POA: Insufficient documentation

## 2024-02-13 DIAGNOSIS — C19 Malignant neoplasm of rectosigmoid junction: Secondary | ICD-10-CM | POA: Insufficient documentation

## 2024-02-13 DIAGNOSIS — Z5111 Encounter for antineoplastic chemotherapy: Secondary | ICD-10-CM | POA: Diagnosis present

## 2024-02-13 DIAGNOSIS — D61818 Other pancytopenia: Secondary | ICD-10-CM | POA: Insufficient documentation

## 2024-02-13 DIAGNOSIS — C20 Malignant neoplasm of rectum: Secondary | ICD-10-CM

## 2024-02-13 LAB — CBC WITH DIFFERENTIAL/PLATELET
Abs Immature Granulocytes: 0.01 K/uL (ref 0.00–0.07)
Basophils Absolute: 0 K/uL (ref 0.0–0.1)
Basophils Relative: 1 %
Eosinophils Absolute: 0.1 K/uL (ref 0.0–0.5)
Eosinophils Relative: 3 %
HCT: 37 % — ABNORMAL LOW (ref 39.0–52.0)
Hemoglobin: 13.3 g/dL (ref 13.0–17.0)
Immature Granulocytes: 0 %
Lymphocytes Relative: 40 %
Lymphs Abs: 1.2 K/uL (ref 0.7–4.0)
MCH: 35.8 pg — ABNORMAL HIGH (ref 26.0–34.0)
MCHC: 35.9 g/dL (ref 30.0–36.0)
MCV: 99.5 fL (ref 80.0–100.0)
Monocytes Absolute: 0.5 K/uL (ref 0.1–1.0)
Monocytes Relative: 16 %
Neutro Abs: 1.2 K/uL — ABNORMAL LOW (ref 1.7–7.7)
Neutrophils Relative %: 40 %
Platelets: 142 K/uL — ABNORMAL LOW (ref 150–400)
RBC: 3.72 MIL/uL — ABNORMAL LOW (ref 4.22–5.81)
RDW: 16.9 % — ABNORMAL HIGH (ref 11.5–15.5)
WBC: 2.9 K/uL — ABNORMAL LOW (ref 4.0–10.5)
nRBC: 0 % (ref 0.0–0.2)

## 2024-02-13 LAB — COMPREHENSIVE METABOLIC PANEL WITH GFR
ALT: 29 U/L (ref 0–44)
AST: 38 U/L (ref 15–41)
Albumin: 4.1 g/dL (ref 3.5–5.0)
Alkaline Phosphatase: 89 U/L (ref 38–126)
Anion gap: 12 (ref 5–15)
BUN: 9 mg/dL (ref 6–20)
CO2: 23 mmol/L (ref 22–32)
Calcium: 9.7 mg/dL (ref 8.9–10.3)
Chloride: 99 mmol/L (ref 98–111)
Creatinine, Ser: 0.82 mg/dL (ref 0.61–1.24)
GFR, Estimated: >60 mL/min (ref >60)
Glucose, Bld: 277 mg/dL — ABNORMAL HIGH (ref 70–99)
Potassium: 4.1 mmol/L (ref 3.5–5.1)
Sodium: 134 mmol/L — ABNORMAL LOW (ref 135–145)
Total Bilirubin: 1.5 mg/dL — ABNORMAL HIGH (ref 0.0–1.2)
Total Protein: 7.6 g/dL (ref 6.5–8.1)

## 2024-02-13 LAB — MAGNESIUM: Magnesium: 1.7 mg/dL (ref 1.7–2.4)

## 2024-02-14 ENCOUNTER — Inpatient Hospital Stay

## 2024-02-14 ENCOUNTER — Encounter: Payer: Self-pay | Admitting: Oncology

## 2024-02-14 ENCOUNTER — Inpatient Hospital Stay: Admitting: Oncology

## 2024-02-14 VITALS — BP 113/78 | HR 92 | Temp 98.2°F | Resp 18 | Wt 185.0 lb

## 2024-02-14 VITALS — BP 134/91 | HR 99 | Temp 96.9°F | Resp 16

## 2024-02-14 DIAGNOSIS — C20 Malignant neoplasm of rectum: Secondary | ICD-10-CM

## 2024-02-14 DIAGNOSIS — Z5111 Encounter for antineoplastic chemotherapy: Secondary | ICD-10-CM | POA: Diagnosis not present

## 2024-02-14 DIAGNOSIS — D61818 Other pancytopenia: Secondary | ICD-10-CM | POA: Diagnosis not present

## 2024-02-14 MED ORDER — SODIUM CHLORIDE 0.9 % IV SOLN
150.0000 mg | Freq: Once | INTRAVENOUS | Status: AC
  Administered 2024-02-14: 150 mg via INTRAVENOUS
  Filled 2024-02-14: qty 150

## 2024-02-14 MED ORDER — DEXTROSE 5 % IV SOLN: INTRAVENOUS | Status: DC

## 2024-02-14 MED ORDER — PALONOSETRON HCL INJECTION 0.25 MG/5ML
0.2500 mg | Freq: Once | INTRAVENOUS | Status: AC
  Administered 2024-02-14: 0.25 mg via INTRAVENOUS
  Filled 2024-02-14: qty 5

## 2024-02-14 MED ORDER — CETIRIZINE HCL 10 MG/ML IV SOLN
10.0000 mg | Freq: Once | INTRAVENOUS | Status: AC
  Administered 2024-02-14: 10 mg via INTRAVENOUS
  Filled 2024-02-14: qty 1

## 2024-02-14 MED ORDER — OXALIPLATIN CHEMO INJECTION 100 MG/20ML
130.0000 mg/m2 | Freq: Once | INTRAVENOUS | Status: AC
  Administered 2024-02-14: 250 mg via INTRAVENOUS
  Filled 2024-02-14: qty 40

## 2024-02-14 MED ORDER — DEXAMETHASONE SODIUM PHOSPHATE 10 MG/ML IJ SOLN
10.0000 mg | Freq: Once | INTRAMUSCULAR | Status: AC
  Administered 2024-02-14: 10 mg via INTRAVENOUS
  Filled 2024-02-14: qty 1

## 2024-02-14 NOTE — Patient Instructions (Signed)
 [facility] - A DEPT OF [facility]  Discharge Instructions: Thank you for choosing [facility] to provide your oncology and hematology care.  If you have a lab appointment with the Cancer Center - please note that after April 8th, 2024, all labs will be drawn in the cancer center.  You do not have to check in or register with the main entrance as you have in the past but will complete your check-in in the cancer center.  Wear comfortable clothing and clothing appropriate for easy access to any Portacath or PICC line.   We strive to give you quality time with your provider. You may need to reschedule your appointment if you arrive late (15 or more minutes).  Arriving late affects you and other patients whose appointments are after yours.  Also, if you miss three or more appointments without notifying the office, you may be dismissed from the clinic at the provider's discretion.      For prescription refill requests, have your pharmacy contact our office and allow 72 hours for refills to be completed.    Today you received the following chemotherapy and/or immunotherapy agents Oxaliplatin      To help prevent nausea and vomiting after your treatment, we encourage you to take your nausea medication as directed.  BELOW ARE SYMPTOMS THAT SHOULD BE REPORTED IMMEDIATELY: *FEVER GREATER THAN 100.4 F (38 C) OR HIGHER *CHILLS OR SWEATING *NAUSEA AND VOMITING THAT IS NOT CONTROLLED WITH YOUR NAUSEA MEDICATION *UNUSUAL SHORTNESS OF BREATH *UNUSUAL BRUISING OR BLEEDING *URINARY PROBLEMS (pain or burning when urinating, or frequent urination) *BOWEL PROBLEMS (unusual diarrhea, constipation, pain near the anus) TENDERNESS IN MOUTH AND THROAT WITH OR WITHOUT PRESENCE OF ULCERS (sore throat, sores in mouth, or a toothache) UNUSUAL RASH, SWELLING OR PAIN  UNUSUAL VAGINAL DISCHARGE OR ITCHING   Items with * indicate a potential emergency and should be followed  up as soon as possible or go to the Emergency Department if any problems should occur.  Please show the CHEMOTHERAPY ALERT CARD or IMMUNOTHERAPY ALERT CARD at check-in to the Emergency Department and triage nurse.  Should you have questions after your visit or need to cancel or reschedule your appointment, please contact [facility] [place] - A DEPT OF Eligha Bridegroom [facility] [phone]  and follow the prompts.  Office hours are 8:00 a.m. to 4:30 p.m. Monday - Friday. Please note that voicemails left after 4:00 p.m. may not be returned until the following business day.  We are closed weekends and major holidays. You have access to a nurse at all times for urgent questions. Please call the main number to the clinic [phone] and follow the prompts.  For any non-urgent questions, you may also contact your provider using MyChart. We now offer e-Visits for anyone 60 and older to request care online for non-urgent symptoms. For details visit [email or web address].   Also download the MyChart app! Go to the app store, search "MyChart", open the app, select [place], and log in with your MyChart username and password.

## 2024-02-14 NOTE — Progress Notes (Signed)
 Patient presents today for Oxaliplatin  infusion per providers order.  Vital signs and labs reviewed by MD.  Message received from Isaiah Piety RN/Dr. Davonna, patient okay for treatment with ANC of 1.2.  Treatment given today per MD orders.  Stable during infusion without adverse affects.  Vital signs stable.  No complaints at this time.  Discharge from clinic ambulatory in stable condition.  Alert and oriented X 3.  Follow up with [facility] as scheduled.

## 2024-02-14 NOTE — Patient Instructions (Signed)

## 2024-02-14 NOTE — Progress Notes (Signed)
 Patient Care Team: Murphy, Randall A, MD as PCP - General (Family Medicine) Rourk, Randall HERO, MD as Consulting Physician (Gastroenterology) Randall Ross Lacy Phebe, MD as Referring Physician (Optometry)  Clinic Day:  02/14/2024  Referring physician: Bobbette Murphy LABOR, MD   CHIEF COMPLAINT:  CC: Stage III (T3 N1) recto-sigmoid colon adenocarcinoma, MMR preserved   Randall Murphy 43 y.o. male was transferred to my care after his prior physician has left.   ASSESSMENT & PLAN:   Assessment & Plan: Randall Murphy  is a 43 y.o. male with stage III (T3 N1) recto-sigmoid colon adenocarcinoma, MMR preserved  Assessment & Plan Rectal adenocarcinoma (HCC) Stage III rectosigmoid colon adenocarcinoma, MMR preserved Oncology history as below As per previous documentation, previous oncologist spoke to the surgeon and confirmed that the tumor location was mostly in the distal sigmoid region.  Also confirmed that all the lymph nodes were taken out and recommended to avoid radiation Total 8 cycles of adjuvant CapeOx planned. Last cycle today  - Day 1 cycle 8 today.  Tolerated previous cycles well. -Labs reviewed today: CMP: Normal creatinine, normal LFTs, CBC: WBC: 2.9, ANC: 1200, hemoglobin: 13.3, platelets: 142.  Last CEA: 6.3 -Physical exam stable today.  Proceed with chemotherapy today. - Continue current dose of capecitabine  at 2000 mg twice daily to be taken 14 days on/7 days off. Will start today. -Upon completion of 8 cycles.  Will do surveillance per NCCN guidelines:  -History and physical exam every 3 to 6 months for 2 years, then every 6 months for a total of 5 years  -CEA every 3 to 6 months for 2 years, then every 6 months for a total of 5 years  -CT CAP every 6 to 12 months for a total of 5 years  -Colonoscopy in 1 year after surgery -11/21/2023: Signatera: Negative.  Will repeat every 6 months. Due 04/2024 - CEA slightly trending up.  Will repeat in 3 months. - Patient is scheduled to see  a surgeon for sigmoidoscopy in October - Will obtain a posttreatment CT CAP in 2 months  Return to clinic  in 2 months with CT CAP (posttreatment) Pancytopenia (HCC) Likely secondary to myelosuppression from chemotherapy. Should improve with stopping chemotherapy  - Continue to monitor for now    The patient understands the plans discussed today and is in agreement with them.  He knows to contact our office if he develops concerns prior to his next appointment.  30 minutes of total time was spent for this patient encounter, including preparation, face-to-face counseling with the patient and coordination of care, physical exam, and documentation of the encounter.   Randall Murphy,acting as a Neurosurgeon for Randall Dry, MD.,have documented all relevant documentation on the behalf of Randall Dry, MD,as directed by  Randall Dry, MD while in the presence of Randall Dry, MD.  I, Randall Dry MD, have reviewed the above documentation for accuracy and completeness, and I agree with the above.      Randall Dry, MD  [facility] [place] - A DEPT OF [facility] [address] [place] [address] Dept: [phone] Dept Fax: [phone]   Orders Placed This Encounter  Procedures   CT CHEST ABDOMEN PELVIS W CONTRAST    Standing Status:   Future    Expected Date:   03/15/2024    Expiration Date:   02/13/2025    If indicated for the ordered procedure, I authorize the administration  of contrast media per Radiology protocol:   Yes    Does the patient have a contrast media/X-ray dye allergy?:   No    Preferred imaging location?:   [facility]    Release to patient:   Immediate    If indicated for the ordered procedure, I authorize the administration of oral contrast media per Radiology protocol:   Yes   CBC with Differential    Standing Status:   Future    Expected Date:   03/10/2024    Expiration Date:    06/08/2024   Comprehensive metabolic panel    Standing Status:   Future    Expected Date:   03/10/2024    Expiration Date:   06/08/2024   CEA    Standing Status:   Future    Expected Date:   03/10/2024    Expiration Date:   06/08/2024   Signatera    Select as applicable. If patient is on or planning to receive immunotherapy, select drug: Not on Immunotherapy If Other or Multiple, Write down drug name:  Do not Delete Below This Line   ==========Department Information========== ID: [id number] Department:[facility] [place] - A DEPT OF [facility] [address] [place] [address] Dept: [phone] Dept Fax: [phone]    Standing Status:   Future    Expected Date:   03/15/2024    Expiration Date:   02/13/2025    Jennell to follow up with patient for sample collection (mobile phleb, lab, or saliva)::   No    Surveillance Program draw frequency::   Every 3 Months    Surveillance Program draw count::   4    Cancer type::   Colon    Stage of diagnosis::   III    History of recurrence?:   No    Current disease status::   No evidence of disease    Patient status::   Outpatient    By placing this electronic order I confirm the testing ordered herein is medically necessary and this patient has been informed of the details of the genetic test(s) ordered, including the risks, benefits, and alternatives, and has consented to testing.:   Yes    What type of billing?:   [employer]     ONCOLOGY HISTORY:   I have reviewed his chart and materials related to his cancer extensively and collaborated history with the patient. Summary of oncologic history is as follows:   Diagnosis: Stage III (T3 N1) recto-sigmoid colon adenocarcinoma, MMR preserved   -Presentation: Rectal bleeding -07/19/2023: Colonoscopy found: Mild pancolonic diverticulosis. A single sessile 1 cm nonbleeding polyp of benign appearance found in the transverse  colon. An ulcerated bleeding 8 cm mass of malignant appearance was found in the sigmoid colon. Mass caused partial obstruction.   -07/20/2023: CEA: 39.4.  -07/25/2023: CT CAP: Mass within the sigmoid colon measures approximately 3.0 x 2.1 cm suspicious for malignancy. There are a few mildly enlarged lymph nodes adjacent to the sigmoid colon. Index partially necrotic lymph node measures 1.7 x 1.2 cm. Tiny left upper lobe pulmonary nodule  -08/23/2023: Robotic LAR at [facility] Pathology of sigmoid colon resection: Invasive colonic adenocarcinoma, moderately differentiated, arising in a tubulovillous adenoma. Tumor size 6.8 x 5.6 cm. Tumor location: Distal sigmoid/rectosigmoid colon. 1/31 lymph nodes positive for malignancy. All margins negative. Perineural invasion present. No lymphovascular invasion identified. MMR preserved. Pathology of distal and proximal donut excisions were negative for malignancy.  -  As per previous documentation, previous oncologist spoke to the surgeon and confirmed that the tumor location was mostly in the distal sigmoid region.  Also confirmed that all the lymph nodes were taken out and recommended to avoid radiation -Cancer next expanded +RNA insight panel [name]): Negative -09/05/2023: CEA: 10.6 -09/07/2023: IR guided port placement -09/20/2023 - current: CapeOx -10/10/2023: CEA: 4.5 -11/21/2023: Signatera: Negative -01/23/2024: CEA: 6.3- elevated  Current Treatment: Adjuvant capecitabine + oxaliplatin   INTERVAL HISTORY:   Randall Murphy is here today for follow up. Patient is accompanied by his wife today.    He is tolerating chemotherapy well and will complete his last cycle of treatment today. He notes a Murphy mouth after his last treatment. Randall Murphy also reports worsened peripheral neuropathy in the form of constant numbness and tingling in the fingertips of both hands, as well as numbness and tingling in the lower extremities but is not restricting him with daily  activities. His lower extremity neuropathy was present before starting chemotherapy. He denies any nausea, vomiting, or diarrhea.   Mekai will start last cycle of capecitabine  today.   I have reviewed the past medical history, past surgical history, social history and family history with the patient and they are unchanged from previous note.  ALLERGIES:  has no known allergies.  MEDICATIONS:  Current Outpatient Medications  Medication Sig Dispense Refill   baclofen  (LIORESAL ) 10 MG tablet Take 1 tablet (10 mg total) by mouth 3 (three) times daily as needed for muscle spasms. 1 tablet 3 times daily as needed for hiccups 30 each 0   buPROPion (WELLBUTRIN XL) 150 MG 24 hr tablet Take 150 mg by mouth.     capecitabine  (XELODA ) 500 MG tablet TAKE 4 TABLETS BY MOUTH 2 TIMES A DAY AFTER A MEAL FOR 14 DAYS ON, THEN 7 DAYS OFF. 112 tablet 5   Continuous Blood Gluc Sensor (FREESTYLE LIBRE 3 SENSOR) MISC 1 Device by Does not apply route every 14 (fourteen) days. Place 1 sensor on the skin every 14 days. Use to check glucose continuously 2 each 2   escitalopram  (LEXAPRO ) 10 MG tablet Take 1 tablet (10 mg total) by mouth daily. (NEEDS TO BE SEEN BEFORE NEXT REFILL) 30 tablet 0   lidocaine -prilocaine  (EMLA ) cream Apply to affected area once 30 g 3   lisinopril  (ZESTRIL ) 10 MG tablet Take 1 tablet (10 mg total) by mouth daily. 90 tablet 3   oxyCODONE (OXY IR/ROXICODONE) 5 MG immediate release tablet Take 5 mg by mouth every 4 (four) hours as needed.     pravastatin  (PRAVACHOL ) 40 MG tablet Take 1 tablet (40 mg total) by mouth daily. (NEEDS TO BE SEEN BEFORE NEXT REFILL) 30 tablet 0   prochlorperazine  (COMPAZINE ) 10 MG tablet Take 1 tablet (10 mg total) by mouth every 6 (six) hours as needed for nausea or vomiting. 60 tablet 3   Semaglutide , 2 MG/DOSE, (OZEMPIC , 2 MG/DOSE,) 8 MG/3ML SOPN Inject 2 mg into the skin once a week. (NEEDS TO BE SEEN BEFORE NEXT REFILL) 3 mL 0   VYVANSE  40 MG capsule Take by mouth.      No current facility-administered medications for this visit.   Facility-Administered Medications Ordered in Other Visits  Medication Dose Route Frequency Provider Last Rate Last Admin   dextrose  5 % solution   Intravenous Continuous Davonna Siad, MD 10 mL/hr at 02/14/24 0910 New Bag at 02/14/24 0910    REVIEW OF SYSTEMS:   Constitutional: Denies fevers, chills or abnormal weight loss Eyes: Denies blurriness  of vision Ears, nose, mouth, throat, and face: Denies mucositis or sore throat Respiratory: Denies cough, dyspnea or wheezes Cardiovascular: Denies palpitation, chest discomfort or lower extremity swelling Gastrointestinal:  Denies nausea, heartburn or change in bowel habits Skin: Denies abnormal skin rashes Lymphatics: Denies new lymphadenopathy or easy bruising Neurological:Denies numbness, tingling or new weaknesses Behavioral/Psych: Mood is stable, no new changes  All other systems were reviewed with the patient and are negative.   VITALS:  Blood pressure 113/78, pulse 92, temperature 98.2 F (36.8 C), temperature source Oral, resp. rate 18, weight 185 lb (83.9 kg), SpO2 100%.  Wt Readings from Last 3 Encounters:  02/14/24 185 lb (83.9 kg)  01/23/24 189 lb 13.1 oz (86.1 kg)  01/18/24 190 lb 11.2 oz (86.5 kg)    Body mass index is 27.32 kg/m.  Performance status (ECOG): 0 - Asymptomatic  PHYSICAL EXAM:   GENERAL:alert, no distress and comfortable SKIN: skin color, texture, turgor are normal, no rashes or significant lesions LUNGS: clear to auscultation and percussion with normal breathing effort HEART: regular rate & rhythm and no murmurs and no lower extremity edema ABDOMEN:abdomen soft, non-tender and normal bowel sounds Musculoskeletal:no cyanosis of digits and no clubbing  NEURO: alert & oriented x 3 with fluent speech, no focal motor/sensory deficits  LABORATORY DATA:  I have reviewed the data as listed    Component Value Date/Time   NA 134 (L)  02/13/2024 1006   NA 137 05/18/2022 0848   K 4.1 02/13/2024 1006   CL 99 02/13/2024 1006   CO2 23 02/13/2024 1006   GLUCOSE 277 (H) 02/13/2024 1006   BUN 9 02/13/2024 1006   BUN 6 05/18/2022 0848   CREATININE 0.82 02/13/2024 1006   CALCIUM  9.7 02/13/2024 1006   PROT 7.6 02/13/2024 1006   PROT 7.6 05/18/2022 0848   ALBUMIN 4.1 02/13/2024 1006   ALBUMIN 4.7 05/18/2022 0848   AST 38 02/13/2024 1006   ALT 29 02/13/2024 1006   ALKPHOS 89 02/13/2024 1006   BILITOT 1.5 (H) 02/13/2024 1006   BILITOT 0.8 05/18/2022 0848   GFRNONAA >60 02/13/2024 1006   GFRAA 102 01/22/2020 0924    Lab Results  Component Value Date   WBC 2.9 (L) 02/13/2024   NEUTROABS 1.2 (L) 02/13/2024   HGB 13.3 02/13/2024   HCT 37.0 (L) 02/13/2024   MCV 99.5 02/13/2024   PLT 142 (L) 02/13/2024     Chemistry      Component Value Date/Time   NA 134 (L) 02/13/2024 1006   NA 137 05/18/2022 0848   K 4.1 02/13/2024 1006   CL 99 02/13/2024 1006   CO2 23 02/13/2024 1006   BUN 9 02/13/2024 1006   BUN 6 05/18/2022 0848   CREATININE 0.82 02/13/2024 1006      Component Value Date/Time   CALCIUM  9.7 02/13/2024 1006   ALKPHOS 89 02/13/2024 1006   AST 38 02/13/2024 1006   ALT 29 02/13/2024 1006   BILITOT 1.5 (H) 02/13/2024 1006   BILITOT 0.8 05/18/2022 0848      Latest Reference Range & Units 09/05/23 09:21  Iron 45 - 182 ug/dL 866  UIBC ug/dL 669  TIBC 749 - 549 ug/dL 536 (H)  Saturation Ratios 17.9 - 39.5 % 29  Ferritin 24 - 336 ng/mL 299  (H): Data is abnormally high   Latest Reference Range & Units 01/23/24 08:29  CEA 0.0 - 4.7 ng/mL 6.3 (H)  (H): Data is abnormally high  RADIOGRAPHIC STUDIES: I have personally reviewed the  radiological images as listed and agreed with the findings in the report.  None new to review

## 2024-02-14 NOTE — Assessment & Plan Note (Addendum)
 Likely secondary to myelosuppression from chemotherapy. Should improve with stopping chemotherapy  - Continue to monitor for now

## 2024-02-14 NOTE — Progress Notes (Signed)
 Patient has been examined by Dr. Davonna. Vital signs and labs have been reviewed by MD - ANC (1.2), Creatinine, LFTs, hemoglobin, and platelets have been reviewed by M.D. - pt may proceed with treatment.  Primary RN and pharmacy notified.

## 2024-02-14 NOTE — Assessment & Plan Note (Addendum)
 Stage III rectosigmoid colon adenocarcinoma, MMR preserved Oncology history as below As per previous documentation, previous oncologist spoke to the surgeon and confirmed that the tumor location was mostly in the distal sigmoid region.  Also confirmed that all the lymph nodes were taken out and recommended to avoid radiation Total 8 cycles of adjuvant CapeOx planned. Last cycle today  - Day 1 cycle 8 today.  Tolerated previous cycles well. -Labs reviewed today: CMP: Normal creatinine, normal LFTs, CBC: WBC: 2.9, ANC: 1200, hemoglobin: 13.3, platelets: 142.  Last CEA: 6.3 -Physical exam stable today.  Proceed with chemotherapy today. - Continue current dose of capecitabine  at 2000 mg twice daily to be taken 14 days on/7 days off. Will start today. -Upon completion of 8 cycles.  Will do surveillance per NCCN guidelines:  -History and physical exam every 3 to 6 months for 2 years, then every 6 months for a total of 5 years  -CEA every 3 to 6 months for 2 years, then every 6 months for a total of 5 years  -CT CAP every 6 to 12 months for a total of 5 years  -Colonoscopy in 1 year after surgery -11/21/2023: Signatera: Negative.  Will repeat every 6 months. Due 04/2024 - CEA slightly trending up.  Will repeat in 3 months. - Patient is scheduled to see a surgeon for sigmoidoscopy in October - Will obtain a posttreatment CT CAP in 2 months  Return to clinic  in 2 months with CT CAP (posttreatment)

## 2024-02-15 ENCOUNTER — Other Ambulatory Visit: Payer: Self-pay

## 2024-04-02 ENCOUNTER — Other Ambulatory Visit: Payer: Self-pay | Admitting: Oncology

## 2024-04-04 ENCOUNTER — Inpatient Hospital Stay: Attending: Oncology

## 2024-04-04 ENCOUNTER — Ambulatory Visit (HOSPITAL_COMMUNITY)
Admission: RE | Admit: 2024-04-04 | Discharge: 2024-04-04 | Disposition: A | Discharge status: Home / Self Care | Source: Ambulatory Visit | Attending: Oncology | Admitting: Oncology

## 2024-04-04 DIAGNOSIS — R918 Other nonspecific abnormal finding of lung field: Secondary | ICD-10-CM | POA: Insufficient documentation

## 2024-04-04 DIAGNOSIS — K5730 Diverticulosis of large intestine without perforation or abscess without bleeding: Secondary | ICD-10-CM | POA: Insufficient documentation

## 2024-04-04 DIAGNOSIS — I700 Atherosclerosis of aorta: Secondary | ICD-10-CM | POA: Insufficient documentation

## 2024-04-04 DIAGNOSIS — C20 Malignant neoplasm of rectum: Secondary | ICD-10-CM | POA: Insufficient documentation

## 2024-04-04 DIAGNOSIS — D369 Benign neoplasm, unspecified site: Secondary | ICD-10-CM | POA: Insufficient documentation

## 2024-04-04 DIAGNOSIS — K635 Polyp of colon: Secondary | ICD-10-CM | POA: Insufficient documentation

## 2024-04-04 DIAGNOSIS — I2510 Atherosclerotic heart disease of native coronary artery without angina pectoris: Secondary | ICD-10-CM | POA: Insufficient documentation

## 2024-04-04 DIAGNOSIS — D61818 Other pancytopenia: Secondary | ICD-10-CM | POA: Insufficient documentation

## 2024-04-04 DIAGNOSIS — E669 Obesity, unspecified: Secondary | ICD-10-CM | POA: Insufficient documentation

## 2024-04-04 DIAGNOSIS — C19 Malignant neoplasm of rectosigmoid junction: Secondary | ICD-10-CM | POA: Insufficient documentation

## 2024-04-04 DIAGNOSIS — G629 Polyneuropathy, unspecified: Secondary | ICD-10-CM | POA: Insufficient documentation

## 2024-04-04 DIAGNOSIS — Z79899 Other long term (current) drug therapy: Secondary | ICD-10-CM | POA: Insufficient documentation

## 2024-04-04 DIAGNOSIS — K7460 Unspecified cirrhosis of liver: Secondary | ICD-10-CM | POA: Insufficient documentation

## 2024-04-04 DIAGNOSIS — Z9221 Personal history of antineoplastic chemotherapy: Secondary | ICD-10-CM | POA: Insufficient documentation

## 2024-04-04 DIAGNOSIS — R161 Splenomegaly, not elsewhere classified: Secondary | ICD-10-CM | POA: Insufficient documentation

## 2024-04-04 DIAGNOSIS — R599 Enlarged lymph nodes, unspecified: Secondary | ICD-10-CM | POA: Insufficient documentation

## 2024-04-04 LAB — CBC WITH DIFFERENTIAL/PLATELET
Abs Immature Granulocytes: 0 K/uL (ref 0.00–0.07)
Basophils Absolute: 0 K/uL (ref 0.0–0.1)
Basophils Relative: 1 %
Eosinophils Absolute: 0.1 K/uL (ref 0.0–0.5)
Eosinophils Relative: 4 %
HCT: 41.5 % (ref 39.0–52.0)
Hemoglobin: 14.2 g/dL (ref 13.0–17.0)
Immature Granulocytes: 0 %
Lymphocytes Relative: 37 %
Lymphs Abs: 1.1 K/uL (ref 0.7–4.0)
MCH: 33.1 pg (ref 26.0–34.0)
MCHC: 34.2 g/dL (ref 30.0–36.0)
MCV: 96.7 fL (ref 80.0–100.0)
Monocytes Absolute: 0.3 K/uL (ref 0.1–1.0)
Monocytes Relative: 10 %
Neutro Abs: 1.4 K/uL — ABNORMAL LOW (ref 1.7–7.7)
Neutrophils Relative %: 48 %
Platelets: 142 K/uL — ABNORMAL LOW (ref 150–400)
RBC: 4.29 MIL/uL (ref 4.22–5.81)
RDW: 13.5 % (ref 11.5–15.5)
WBC: 3 K/uL — ABNORMAL LOW (ref 4.0–10.5)
nRBC: 0 % (ref 0.0–0.2)

## 2024-04-04 LAB — COMPREHENSIVE METABOLIC PANEL WITH GFR
ALT: 27 U/L (ref 0–44)
AST: 37 U/L (ref 15–41)
Albumin: 4.3 g/dL (ref 3.5–5.0)
Alkaline Phosphatase: 105 U/L (ref 38–126)
Anion gap: 12 (ref 5–15)
BUN: 6 mg/dL (ref 6–20)
CO2: 23 mmol/L (ref 22–32)
Calcium: 9.3 mg/dL (ref 8.9–10.3)
Chloride: 100 mmol/L (ref 98–111)
Creatinine, Ser: 0.84 mg/dL (ref 0.61–1.24)
GFR, Estimated: >60 mL/min (ref >60)
Glucose, Bld: 244 mg/dL — ABNORMAL HIGH (ref 70–99)
Potassium: 4.2 mmol/L (ref 3.5–5.1)
Sodium: 135 mmol/L (ref 135–145)
Total Bilirubin: 0.8 mg/dL (ref 0.0–1.2)
Total Protein: 7.1 g/dL (ref 6.5–8.1)

## 2024-04-04 MED ORDER — HEPARIN SOD (PORK) LOCK FLUSH 100 UNIT/ML IV SOLN
500.0000 [IU] | Freq: Once | INTRAVENOUS | Status: AC
  Administered 2024-04-04: 500 [IU] via INTRAVENOUS

## 2024-04-04 MED ORDER — IOHEXOL 300 MG/ML  SOLN
100.0000 mL | Freq: Once | INTRAMUSCULAR | Status: AC | PRN
  Administered 2024-04-04: 100 mL via INTRAVENOUS

## 2024-04-04 NOTE — Patient Instructions (Signed)
 [facility] [place] - A DEPT OF [facility]  Discharge Instructions: Thank you for choosing [facility] to provide your oncology and hematology care.  If you have a lab appointment with the Cancer Center - please note that after April 8th, 2024, all labs will be drawn in the cancer center.  You do not have to check in or register with the main entrance as you have in the past but will complete your check-in in the cancer center.  Wear comfortable clothing and clothing appropriate for easy access to any Portacath or PICC line.   We strive to give you quality time with your provider. You may need to reschedule your appointment if you arrive late (15 or more minutes).  Arriving late affects you and other patients whose appointments are after yours.  Also, if you miss three or more appointments without notifying the office, you may be dismissed from the clinic at the provider's discretion.      For prescription refill requests, have your pharmacy contact our office and allow 72 hours for refills to be completed.    Today you received the following Port flushed and left accessed for CT scan, return as scheduled.   To help prevent nausea and vomiting after your treatment, we encourage you to take your nausea medication as directed.  BELOW ARE SYMPTOMS THAT SHOULD BE REPORTED IMMEDIATELY: *FEVER GREATER THAN 100.4 F (38 C) OR HIGHER *CHILLS OR SWEATING *NAUSEA AND VOMITING THAT IS NOT CONTROLLED WITH YOUR NAUSEA MEDICATION *UNUSUAL SHORTNESS OF BREATH *UNUSUAL BRUISING OR BLEEDING *URINARY PROBLEMS (pain or burning when urinating, or frequent urination) *BOWEL PROBLEMS (unusual diarrhea, constipation, pain near the anus) TENDERNESS IN MOUTH AND THROAT WITH OR WITHOUT PRESENCE OF ULCERS (sore throat, sores in mouth, or a toothache) UNUSUAL RASH, SWELLING OR PAIN  UNUSUAL VAGINAL DISCHARGE OR ITCHING   Items with * indicate a potential emergency and should be  followed up as soon as possible or go to the Emergency Department if any problems should occur.  Please show the CHEMOTHERAPY ALERT CARD or IMMUNOTHERAPY ALERT CARD at check-in to the Emergency Department and triage nurse.  Should you have questions after your visit or need to cancel or reschedule your appointment, please contact [facility] [place] - A DEPT OF [facility] [phone]  and follow the prompts.  Office hours are 8:00 a.m. to 4:30 p.m. Monday - Friday. Please note that voicemails left after 4:00 p.m. may not be returned until the following business day.  We are closed weekends and major holidays. You have access to a nurse at all times for urgent questions. Please call the main number to the clinic [phone] and follow the prompts.  For any non-urgent questions, you may also contact your provider using MyChart. We now offer e-Visits for anyone 80 and older to request care online for non-urgent symptoms. For details visit [email or web address].   Also download the MyChart app! Go to the app store, search MyChart, open the app, select [place], and log in with your MyChart username and password.

## 2024-04-04 NOTE — Progress Notes (Signed)
Port flushed with good blood return noted. No bruising or swelling at site. Patient discharged in satisfactory condition. VVS stable with no signs or symptoms of distressed noted. 

## 2024-04-05 LAB — CEA: CEA: 4.2 ng/mL (ref 0.0–4.7)

## 2024-04-10 NOTE — Progress Notes (Unsigned)
 Patient Care Team: Corrington, Kip A, MD as PCP - General (Family Medicine) Rourk, Lamar HERO, MD as Consulting Physician (Gastroenterology) Ladora Ross Lacy Phebe, MD as Referring Physician (Optometry)  Clinic Day:  04/11/2024  Referring physician: Bobbette Coye LABOR, MD   CHIEF COMPLAINT:  CC: Stage III (T3 N1) recto-sigmoid colon adenocarcinoma, MMR preserved    ASSESSMENT & PLAN:   Assessment & Plan: Randall Murphy  is a 43 y.o. male with stage III (T3 N1) recto-sigmoid colon adenocarcinoma, MMR preserved  Rectal adenocarcinoma Stage III rectosigmoid colon adenocarcinoma, MMR preserved Oncology history as below As per previous documentation, previous oncologist spoke to the surgeon and confirmed that the tumor location was mostly in the distal sigmoid region.  Also confirmed that all the lymph nodes were taken out and recommended to avoid radiation Total 8 cycles of adjuvant CapeOx planned. Last cycle today   - Completed 8 cycles of adj chemotherapy - Labs reviewed today from 04/04/2024: CMP: Normal creatinine, normal LFTs, CBC: WBC: 3.0, ANC: 1400, hemoglobin: 14.2, platelets: 142.  Last CEA: 4.2 - Will do surveillance per NCCN guidelines:             -History and physical exam every 3 to 6 months for 2 years, then every 6 months for a total of 5 years             -CEA every 3 to 6 months for 2 years, then every 6 months for a total of 5 years             -CT CAP every 6 to 12 months for a total of 5 years             -Colonoscopy in 1 year after surgery -11/21/2023: Signatera: Negative.  Will repeat today.  - CEA normal now.  Will repeat in 3 months. - Patient to follow up with surgeon for sigmoidoscopy  - We reviewed the CT scan findings together.  Patient has no evidence of recurrence or metastatic carcinoma at this time.  Will repeat CT scan in 3 months to follow up on the lung findings. Can transition to every 6 months after that for surveillance   Return to clinic  in 3  months with a CT scan  Pancytopenia Likely secondary to myelosuppression from chemotherapy. Should improve with stopping chemotherapy   - Continue to monitor for now  Peripheral neuropathy Likely secondary to neuropathy.  Worsened in hands and feet and also reports around the anal area  -Does not want to try any oral medications - Wants to try acupuncture.  Liver cirrhosis, etiology unspecified Liver cirrhosis noted, etiology unspecified, possibly metabolic. No alcohol use.  Splenomegaly Slight splenomegaly, possibly related to chemotherapy or liver cirrhosis. - Monitor spleen size during surveillance visits.    The patient understands the plans discussed today and is in agreement with them.  He knows to contact our office if he develops concerns prior to his next appointment.  The total time spent in the appointment was 26 minutes for the encounter with patient, including review of chart and various tests results, discussions about plan of care and coordination of care plan    [name], MD  [facility] [place] - A DEPT OF [facility] [address] [place] [address] Dept: [phone] Dept Fax: [phone]   Orders Placed This Encounter  Procedures   CT CHEST ABDOMEN PELVIS W CONTRAST    Standing Status:  Future    Expected Date:   07/12/2024    Expiration Date:   04/11/2025    If indicated for the ordered procedure, I authorize the administration of contrast media per Radiology protocol:   Yes    Does the patient have a contrast media/X-ray dye allergy?:   No    Preferred imaging location?:   [facility]    If indicated for the ordered procedure, I authorize the administration of oral contrast media per Radiology protocol:   Yes   CBC with Differential/Platelet    Standing Status:   Future    Expected Date:   07/07/2024    Expiration Date:   10/05/2024   Comprehensive metabolic panel with GFR     Standing Status:   Future    Expected Date:   07/07/2024    Expiration Date:   10/05/2024   CEA    Standing Status:   Future    Expected Date:   07/07/2024    Expiration Date:   10/05/2024     ONCOLOGY HISTORY:   I have reviewed his chart and materials related to his cancer extensively and collaborated history with the patient. Summary of oncologic history is as follows:   Diagnosis: Stage III (T3 N1) recto-sigmoid colon adenocarcinoma, MMR preserved   -Presentation: Rectal bleeding -07/19/2023: Colonoscopy found: Mild pancolonic diverticulosis. A single sessile 1 cm nonbleeding polyp of benign appearance found in the transverse colon. An ulcerated bleeding 8 cm mass of malignant appearance was found in the sigmoid colon. Mass caused partial obstruction.   -07/20/2023: CEA: 39.4.  -07/25/2023: CT CAP: Mass within the sigmoid colon measures approximately 3.0 x 2.1 cm suspicious for malignancy. There are a few mildly enlarged lymph nodes adjacent to the sigmoid colon. Index partially necrotic lymph node measures 1.7 x 1.2 cm. Tiny left upper lobe pulmonary nodule  -08/23/2023: Robotic LAR at [facility] Pathology of sigmoid colon resection: Invasive colonic adenocarcinoma, moderately differentiated, arising in a tubulovillous adenoma. Tumor size 6.8 x 5.6 cm. Tumor location: Distal sigmoid/rectosigmoid colon. 1/31 lymph nodes positive for malignancy. All margins negative. Perineural invasion present. No lymphovascular invasion identified. MMR preserved. Pathology of distal and proximal donut excisions were negative for malignancy.  -As per previous documentation, previous oncologist spoke to the surgeon and confirmed that the tumor location was mostly in the distal sigmoid region.  Also confirmed that all the lymph nodes were taken out and recommended to avoid radiation -Cancer next expanded +RNA insight panel [name]): Negative -09/05/2023: CEA: 10.6 -09/07/2023: IR guided port  placement -09/20/2023 - 03/06/2024: CapeOx. Completed 8 cycles -10/10/2023: CEA: 4.5 -11/21/2023: Signatera: Negative -01/23/2024: CEA: 6.3- elevated  Current Treatment: Adjuvant capecitabine + oxaliplatin   INTERVAL HISTORY:   Discussed the use of AI scribe software for clinical note transcription with the patient, who gave verbal consent to proceed.  History of Present Illness Randall Murphy is a 43 year old male with rectal cancer who presents for follow-up after completing chemotherapy.  He is accompanied by his wife today  He experiences numbness in his hands, feet, and perianal area following his last chemotherapy session. This numbness has worsened, affecting his typing and walking. He has not taken any medication for this but is considering non-medication options like acupuncture.  He does not want to take any oral medications.  He denies a history of alcohol use but acknowledges past obesity. He jokes about his alcohol use, indicating occasional drinking.  His white blood cell count and platelets are low. His spleen  is slightly enlarged on the recent CT scan.    I have reviewed the past medical history, past surgical history, social history and family history with the patient and they are unchanged from previous note.  ALLERGIES:  has no known allergies.  MEDICATIONS:  Current Outpatient Medications  Medication Sig Dispense Refill   baclofen  (LIORESAL ) 10 MG tablet Take 1 tablet (10 mg total) by mouth 3 (three) times daily as needed for muscle spasms. 1 tablet 3 times daily as needed for hiccups 30 each 0   buPROPion (WELLBUTRIN XL) 150 MG 24 hr tablet Take 150 mg by mouth.     capecitabine  (XELODA ) 500 MG tablet TAKE 4 TABLETS BY MOUTH 2 TIMES A DAY AFTER A MEAL FOR 14 DAYS ON, THEN 7 DAYS OFF. 112 tablet 5   Continuous Blood Gluc Sensor (FREESTYLE LIBRE 3 SENSOR) MISC 1 Device by Does not apply route every 14 (fourteen) days. Place 1 sensor on the skin every 14 days. Use to  check glucose continuously 2 each 2   escitalopram  (LEXAPRO ) 10 MG tablet Take 1 tablet (10 mg total) by mouth daily. (NEEDS TO BE SEEN BEFORE NEXT REFILL) 30 tablet 0   lisinopril  (ZESTRIL ) 10 MG tablet Take 1 tablet (10 mg total) by mouth daily. 90 tablet 3   oxyCODONE (OXY IR/ROXICODONE) 5 MG immediate release tablet Take 5 mg by mouth every 4 (four) hours as needed.     pravastatin  (PRAVACHOL ) 40 MG tablet Take 1 tablet (40 mg total) by mouth daily. (NEEDS TO BE SEEN BEFORE NEXT REFILL) 30 tablet 0   Semaglutide , 2 MG/DOSE, (OZEMPIC , 2 MG/DOSE,) 8 MG/3ML SOPN Inject 2 mg into the skin once a week. (NEEDS TO BE SEEN BEFORE NEXT REFILL) 3 mL 0   VYVANSE  40 MG capsule Take by mouth.     No current facility-administered medications for this visit.    REVIEW OF SYSTEMS:   Constitutional: Denies fevers, chills or abnormal weight loss Eyes: Denies blurriness of vision Ears, nose, mouth, throat, and face: Denies mucositis or sore throat Respiratory: Denies cough, dyspnea or wheezes Cardiovascular: Denies palpitation, chest discomfort or lower extremity swelling Gastrointestinal:  Denies nausea, heartburn or change in bowel habits Skin: Denies abnormal skin rashes Lymphatics: Denies new lymphadenopathy or easy bruising Neurological:Denies numbness, tingling or new weaknesses Behavioral/Psych: Mood is stable, no new changes  All other systems were reviewed with the patient and are negative.   VITALS:  Blood pressure (!) 126/94, pulse 82, temperature 97.8 F (36.6 C), temperature source Oral, resp. rate 16, weight 188 lb 7.9 oz (85.5 kg), SpO2 100%.  Wt Readings from Last 3 Encounters:  04/11/24 188 lb 7.9 oz (85.5 kg)  02/14/24 185 lb (83.9 kg)  01/23/24 189 lb 13.1 oz (86.1 kg)    Body mass index is 27.84 kg/m.  Performance status (ECOG): 0 - Asymptomatic  PHYSICAL EXAM:   GENERAL:alert, no distress and comfortable SKIN: skin color, texture, turgor are normal, no rashes or  significant lesions LUNGS: clear to auscultation and percussion with normal breathing effort HEART: regular rate & rhythm and no murmurs and no lower extremity edema ABDOMEN:abdomen soft, non-tender and normal bowel sounds Musculoskeletal:no cyanosis of digits and no clubbing  NEURO: alert & oriented x 3 with fluent speech  LABORATORY DATA:  I have reviewed the data as listed    Component Value Date/Time   NA 135 04/04/2024 0819   NA 137 05/18/2022 0848   K 4.2 04/04/2024 0819   CL 100 04/04/2024  0819   CO2 23 04/04/2024 0819   GLUCOSE 244 (H) 04/04/2024 0819   BUN 6 04/04/2024 0819   BUN 6 05/18/2022 0848   CREATININE 0.84 04/04/2024 0819   CALCIUM  9.3 04/04/2024 0819   PROT 7.1 04/04/2024 0819   PROT 7.6 05/18/2022 0848   ALBUMIN 4.3 04/04/2024 0819   ALBUMIN 4.7 05/18/2022 0848   AST 37 04/04/2024 0819   ALT 27 04/04/2024 0819   ALKPHOS 105 04/04/2024 0819   BILITOT 0.8 04/04/2024 0819   BILITOT 0.8 05/18/2022 0848   GFRNONAA >60 04/04/2024 0819   GFRAA 102 01/22/2020 0924    Lab Results  Component Value Date   WBC 3.0 (L) 04/04/2024   NEUTROABS 1.4 (L) 04/04/2024   HGB 14.2 04/04/2024   HCT 41.5 04/04/2024   MCV 96.7 04/04/2024   PLT 142 (L) 04/04/2024     Chemistry      Component Value Date/Time   NA 135 04/04/2024 0819   NA 137 05/18/2022 0848   K 4.2 04/04/2024 0819   CL 100 04/04/2024 0819   CO2 23 04/04/2024 0819   BUN 6 04/04/2024 0819   BUN 6 05/18/2022 0848   CREATININE 0.84 04/04/2024 0819      Component Value Date/Time   CALCIUM  9.3 04/04/2024 0819   ALKPHOS 105 04/04/2024 0819   AST 37 04/04/2024 0819   ALT 27 04/04/2024 0819   BILITOT 0.8 04/04/2024 0819   BILITOT 0.8 05/18/2022 0848      Latest Reference Range & Units 09/05/23 09:21  Iron 45 - 182 ug/dL 866  UIBC ug/dL 669  TIBC 749 - 549 ug/dL 536 (H)  Saturation Ratios 17.9 - 39.5 % 29  Ferritin 24 - 336 ng/mL 299  (H): Data is abnormally high   Latest Reference Range & Units  04/04/24 08:19  CEA 0.0 - 4.7 ng/mL 4.2    RADIOGRAPHIC STUDIES: I have personally reviewed the radiological images as listed and agreed with the findings in the report.  CT CHEST ABDOMEN PELVIS W CONTRAST CLINICAL DATA:  Colon cancer, assess treatment response * Tracking Code: BO *  EXAM: CT CHEST, ABDOMEN, AND PELVIS WITH CONTRAST  TECHNIQUE: Multidetector CT imaging of the chest, abdomen and pelvis was performed following the standard protocol during bolus administration of intravenous contrast.  RADIATION DOSE REDUCTION: This exam was performed according to the departmental dose-optimization program which includes automated exposure control, adjustment of the mA and/or kV according to patient size and/or use of iterative reconstruction technique.  CONTRAST:  100mL OMNIPAQUE IOHEXOL 300 MG/ML  SOLN  COMPARISON:  None Available.  FINDINGS: CT CHEST FINDINGS  Cardiovascular: Right chest port catheter. Normal heart size. Scattered left coronary artery calcifications. No pericardial effusion.  Mediastinum/Nodes: No enlarged mediastinal, hilar, or axillary lymph nodes. Thyroid  gland, trachea, and esophagus demonstrate no significant findings.  Lungs/Pleura: Background of fine centrilobular nodularity throughout the lungs most concentrated in the lung apices. No pleural effusion or pneumothorax.  Musculoskeletal: No chest wall abnormality. No acute osseous findings.  CT ABDOMEN PELVIS FINDINGS  Hepatobiliary: No solid liver abnormality is seen. Mildly coarse contour of the liver. Contracted gallbladder. No gallstones, gallbladder wall thickening, or biliary dilatation.  Pancreas: Unremarkable. No pancreatic ductal dilatation or surrounding inflammatory changes.  Spleen: Mild splenomegaly, maximum coronal span 14.4 cm.  Adrenals/Urinary Tract: Adrenal glands are unremarkable. Kidneys are normal, without renal calculi, solid lesion, or hydronephrosis. Bladder is  unremarkable.  Stomach/Bowel: Stomach is within normal limits. Appendix appears normal. No evidence of bowel  wall thickening, distention, or inflammatory changes. Sigmoid colon resection and reanastomosis.  Vascular/Lymphatic: Aortic atherosclerosis. No enlarged abdominal or pelvic lymph nodes.  Reproductive: No mass or other abnormality.  Other: No abdominal wall hernia or abnormality. No ascites.  Musculoskeletal: No acute osseous findings.  IMPRESSION: 1. Sigmoid colon resection and reanastomosis. 2. No evidence of recurrent or metastatic disease in the chest, abdomen, or pelvis. 3. Mildly coarse contour of the liver, suggestive of cirrhosis. 4. Mild splenomegaly. 5. Background of fine centrilobular nodularity throughout the lungs most concentrated in the lung apices, most commonly seen in smoking-related respiratory bronchiolitis. 6. Coronary artery disease.  Aortic Atherosclerosis (ICD10-I70.0).  Electronically Signed   By: Marolyn JONETTA Jaksch M.D.   On: 04/08/2024 11:03

## 2024-04-11 ENCOUNTER — Inpatient Hospital Stay

## 2024-04-11 ENCOUNTER — Inpatient Hospital Stay: Admitting: Oncology

## 2024-04-11 VITALS — BP 126/94 | HR 82 | Temp 97.8°F | Resp 16 | Wt 188.5 lb

## 2024-04-11 DIAGNOSIS — G620 Drug-induced polyneuropathy: Secondary | ICD-10-CM

## 2024-04-11 DIAGNOSIS — T451X5A Adverse effect of antineoplastic and immunosuppressive drugs, initial encounter: Secondary | ICD-10-CM

## 2024-04-11 DIAGNOSIS — C20 Malignant neoplasm of rectum: Secondary | ICD-10-CM

## 2024-04-11 DIAGNOSIS — D61818 Other pancytopenia: Secondary | ICD-10-CM

## 2024-04-18 LAB — SIGNATERA
SIGNATERA MTM READOUT: 0 MTM/ml
SIGNATERA TEST RESULT: NEGATIVE

## 2024-05-26 ENCOUNTER — Encounter: Payer: Self-pay | Admitting: *Deleted

## 2024-07-17 ENCOUNTER — Ambulatory Visit (HOSPITAL_COMMUNITY)

## 2024-07-17 ENCOUNTER — Inpatient Hospital Stay: Attending: Oncology

## 2024-07-24 ENCOUNTER — Inpatient Hospital Stay: Admitting: Oncology
